# Patient Record
Sex: Male | Born: 1945 | Race: White | Hispanic: No | Marital: Married | Smoking: Never smoker
Health system: Southern US, Community
[De-identification: ages and names within clinical notes are randomized; demographics above are authoritative.]

## PROBLEM LIST (undated history)

## (undated) DIAGNOSIS — I2692 Saddle embolus of pulmonary artery without acute cor pulmonale: Secondary | ICD-10-CM

## (undated) DIAGNOSIS — I10 Essential (primary) hypertension: Secondary | ICD-10-CM

## (undated) DIAGNOSIS — Z77090 Contact with and (suspected) exposure to asbestos: Secondary | ICD-10-CM

## (undated) DIAGNOSIS — C229 Malignant neoplasm of liver, not specified as primary or secondary: Secondary | ICD-10-CM

## (undated) DIAGNOSIS — E785 Hyperlipidemia, unspecified: Secondary | ICD-10-CM

## (undated) DIAGNOSIS — H269 Unspecified cataract: Secondary | ICD-10-CM

## (undated) DIAGNOSIS — K219 Gastro-esophageal reflux disease without esophagitis: Secondary | ICD-10-CM

## (undated) DIAGNOSIS — D649 Anemia, unspecified: Secondary | ICD-10-CM

## (undated) DIAGNOSIS — M1990 Unspecified osteoarthritis, unspecified site: Secondary | ICD-10-CM

## (undated) DIAGNOSIS — I2699 Other pulmonary embolism without acute cor pulmonale: Secondary | ICD-10-CM

## (undated) HISTORY — DX: Essential (primary) hypertension: I10

## (undated) HISTORY — DX: Unspecified cataract: H26.9

## (undated) HISTORY — DX: Saddle embolus of pulmonary artery without acute cor pulmonale: I26.92

## (undated) HISTORY — PX: RADIOFREQUENCY ABLATION LIVER TUMOR: SHX2293

## (undated) HISTORY — DX: Contact with and (suspected) exposure to asbestos: Z77.090

## (undated) HISTORY — DX: Malignant neoplasm of liver, not specified as primary or secondary: C22.9

## (undated) HISTORY — PX: CHOLECYSTECTOMY: SHX55

## (undated) HISTORY — DX: Hyperlipidemia, unspecified: E78.5

## (undated) HISTORY — PX: APPENDECTOMY: SHX54

## (undated) HISTORY — PX: OTHER SURGICAL HISTORY: SHX169

## (undated) HISTORY — PX: CARPAL TUNNEL WITH CUBITAL TUNNEL: SHX5608

## (undated) HISTORY — PX: KNEE SURGERY: SHX244

## (undated) HISTORY — DX: Other pulmonary embolism without acute cor pulmonale: I26.99

---

## 1998-04-11 ENCOUNTER — Ambulatory Visit (HOSPITAL_COMMUNITY): Admission: RE | Admit: 1998-04-11 | Discharge: 1998-04-11 | Payer: Self-pay | Admitting: Internal Medicine

## 2010-03-02 ENCOUNTER — Encounter (HOSPITAL_COMMUNITY)
Admission: RE | Admit: 2010-03-02 | Discharge: 2010-03-13 | Payer: Self-pay | Source: Home / Self Care | Attending: Oncology | Admitting: Oncology

## 2010-03-02 ENCOUNTER — Ambulatory Visit (HOSPITAL_COMMUNITY)
Admission: RE | Admit: 2010-03-02 | Discharge: 2010-03-13 | Payer: Self-pay | Source: Home / Self Care | Attending: Oncology | Admitting: Oncology

## 2010-03-05 LAB — DIFFERENTIAL
Basophils Relative: 0 % (ref 0–1)
Eosinophils Absolute: 0 10*3/uL (ref 0.0–0.7)
Eosinophils Relative: 0 % (ref 0–5)
Lymphs Abs: 1.1 10*3/uL (ref 0.7–4.0)
Monocytes Relative: 12 % (ref 3–12)

## 2010-03-05 LAB — CBC
MCH: 34.2 pg — ABNORMAL HIGH (ref 26.0–34.0)
MCV: 94.9 fL (ref 78.0–100.0)
Platelets: 228 10*3/uL (ref 150–400)
RDW: 12.5 % (ref 11.5–15.5)
WBC: 8.2 10*3/uL (ref 4.0–10.5)

## 2010-03-05 LAB — COMPREHENSIVE METABOLIC PANEL
Albumin: 4.6 g/dL (ref 3.5–5.2)
BUN: 16 mg/dL (ref 6–23)
Creatinine, Ser: 0.94 mg/dL (ref 0.4–1.5)
GFR calc Af Amer: >60 mL/min (ref >60)
Total Protein: 7.4 g/dL (ref 6.0–8.3)

## 2010-03-05 LAB — AFP TUMOR MARKER: AFP-Tumor Marker: 2.5 ng/mL (ref 0.0–8.0)

## 2010-03-05 LAB — CEA: CEA: <0.5 ng/mL (ref 0.0–5.0)

## 2010-03-05 LAB — CANCER ANTIGEN 19-9: CA 19-9: 9 U/mL — ABNORMAL LOW (ref <35.0)

## 2010-03-08 ENCOUNTER — Ambulatory Visit (HOSPITAL_COMMUNITY)
Admission: RE | Admit: 2010-03-08 | Discharge: 2010-03-08 | Payer: Self-pay | Source: Home / Self Care | Attending: Oncology | Admitting: Oncology

## 2010-03-08 LAB — GLUCOSE, CAPILLARY: Glucose-Capillary: 112 mg/dL — ABNORMAL HIGH (ref 70–99)

## 2010-03-09 ENCOUNTER — Ambulatory Visit (HOSPITAL_COMMUNITY): Admission: RE | Admit: 2010-03-09 | Payer: Self-pay | Source: Home / Self Care | Admitting: Oncology

## 2010-03-14 ENCOUNTER — Ambulatory Visit (HOSPITAL_COMMUNITY)
Admission: RE | Admit: 2010-03-14 | Discharge: 2010-03-14 | Disposition: A | Discharge status: Home / Self Care | Payer: 59 | Attending: Internal Medicine | Admitting: Internal Medicine

## 2010-03-14 ENCOUNTER — Encounter (HOSPITAL_BASED_OUTPATIENT_CLINIC_OR_DEPARTMENT_OTHER): Payer: 59 | Admitting: Internal Medicine

## 2010-03-14 ENCOUNTER — Other Ambulatory Visit (INDEPENDENT_AMBULATORY_CARE_PROVIDER_SITE_OTHER): Payer: Self-pay | Admitting: Internal Medicine

## 2010-03-14 DIAGNOSIS — K2970 Gastritis, unspecified, without bleeding: Secondary | ICD-10-CM | POA: Insufficient documentation

## 2010-03-14 DIAGNOSIS — Z79899 Other long term (current) drug therapy: Secondary | ICD-10-CM | POA: Insufficient documentation

## 2010-03-14 DIAGNOSIS — D126 Benign neoplasm of colon, unspecified: Secondary | ICD-10-CM

## 2010-03-14 DIAGNOSIS — I10 Essential (primary) hypertension: Secondary | ICD-10-CM | POA: Insufficient documentation

## 2010-03-14 DIAGNOSIS — C228 Malignant neoplasm of liver, primary, unspecified as to type: Secondary | ICD-10-CM

## 2010-03-14 DIAGNOSIS — E785 Hyperlipidemia, unspecified: Secondary | ICD-10-CM | POA: Insufficient documentation

## 2010-03-14 DIAGNOSIS — K2960 Other gastritis without bleeding: Secondary | ICD-10-CM

## 2010-03-15 LAB — H. PYLORI ANTIBODY, IGG: H Pylori IgG: <0.4 {ISR}

## 2010-03-24 NOTE — Op Note (Signed)
NAME:  Grant Cummings, Grant Cummings                ACCOUNT NO.:  [id number]  MEDICAL RECORD NO.:  [id number]           PATIENT TYPE:  O  LOCATION:  DAY                           FACILITY:  [facility]  PHYSICIAN:  Lionel December, M.D.    DATE OF BIRTH:  [birth year]  DATE OF PROCEDURE: DATE OF DISCHARGE:                              OPERATIVE REPORT   PROCEDURE:  Esophagogastroduodenoscopy followed by colonoscopy with snare polypectomy.  INDICATIONS:  Grant Cummings is 65 year old Caucasian male who has history of asbestos exposure.  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  No primary has been located.  The serum markers have been negative.  He is undergoing diagnostic EGD and a colonoscopy looking for primary.  The patient reports his last colonoscopy was over 4 years ago and was normal.  Procedures were reviewed with the patient.  Informed consent was obtained.  MEDS FOR CONSCIOUS SEDATION:  Cetacaine spray for oropharyngeal topical anesthesia, Demerol 50 mg IV, Versed 5 mg IV.  FINDINGS:  Procedure performed in endoscopy suite.  The patient's vital signs and O2 sat were monitored during the procedure and remained stable.  PROCEDURES: 1. Esophagogastroduodenoscopy.  The patient was placed in left lateral     recumbent position and Pentax videoscope was passed through     oropharynx without any difficulty into esophagus. 2. Esophagus.  Mucosa of the esophagus normal.  GE junction located at     39 cm from the incisors and revealed some focal erythema. 3. Stomach.  It was empty and distended very well by insufflation.     Folds of proximal stomach are normal.  Examination of mucosa and     body was normal.  There were few antral erosions.  Pyloric channel     was patent.  Angularis, fundus, and cardia were examined by     retroflexion of scope and were normal. 4. Duodenum.  Bulbar mucosa was normal.  Scope was passed in second     part of  duodenum where mucosa and folds were normal.  Endoscope was     withdrawn.  The patient prepared for procedure #2. 5. Colonoscopy.  The patient was placed in left lateral recumbent     position and rectal examination performed.  No abnormality noted on     external or digital exam.  Pentax videoscope was placed through     rectum and advanced under vision into sigmoid colon and beyond.     Preparation was excellent.  Scope was passed into cecum, which was     identified by appendiceal orifice and ileocecal valve.  Short     segment of GI was also examined was normal.  There was about 8 x 10     mm sessile polyp at the right edge of ileocecal valve.  This was     snared in two pieces.  Polypectomy was complete.  There were two     other sessile polyps at ascending colon, one was about 7-8 mm wide     and other one was about 12  mm.  Both of these were elevated with     saline injection and stent.  Polypectomy was complete.  Both of     these polyps were removed, the larger one with [employer].  There     were two other small polyps, one at the ascending colon, the other     one at rectum and these were coagulated using snare tip.  Rest of     the mucosa was normal.  Rectal mucosa similarly was normal.  Scope     was retroflexed to examine anorectal junction which was     unremarkable.  Endoscope was withdrawn.  Withdrawal time was 27     minutes.  The patient tolerated the procedures well.  FINAL DIAGNOSES: 1. Mild changes of reflux esophagitis, limited to GE junction. 2. Erosive antral gastritis. 3. Normal terminal ileum. 4. Three sessile polyps snared, one from the cecum, and two from     ascending colon, the largest one was about 12 mm.  Endoscopically,     did not look suspicious for carcinoma. 5. Two small polyps coagulated using snare one at the ascending colon,     second one at the rectum.  RECOMMENDATIONS: 1. Antireflux measures. 2. Pantoprazole 40 mg p.o. q.a.m. prescription  given for 35 refills. 3. No aspirin for 2 weeks. 4. We will check his H. pylori serology and I will be contacting the     patient with results of biopsy and blood test.     Lionel December, M.D.     NR/MEDQ  D:  03/14/2010  T:  03/14/2010  Job:  [id number]  cc:   Ladona Horns. Mariel Sleet, MD Fax: [phone]  Ernestina Penna, M.D. Fax: [phone]  Electronically Signed by Lionel December M.D. on 03/18/2010 01:56:05 PM

## 2012-07-09 ENCOUNTER — Other Ambulatory Visit: Payer: Self-pay

## 2012-07-09 MED ORDER — BENAZEPRIL HCL 20 MG PO TABS: 20.0000 mg | ORAL_TABLET | Freq: Every day | ORAL | Status: DC

## 2012-07-09 MED ORDER — HYDROCHLOROTHIAZIDE 25 MG PO TABS: 25.0000 mg | ORAL_TABLET | Freq: Every day | ORAL | Status: DC

## 2012-07-09 MED ORDER — PANTOPRAZOLE SODIUM 40 MG PO TBEC: 40.0000 mg | DELAYED_RELEASE_TABLET | Freq: Every day | ORAL | Status: DC

## 2012-07-09 NOTE — Telephone Encounter (Signed)
Patient last seen 11/13

## 2012-07-27 ENCOUNTER — Other Ambulatory Visit: Payer: Self-pay | Admitting: *Deleted

## 2012-07-27 MED ORDER — AMLODIPINE BESYLATE 10 MG PO TABS: 10.0000 mg | ORAL_TABLET | Freq: Every day | ORAL | Status: DC

## 2012-08-10 ENCOUNTER — Other Ambulatory Visit: Payer: Self-pay

## 2012-08-10 MED ORDER — AMLODIPINE BESYLATE 10 MG PO TABS: 10.0000 mg | ORAL_TABLET | Freq: Every day | ORAL | Status: DC

## 2012-08-10 MED ORDER — HYDROCHLOROTHIAZIDE 25 MG PO TABS: 25.0000 mg | ORAL_TABLET | Freq: Every day | ORAL | Status: DC

## 2012-08-10 MED ORDER — PANTOPRAZOLE SODIUM 40 MG PO TBEC: 40.0000 mg | DELAYED_RELEASE_TABLET | Freq: Every day | ORAL | Status: DC

## 2012-08-10 MED ORDER — BENAZEPRIL HCL 20 MG PO TABS: 20.0000 mg | ORAL_TABLET | Freq: Every day | ORAL | Status: DC

## 2012-08-10 NOTE — Telephone Encounter (Signed)
Last seen 01/02/12   DWM

## 2012-09-28 ENCOUNTER — Other Ambulatory Visit: Payer: Self-pay | Admitting: *Deleted

## 2012-09-28 MED ORDER — AMLODIPINE BESYLATE 10 MG PO TABS: 10.0000 mg | ORAL_TABLET | Freq: Every day | ORAL | Status: DC

## 2012-09-28 NOTE — Telephone Encounter (Signed)
LAST RF 11/13

## 2012-10-13 ENCOUNTER — Other Ambulatory Visit: Payer: Self-pay

## 2012-10-13 NOTE — Telephone Encounter (Signed)
Last seen 01/02/12   DWM  

## 2012-10-13 NOTE — Telephone Encounter (Signed)
Pt notified and appt given for sept 4 2014

## 2012-10-13 NOTE — Telephone Encounter (Signed)
Need to be seen. Bring all medications with appointment.

## 2012-10-15 ENCOUNTER — Ambulatory Visit (INDEPENDENT_AMBULATORY_CARE_PROVIDER_SITE_OTHER): Payer: Medicare Other | Admitting: Family Medicine

## 2012-10-15 ENCOUNTER — Encounter: Payer: Self-pay | Admitting: Family Medicine

## 2012-10-15 VITALS — BP 133/73 | HR 70 | Temp 98.4°F | Wt 214.0 lb

## 2012-10-15 DIAGNOSIS — K219 Gastro-esophageal reflux disease without esophagitis: Secondary | ICD-10-CM

## 2012-10-15 DIAGNOSIS — C229 Malignant neoplasm of liver, not specified as primary or secondary: Secondary | ICD-10-CM | POA: Insufficient documentation

## 2012-10-15 DIAGNOSIS — E785 Hyperlipidemia, unspecified: Secondary | ICD-10-CM | POA: Insufficient documentation

## 2012-10-15 DIAGNOSIS — I10 Essential (primary) hypertension: Secondary | ICD-10-CM | POA: Insufficient documentation

## 2012-10-15 MED ORDER — PANTOPRAZOLE SODIUM 40 MG PO TBEC: 40.0000 mg | DELAYED_RELEASE_TABLET | Freq: Every day | ORAL | Status: DC

## 2012-10-15 MED ORDER — AMLODIPINE BESYLATE 10 MG PO TABS: 10.0000 mg | ORAL_TABLET | Freq: Every day | ORAL | Status: DC

## 2012-10-15 MED ORDER — ATORVASTATIN CALCIUM 40 MG PO TABS: 40.0000 mg | ORAL_TABLET | Freq: Every day | ORAL | Status: DC

## 2012-10-15 MED ORDER — BENAZEPRIL HCL 20 MG PO TABS: 20.0000 mg | ORAL_TABLET | Freq: Every day | ORAL | Status: DC

## 2012-10-15 MED ORDER — HYDROCHLOROTHIAZIDE 25 MG PO TABS: 25.0000 mg | ORAL_TABLET | Freq: Every day | ORAL | Status: DC

## 2012-10-15 NOTE — Progress Notes (Signed)
Patient ID: Grant Cummings, male   DOB: 01/20/1946, 67 y.o.   MRN: [id number] SUBJECTIVE: CC: Chief Complaint  Patient presents with  . Medication Refill    refill all meds  no problems     HPI: Patient is here for follow up of hyperlipidemia: denies Headache;denies Chest Pain;denies weakness;denies Shortness of Breath and orthopnea;denies Visual changes;denies palpitations;denies cough;denies pedal edema;denies symptoms of TIA or stroke;deniesClaudication symptoms. admits to Compliance with medications; denies Problems with medications.  Past Medical History  Diagnosis Date  . Liver cancer   . Hyperlipidemia   . Hypertension    No past surgical history on file. History   Social History  . Marital Status: Married    Spouse Name: N/A    Number of Children: N/A  . Years of Education: N/A   Occupational History  . Not on file.   Social History Main Topics  . Smoking status: Never Smoker   . Smokeless tobacco: Not on file  . Alcohol Use: Not on file  . Drug Use: Not on file  . Sexual Activity: Not on file   Other Topics Concern  . Not on file   Social History Narrative  . No narrative on file   No family history on file. No current outpatient prescriptions on file prior to visit.   No current facility-administered medications on file prior to visit.   No Known Allergies  There is no immunization history on file for this patient. Prior to Admission medications   Medication Sig Start Date End Date Taking? Authorizing Provider  amLODipine (NORVASC) 10 MG tablet Take 1 tablet (10 mg total) by mouth daily. 10/15/12  Yes Ileana Ladd, MD  atorvastatin (LIPITOR) 40 MG tablet Take 1 tablet (40 mg total) by mouth daily. 10/15/12  Yes Ileana Ladd, MD  benazepril (LOTENSIN) 20 MG tablet Take 1 tablet (20 mg total) by mouth daily. 10/15/12  Yes Ileana Ladd, MD  hydrochlorothiazide (HYDRODIURIL) 25 MG tablet Take 1 tablet (25 mg total) by mouth daily. 10/15/12  Yes Ileana Ladd, MD  pantoprazole (PROTONIX) 40 MG tablet Take 1 tablet (40 mg total) by mouth daily. 10/15/12  Yes Ileana Ladd, MD     ROS: As above in the HPI. All other systems are stable or negative.  OBJECTIVE: APPEARANCE:  Patient in no acute distress.The patient appeared well nourished and normally developed. Acyanotic. Waist: VITAL SIGNS:BP 133/73  Pulse 70  Temp(Src) 98.4 F (36.9 C) (Oral)  Wt 214 lb (97.07 kg) WM  SKIN: warm and  Dry without overt rashes, tattoos and scars  HEAD and Neck: without JVD, Head and scalp: normal Eyes:No scleral icterus. Fundi normal, eye movements normal. Ears: Auricle normal, canal normal, Tympanic membranes normal, insufflation normal. Nose: normal Throat: normal Neck & thyroid: normal  CHEST & LUNGS: Chest wall: normal Lungs: Clear  CVS: Reveals the PMI to be normally located. Regular rhythm, First and Second Heart sounds are normal,  absence of murmurs, rubs or gallops. Peripheral vasculature: Radial pulses: normal Dorsal pedis pulses: normal Posterior pulses: normal  ABDOMEN:  Appearance: normal Benign, no organomegaly, no masses, no Abdominal Aortic enlargement. No Guarding , no rebound. No Bruits. Bowel sounds: normal  RECTAL: N/A GU: N/A  EXTREMETIES: nonedematous.  MUSCULOSKELETAL:  Spine: normal Joints: intact  NEUROLOGIC: oriented to time,place and person; nonfocal. Strength is normal Sensory is normal Reflexes are normal Cranial Nerves are normal.  ASSESSMENT: Hyperlipidemia - Plan: atorvastatin (LIPITOR) 40 MG tablet, CMP14+EGFR, NMR, lipoprofile  Hypertension - Plan: amLODipine (NORVASC) 10 MG tablet, hydrochlorothiazide (HYDRODIURIL) 25 MG tablet, benazepril (LOTENSIN) 20 MG tablet  Liver cancer - Plan: pantoprazole (PROTONIX) 40 MG tablet  GERD (gastroesophageal reflux disease)   PLAN: Orders Placed This Encounter  Procedures  . CMP14+EGFR  . NMR, lipoprofile   Meds ordered this encounter   Medications  . DISCONTD: amLODipine (NORVASC) 10 MG tablet    Sig: Take 10 mg by mouth daily.  Marland Kitchen DISCONTD: lisinopril (PRINIVIL,ZESTRIL) 20 MG tablet    Sig: Take 20 mg by mouth daily.  Marland Kitchen DISCONTD: atorvastatin (LIPITOR) 40 MG tablet    Sig: Take 40 mg by mouth daily.  Marland Kitchen atorvastatin (LIPITOR) 40 MG tablet    Sig: Take 1 tablet (40 mg total) by mouth daily.    Dispense:  30 tablet    Refill:  11  . amLODipine (NORVASC) 10 MG tablet    Sig: Take 1 tablet (10 mg total) by mouth daily.    Dispense:  30 tablet    Refill:  11  . pantoprazole (PROTONIX) 40 MG tablet    Sig: Take 1 tablet (40 mg total) by mouth daily.    Dispense:  30 tablet    Refill:  11  . hydrochlorothiazide (HYDRODIURIL) 25 MG tablet    Sig: Take 1 tablet (25 mg total) by mouth daily.    Dispense:  30 tablet    Refill:  11  . benazepril (LOTENSIN) 20 MG tablet    Sig: Take 1 tablet (20 mg total) by mouth daily.    Dispense:  30 tablet    Refill:  11   Discussed a healthy, balanced , lifestyle.  Return in about 4 months (around 02/14/2013) for Recheck medical problems with Dr Christell Constant.  Lexandra Rettke P. [place] Charon, M.D.

## 2012-10-17 LAB — CMP14+EGFR
ALT: 32 IU/L (ref 0–44)
AST: 30 IU/L (ref 0–40)
Albumin/Globulin Ratio: 2.4 (ref 1.1–2.5)
Albumin: 4.7 g/dL (ref 3.6–4.8)
Alkaline Phosphatase: 69 IU/L (ref 39–117)
BUN/Creatinine Ratio: 19 (ref 10–22)
BUN: 22 mg/dL (ref 8–27)
CO2: 24 mmol/L (ref 18–29)
Calcium: 9.8 mg/dL (ref 8.6–10.2)
Chloride: 98 mmol/L (ref 97–108)
Creatinine, Ser: 1.14 mg/dL (ref 0.76–1.27)
GFR calc Af Amer: 77 mL/min/{1.73_m2} (ref >59)
GFR calc non Af Amer: 67 mL/min/{1.73_m2} (ref >59)
Globulin, Total: 2 g/dL (ref 1.5–4.5)
Glucose: 102 mg/dL — ABNORMAL HIGH (ref 65–99)
Potassium: 4.2 mmol/L (ref 3.5–5.2)
Sodium: 140 mmol/L (ref 134–144)
Total Bilirubin: 0.6 mg/dL (ref 0.0–1.2)
Total Protein: 6.7 g/dL (ref 6.0–8.5)

## 2012-10-17 LAB — NMR, LIPOPROFILE
Cholesterol: 136 mg/dL (ref <200)
HDL Cholesterol by NMR: 42 mg/dL (ref >=40)
HDL Particle Number: 34.5 umol/L (ref >=30.5)
LDL Particle Number: 1214 nmol/L — ABNORMAL HIGH (ref <1000)
LDL Size: 20.1 nm — ABNORMAL LOW (ref >20.5)
LDLC SERPL CALC-MCNC: 47 mg/dL (ref <100)
LP-IR Score: 73 — ABNORMAL HIGH (ref <=45)
Small LDL Particle Number: 831 nmol/L — ABNORMAL HIGH (ref <=527)
Triglycerides by NMR: 237 mg/dL — ABNORMAL HIGH (ref <150)

## 2013-03-22 ENCOUNTER — Ambulatory Visit (INDEPENDENT_AMBULATORY_CARE_PROVIDER_SITE_OTHER): Payer: Medicare Other | Admitting: Family Medicine

## 2013-03-22 ENCOUNTER — Encounter: Payer: Self-pay | Admitting: Family Medicine

## 2013-03-22 VITALS — BP 146/89 | HR 72 | Temp 97.3°F | Ht 73.0 in | Wt 211.0 lb

## 2013-03-22 DIAGNOSIS — E785 Hyperlipidemia, unspecified: Secondary | ICD-10-CM

## 2013-03-22 DIAGNOSIS — E559 Vitamin D deficiency, unspecified: Secondary | ICD-10-CM | POA: Insufficient documentation

## 2013-03-22 DIAGNOSIS — C229 Malignant neoplasm of liver, not specified as primary or secondary: Secondary | ICD-10-CM

## 2013-03-22 DIAGNOSIS — N400 Enlarged prostate without lower urinary tract symptoms: Secondary | ICD-10-CM | POA: Insufficient documentation

## 2013-03-22 DIAGNOSIS — I10 Essential (primary) hypertension: Secondary | ICD-10-CM

## 2013-03-22 DIAGNOSIS — Z0000 Encounter for general adult medical examination without abnormal findings: Secondary | ICD-10-CM

## 2013-03-22 DIAGNOSIS — H9190 Unspecified hearing loss, unspecified ear: Secondary | ICD-10-CM | POA: Insufficient documentation

## 2013-03-22 LAB — POCT URINALYSIS DIPSTICK
Bilirubin, UA: NEGATIVE
GLUCOSE UA: NEGATIVE
Ketones, UA: NEGATIVE
Leukocytes, UA: NEGATIVE
Nitrite, UA: NEGATIVE
Protein, UA: NEGATIVE
RBC UA: NEGATIVE
Spec Grav, UA: 1.01
UROBILINOGEN UA: NEGATIVE
pH, UA: 6.5

## 2013-03-22 LAB — POCT CBC
Granulocyte percent: 76.6 %G (ref 37–80)
HEMATOCRIT: 46.6 % (ref 43.5–53.7)
HEMOGLOBIN: 14.9 g/dL (ref 14.1–18.1)
Lymph, poc: 1.2 (ref 0.6–3.4)
MCH, POC: 30.5 pg (ref 27–31.2)
MCHC: 31.9 g/dL (ref 31.8–35.4)
MCV: 95.8 fL (ref 80–97)
MPV: 7.6 fL (ref 0–99.8)
POC GRANULOCYTE: 4.9 (ref 2–6.9)
POC LYMPH %: 18.9 % (ref 10–50)
Platelet Count, POC: 226 10*3/uL (ref 142–424)
RBC: 4.9 M/uL (ref 4.69–6.13)
RDW, POC: 13.4 %
WBC: 6.4 10*3/uL (ref 4.6–10.2)

## 2013-03-22 LAB — POCT UA - MICROSCOPIC ONLY
Bacteria, U Microscopic: NEGATIVE
CASTS, UR, LPF, POC: NEGATIVE
Crystals, Ur, HPF, POC: NEGATIVE
Mucus, UA: NEGATIVE
RBC, urine, microscopic: NEGATIVE
WBC, Ur, HPF, POC: NEGATIVE
Yeast, UA: NEGATIVE

## 2013-03-22 NOTE — Patient Instructions (Addendum)
Continue current medications. Continue good therapeutic lifestyle changes which include good diet and exercise. Fall precautions discussed with patient. Schedule your flu vaccine if you haven't had it yet If you are over 66 years old - you may need Prevnar 63 or the adult Pneumonia vaccine. You will need to get a Prevnar in the fall of 2015 Continue followup with physician in [place] We will call you with the results of her lab work once those results are available Stay active, tea helps the

## 2013-03-22 NOTE — Addendum Note (Signed)
Addended by: Earlene Plater on: 03/22/2013 09:18 AM   Modules accepted: Orders

## 2013-03-22 NOTE — Progress Notes (Signed)
Subjective:    Patient ID: Grant Cummings, male    DOB: Nov 27, 1945, 68 y.o.   MRN: [id number]  HPI Pt here for follow up and management of chronic medical problems. Is being followed at [facility] in [place] for liver cancer. He has had more surgery with removal of more cancer from the liver in October 2014. He gets regular CT scans by his physician there. He looks good feels good today and comes in for his regular exam.        Patient Active Problem List   Diagnosis Date Noted  . Liver cancer   . Hyperlipidemia   . Hypertension    Outpatient Encounter Prescriptions as of 03/22/2013  Medication Sig  . amLODipine (NORVASC) 10 MG tablet Take 1 tablet (10 mg total) by mouth daily.  Marland Kitchen aspirin 81 MG tablet Take 81 mg by mouth daily.  Marland Kitchen atorvastatin (LIPITOR) 40 MG tablet Take 1 tablet (40 mg total) by mouth daily.  . benazepril (LOTENSIN) 20 MG tablet Take 1 tablet (20 mg total) by mouth daily.  Marland Kitchen co-enzyme Q-10 30 MG capsule Take 30 mg by mouth daily.  . hydrochlorothiazide (HYDRODIURIL) 25 MG tablet Take 1 tablet (25 mg total) by mouth daily.  . Omega 3 1000 MG CAPS Take 1 capsule by mouth daily.  . pantoprazole (PROTONIX) 40 MG tablet Take 1 tablet (40 mg total) by mouth daily.    Review of Systems  Constitutional: Negative.   HENT: Negative.   Eyes: Negative.   Respiratory: Negative.   Cardiovascular: Negative.   Gastrointestinal: Negative.   Endocrine: Negative.   Genitourinary: Negative.   Musculoskeletal: Negative.   Skin: Negative.   Allergic/Immunologic: Negative.   Neurological: Negative.   Hematological: Negative.   Psychiatric/Behavioral: Negative.        Objective:   Physical Exam  Nursing note and vitals reviewed. Constitutional: He is oriented to person, place, and time. He appears well-developed and well-nourished.  HENT:  Head: Normocephalic and atraumatic.  Right Ear: External ear normal.  Left Ear: External ear normal.  Nose: Nose  normal.  Mouth/Throat: Oropharynx is clear and moist. No oropharyngeal exudate.  He uses a hearing aid in the right ear.  Eyes: Conjunctivae and EOM are normal. Pupils are equal, round, and reactive to light. Right eye exhibits no discharge. Left eye exhibits no discharge. No scleral icterus.  Neck: Normal range of motion. Neck supple. No tracheal deviation present. No thyromegaly present.  No carotid bruits  Cardiovascular: Normal rate, regular rhythm, normal heart sounds and intact distal pulses.  Exam reveals no gallop and no friction rub.   No murmur heard. At 72 per minute  Pulmonary/Chest: Effort normal and breath sounds normal. No respiratory distress. He has no wheezes. He has no rales. He exhibits no tenderness.  No axillary nodes  Abdominal: Soft. Bowel sounds are normal. He exhibits no distension and no mass. There is no tenderness. There is no rebound and no guarding.  No inguinal nodes  Genitourinary: Rectum normal and penis normal.  The prostate gland is slightly enlarged without any lumps masses or nodules. The rectal exam was negative. There was no inguinal hernia. The external genitalia were normal.  Musculoskeletal: Normal range of motion. He exhibits no edema and no tenderness.  Lymphadenopathy:    He has no cervical adenopathy.  Neurological: He is alert and oriented to person, place, and time. He has normal reflexes. No cranial nerve deficit.  Skin: Skin is warm and dry. No rash  noted. No erythema. No pallor.  Psychiatric: He has a normal mood and affect. His behavior is normal. Judgment and thought content normal.   BP 146/89  Pulse 72  Temp(Src) 97.3 F (36.3 C) (Oral)  Ht $R'6\' 1"'rC$  (1.854 m)  Wt 211 lb (95.709 kg)  BMI 27.84 kg/m2        Assessment & Plan:  1. Hyperlipidemia - POCT CBC - NMR, lipoprofile  2. Hypertension - POCT CBC - BMP8+EGFR - Hepatic function panel  3. Liver cancer - POCT CBC - Hepatic function panel  4. Annual physical exam -  PSA, total and free  5. BPH (benign prostatic hyperplasia)  6. Vitamin D deficiency - Vit D  25 hydroxy (rtn osteoporosis monitoring)  7. Hearing deficit Meds ordered this encounter  Medications  . co-enzyme Q-10 30 MG capsule    Sig: Take 30 mg by mouth daily.  . Omega 3 1000 MG CAPS    Sig: Take 1 capsule by mouth daily.  Marland Kitchen aspirin 81 MG tablet    Sig: Take 81 mg by mouth daily.   Patient Instructions  Continue current medications. Continue good therapeutic lifestyle changes which include good diet and exercise. Fall precautions discussed with patient. Schedule your flu vaccine if you haven't had it yet If you are over 73 years old - you may need Prevnar 68 or the adult Pneumonia vaccine. You will need to get a Prevnar in the fall of 2015 Continue followup with physician in [place] We will call you with the results of her lab work once those results are available Stay active, tea helps the   Arrie Senate MD

## 2013-03-24 LAB — BMP8+EGFR
BUN/Creatinine Ratio: 20 (ref 10–22)
BUN: 18 mg/dL (ref 8–27)
CALCIUM: 9.5 mg/dL (ref 8.6–10.2)
CO2: 25 mmol/L (ref 18–29)
CREATININE: 0.92 mg/dL (ref 0.76–1.27)
Chloride: 102 mmol/L (ref 97–108)
GFR, EST AFRICAN AMERICAN: 99 mL/min/{1.73_m2} (ref >59)
GFR, EST NON AFRICAN AMERICAN: 86 mL/min/{1.73_m2} (ref >59)
GLUCOSE: 105 mg/dL — AB (ref 65–99)
POTASSIUM: 4 mmol/L (ref 3.5–5.2)
Sodium: 142 mmol/L (ref 134–144)

## 2013-03-24 LAB — NMR, LIPOPROFILE
Cholesterol: 127 mg/dL (ref <200)
HDL CHOLESTEROL BY NMR: 47 mg/dL (ref >=40)
HDL Particle Number: 31.2 umol/L (ref >=30.5)
LDL PARTICLE NUMBER: 1015 nmol/L — AB (ref <1000)
LDL Size: 20.5 nm — ABNORMAL LOW (ref >20.5)
LDLC SERPL CALC-MCNC: 66 mg/dL (ref <100)
LP-IR SCORE: 40 (ref <=45)
Small LDL Particle Number: 419 nmol/L (ref <=527)
Triglycerides by NMR: 69 mg/dL (ref <150)

## 2013-03-24 LAB — HEPATIC FUNCTION PANEL
ALK PHOS: 68 IU/L (ref 39–117)
ALT: 31 IU/L (ref 0–44)
AST: 27 IU/L (ref 0–40)
Albumin: 4.5 g/dL (ref 3.6–4.8)
Bilirubin, Direct: 0.17 mg/dL (ref 0.00–0.40)
Total Bilirubin: 0.6 mg/dL (ref 0.0–1.2)
Total Protein: 6.5 g/dL (ref 6.0–8.5)

## 2013-03-24 LAB — PSA, TOTAL AND FREE
PSA FREE PCT: 30 %
PSA, Free: 0.06 ng/mL
PSA: 0.2 ng/mL (ref 0.0–4.0)

## 2013-03-24 LAB — VITAMIN D 25 HYDROXY (VIT D DEFICIENCY, FRACTURES): VIT D 25 HYDROXY: 24.4 ng/mL — AB (ref 30.0–100.0)

## 2013-03-30 ENCOUNTER — Telehealth: Payer: Self-pay | Admitting: *Deleted

## 2013-03-30 NOTE — Telephone Encounter (Signed)
Message copied by Marin Olp on Tue Mar 30, 2013  1:23 PM ------      Message from: Chipper Herb      Created: Wed Mar 24, 2013  6:55 AM       The blood sugar slightly elevated at 105. The creatinine, electrolytes including potassium were within normal limits      The liver function tests are all within normal limits      On advanced lipid testing, the LDL particle number is slightly elevated at 1015, this is improved from 5 months ago. The LDL C. is good, triglycerides are good and the good cholesterol is within the normal range. Continue current treatment and as aggressive therapeutic lifestyle changes as possible      The PSA is low and within normal limits      The vitamin D level is very low at 24.4---------------------- please call in vitamin D 50,000 units #12 one weekly one refill. Have him repeat the vitamin D level in 3 months and this can be done nonfasting ------

## 2013-03-31 NOTE — Telephone Encounter (Signed)
Patient aware.

## 2013-04-16 ENCOUNTER — Ambulatory Visit: Payer: Medicare Other | Admitting: Family Medicine

## 2013-09-20 ENCOUNTER — Encounter: Payer: Self-pay | Admitting: Family Medicine

## 2013-09-20 ENCOUNTER — Ambulatory Visit (INDEPENDENT_AMBULATORY_CARE_PROVIDER_SITE_OTHER): Payer: Medicare Other | Admitting: Family Medicine

## 2013-09-20 ENCOUNTER — Encounter (INDEPENDENT_AMBULATORY_CARE_PROVIDER_SITE_OTHER): Payer: Self-pay

## 2013-09-20 VITALS — BP 141/84 | HR 69 | Temp 96.3°F | Ht 73.0 in | Wt 216.0 lb

## 2013-09-20 DIAGNOSIS — E559 Vitamin D deficiency, unspecified: Secondary | ICD-10-CM

## 2013-09-20 DIAGNOSIS — I10 Essential (primary) hypertension: Secondary | ICD-10-CM

## 2013-09-20 DIAGNOSIS — L209 Atopic dermatitis, unspecified: Secondary | ICD-10-CM

## 2013-09-20 DIAGNOSIS — L2089 Other atopic dermatitis: Secondary | ICD-10-CM

## 2013-09-20 DIAGNOSIS — E785 Hyperlipidemia, unspecified: Secondary | ICD-10-CM

## 2013-09-20 DIAGNOSIS — N400 Enlarged prostate without lower urinary tract symptoms: Secondary | ICD-10-CM

## 2013-09-20 LAB — POCT CBC
Granulocyte percent: 74.2 %G (ref 37–80)
HCT, POC: 45.1 % (ref 43.5–53.7)
Hemoglobin: 15.2 g/dL (ref 14.1–18.1)
Lymph, poc: 1 (ref 0.6–3.4)
MCH: 32.9 pg — AB (ref 27–31.2)
MCHC: 33.7 g/dL (ref 31.8–35.4)
MCV: 97.7 fL — AB (ref 80–97)
MPV: 7.2 fL (ref 0–99.8)
PLATELET COUNT, POC: 179 10*3/uL (ref 142–424)
POC Granulocyte: 3.9 (ref 2–6.9)
POC LYMPH PERCENT: 18.4 %L (ref 10–50)
RBC: 4.6 M/uL — AB (ref 4.69–6.13)
RDW, POC: 12.6 %
WBC: 5.2 10*3/uL (ref 4.6–10.2)

## 2013-09-20 MED ORDER — PANTOPRAZOLE SODIUM 40 MG PO TBEC: 40.0000 mg | DELAYED_RELEASE_TABLET | Freq: Every day | ORAL | Status: DC

## 2013-09-20 MED ORDER — FLUOCINONIDE-E 0.05 % EX CREA: 1.0000 "application " | TOPICAL_CREAM | Freq: Two times a day (BID) | CUTANEOUS | Status: DC

## 2013-09-20 MED ORDER — BENAZEPRIL HCL 20 MG PO TABS: 20.0000 mg | ORAL_TABLET | Freq: Every day | ORAL | Status: DC

## 2013-09-20 MED ORDER — HYDROCHLOROTHIAZIDE 25 MG PO TABS: 25.0000 mg | ORAL_TABLET | Freq: Every day | ORAL | Status: DC

## 2013-09-20 MED ORDER — AMLODIPINE BESYLATE 10 MG PO TABS: 10.0000 mg | ORAL_TABLET | Freq: Every day | ORAL | Status: DC

## 2013-09-20 MED ORDER — ATORVASTATIN CALCIUM 40 MG PO TABS: 40.0000 mg | ORAL_TABLET | Freq: Every day | ORAL | Status: DC

## 2013-09-20 NOTE — Progress Notes (Signed)
Subjective:    Patient ID: Grant Cummings, male    DOB: 1945/03/02, 68 y.o.   MRN: [id number]  HPI Pt here for follow up and management of chronic medical problems. It is important to note this patient has a history of liver cancer and is being followed regularly by [facility]. He gets a CT scan every 6 months. The last was done in May of this year. Today he is due for FOBT and lab work. All his refills were taking care of also. He complains of a rash today          Patient Active Problem List   Diagnosis Date Noted  . BPH (benign prostatic hyperplasia) 03/22/2013  . Vitamin D deficiency 03/22/2013  . Hearing deficit 03/22/2013  . Liver cancer   . Hyperlipidemia   . Hypertension    Outpatient Encounter Prescriptions as of 09/20/2013  Medication Sig  . amLODipine (NORVASC) 10 MG tablet Take 1 tablet (10 mg total) by mouth daily.  Marland Kitchen aspirin 81 MG tablet Take 81 mg by mouth daily.  Marland Kitchen atorvastatin (LIPITOR) 40 MG tablet Take 1 tablet (40 mg total) by mouth daily.  . benazepril (LOTENSIN) 20 MG tablet Take 1 tablet (20 mg total) by mouth daily.  . cholecalciferol (VITAMIN D) 1000 UNITS tablet Take 1,000 Units by mouth daily.  Marland Kitchen co-enzyme Q-10 30 MG capsule Take 30 mg by mouth daily.  . hydrochlorothiazide (HYDRODIURIL) 25 MG tablet Take 1 tablet (25 mg total) by mouth daily.  . Multiple Vitamin (MULTIVITAMIN WITH MINERALS) TABS tablet Take 1 tablet by mouth daily.  . Omega 3 1000 MG CAPS Take 1 capsule by mouth daily.  . pantoprazole (PROTONIX) 40 MG tablet Take 1 tablet (40 mg total) by mouth daily.    Review of Systems  Constitutional: Negative.   HENT: Negative.   Eyes: Negative.   Respiratory: Negative.   Cardiovascular: Negative.   Gastrointestinal: Negative.   Endocrine: Negative.   Musculoskeletal: Negative.   Skin: Positive for rash (on chest - first noticed 2-3 weeks ago).  Allergic/Immunologic: Negative.   Neurological: Negative.    Hematological: Negative.   Psychiatric/Behavioral: Negative.        Objective:   Physical Exam  Nursing note and vitals reviewed. Constitutional: He is oriented to person, place, and time. He appears well-developed and well-nourished. No distress.  HENT:  Head: Normocephalic and atraumatic.  Left Ear: External ear normal.  Eyes: Conjunctivae and EOM are normal. Pupils are equal, round, and reactive to light. Right eye exhibits no discharge. Left eye exhibits no discharge. No scleral icterus.  Neck: Normal range of motion. Neck supple. No thyromegaly present.  No carotid bruits auscultated  Cardiovascular: Normal rate, regular rhythm and normal heart sounds.  Exam reveals no gallop and no friction rub.   No murmur heard. At 60 per minute  Pulmonary/Chest: Effort normal and breath sounds normal. No respiratory distress. He has no wheezes. He has no rales. He exhibits no tenderness.  No axillary adenopathy  Abdominal: Soft. Bowel sounds are normal. He exhibits no mass. There is no tenderness. There is no rebound and no guarding.  No organomegaly   Musculoskeletal: Normal range of motion. He exhibits no edema.  Lymphadenopathy:    He has no cervical adenopathy.  Neurological: He is alert and oriented to person, place, and time. He has normal reflexes. No cranial nerve deficit.  Skin: Skin is warm and dry. Rash noted. No erythema. No pallor.  There  are patches of dry erythematous skin anterior to both axillary regions and also a patch on the left humerus skin.  Psychiatric: He has a normal mood and affect. His behavior is normal. Judgment and thought content normal.   BP 141/84  Pulse 69  Temp(Src) 96.3 F (35.7 C) (Oral)  Ht _0  (1.854 m)  Wt 216 lb (97.977 kg)  BMI 28.50 kg/m2        Assessment & Plan:  1. Hyperlipidemia - POCT CBC - NMR, lipoprofile - atorvastatin (LIPITOR) 40 MG tablet; Take 1 tablet (40 mg total) by mouth daily.  Dispense: 30 tablet; Refill: 11  2.  Essential hypertension - POCT CBC - BMP8+EGFR - Hepatic function panel - hydrochlorothiazide (HYDRODIURIL) 25 MG tablet; Take 1 tablet (25 mg total) by mouth daily.  Dispense: 30 tablet; Refill: 11 - benazepril (LOTENSIN) 20 MG tablet; Take 1 tablet (20 mg total) by mouth daily.  Dispense: 30 tablet; Refill: 11 - amLODipine (NORVASC) 10 MG tablet; Take 1 tablet (10 mg total) by mouth daily.  Dispense: 30 tablet; Refill: 11  3. Vitamin D deficiency - POCT CBC - Vit D  25 hydroxy (rtn osteoporosis monitoring)  4. BPH (benign prostatic hyperplasia) - POCT CBC  5. Atopic dermatitis . Meds ordered this encounter  Medications  . Multiple Vitamin (MULTIVITAMIN WITH MINERALS) TABS tablet    Sig: Take 1 tablet by mouth daily.  . cholecalciferol (VITAMIN D) 1000 UNITS tablet    Sig: Take 1,000 Units by mouth daily.  Marland Kitchen atorvastatin (LIPITOR) 40 MG tablet    Sig: Take 1 tablet (40 mg total) by mouth daily.    Dispense:  30 tablet    Refill:  11  . hydrochlorothiazide (HYDRODIURIL) 25 MG tablet    Sig: Take 1 tablet (25 mg total) by mouth daily.    Dispense:  30 tablet    Refill:  11  . benazepril (LOTENSIN) 20 MG tablet    Sig: Take 1 tablet (20 mg total) by mouth daily.    Dispense:  30 tablet    Refill:  11  . amLODipine (NORVASC) 10 MG tablet    Sig: Take 1 tablet (10 mg total) by mouth daily.    Dispense:  30 tablet    Refill:  11  . pantoprazole (PROTONIX) 40 MG tablet    Sig: Take 1 tablet (40 mg total) by mouth daily.    Dispense:  30 tablet    Refill:  11  . fluocinonide-emollient (LIDEX-E) 0.05 % cream    Sig: Apply 1 application topically 2 (two) times daily. Use sparingly    Dispense:  30 g    Refill:  1   Patient Instructions                       Medicare Annual Wellness Visit  [facility] and the medical providers at [facility] strive to bring you the best medical care.  In doing so we not only want to address your current medical  conditions and concerns but also to detect new conditions early and prevent illness, disease and health-related problems.    Medicare offers a yearly Wellness Visit which allows our clinical staff to assess your need for preventative services including immunizations, lifestyle education, counseling to decrease risk of preventable diseases and screening for fall risk and other medical concerns.    This visit is provided free of charge (no copay) for all Medicare recipients. The clinical pharmacists at  [facility] have begun to conduct these Wellness Visits which will also include a thorough review of all your medications.    As you primary medical provider recommend that you make an appointment for your Annual Wellness Visit if you have not done so already this year.  You may set up this appointment before you leave today or you may call back ([phone]) and schedule an appointment.  Please make sure when you call that you mention that you are scheduling your Annual Wellness Visit with the clinical pharmacist so that the appointment may be made for the proper length of time.      Continue current medications. Continue good therapeutic lifestyle changes which include good diet and exercise. Fall precautions discussed with patient. If an FOBT was given today- please return it to our front desk. If you are over 64 years old - you may need Prevnar 56 or the adult Pneumonia vaccine.  Make sure that you use a deodorant that is scent-free The same applies for soap, fabric softener, and detergent Use cream as directed   Arrie Senate MD

## 2013-09-20 NOTE — Patient Instructions (Addendum)
Medicare Annual Wellness Visit  [facility] and the medical providers at [facility] strive to bring you the best medical care.  In doing so we not only want to address your current medical conditions and concerns but also to detect new conditions early and prevent illness, disease and health-related problems.    Medicare offers a yearly Wellness Visit which allows our clinical staff to assess your need for preventative services including immunizations, lifestyle education, counseling to decrease risk of preventable diseases and screening for fall risk and other medical concerns.    This visit is provided free of charge (no copay) for all Medicare recipients. The clinical pharmacists at [facility] have begun to conduct these Wellness Visits which will also include a thorough review of all your medications.    As you primary medical provider recommend that you make an appointment for your Annual Wellness Visit if you have not done so already this year.  You may set up this appointment before you leave today or you may call back ([phone]) and schedule an appointment.  Please make sure when you call that you mention that you are scheduling your Annual Wellness Visit with the clinical pharmacist so that the appointment may be made for the proper length of time.      Continue current medications. Continue good therapeutic lifestyle changes which include good diet and exercise. Fall precautions discussed with patient. If an FOBT was given today- please return it to our front desk. If you are over 63 years old - you may need Prevnar 75 or the adult Pneumonia vaccine.  Make sure that you use a deodorant that is scent-free The same applies for soap, fabric softener, and detergent Use cream as directed

## 2013-09-21 LAB — NMR, LIPOPROFILE
CHOLESTEROL: 129 mg/dL (ref 100–199)
HDL CHOLESTEROL BY NMR: 43 mg/dL (ref >39)
HDL PARTICLE NUMBER: 34 umol/L (ref >=30.5)
LDL Particle Number: 666 nmol/L (ref <1000)
LDL Size: 20.2 nm (ref >20.5)
LDLC SERPL CALC-MCNC: 63 mg/dL (ref 0–99)
LP-IR Score: 57 — ABNORMAL HIGH (ref <=45)
Small LDL Particle Number: 368 nmol/L (ref <=527)
Triglycerides by NMR: 114 mg/dL (ref 0–149)

## 2013-09-21 LAB — HEPATIC FUNCTION PANEL
ALT: 40 IU/L (ref 0–44)
AST: 31 IU/L (ref 0–40)
Albumin: 4.6 g/dL (ref 3.6–4.8)
Alkaline Phosphatase: 55 IU/L (ref 39–117)
Bilirubin, Direct: 0.19 mg/dL (ref 0.00–0.40)
Total Bilirubin: 0.9 mg/dL (ref 0.0–1.2)
Total Protein: 6.8 g/dL (ref 6.0–8.5)

## 2013-09-21 LAB — VITAMIN D 25 HYDROXY (VIT D DEFICIENCY, FRACTURES): Vit D, 25-Hydroxy: 29.2 ng/mL — ABNORMAL LOW (ref 30.0–100.0)

## 2013-09-21 LAB — BMP8+EGFR
BUN / CREAT RATIO: 23 — AB (ref 10–22)
BUN: 21 mg/dL (ref 8–27)
CO2: 24 mmol/L (ref 18–29)
CREATININE: 0.92 mg/dL (ref 0.76–1.27)
Calcium: 9.6 mg/dL (ref 8.6–10.2)
Chloride: 99 mmol/L (ref 97–108)
GFR calc Af Amer: 99 mL/min/{1.73_m2} (ref >59)
GFR, EST NON AFRICAN AMERICAN: 86 mL/min/{1.73_m2} (ref >59)
Glucose: 98 mg/dL (ref 65–99)
POTASSIUM: 4.2 mmol/L (ref 3.5–5.2)
Sodium: 140 mmol/L (ref 134–144)

## 2013-09-23 ENCOUNTER — Encounter: Payer: Self-pay | Admitting: Family Medicine

## 2013-12-21 ENCOUNTER — Encounter (INDEPENDENT_AMBULATORY_CARE_PROVIDER_SITE_OTHER): Payer: Self-pay

## 2013-12-21 ENCOUNTER — Ambulatory Visit (INDEPENDENT_AMBULATORY_CARE_PROVIDER_SITE_OTHER): Payer: Medicare Other

## 2013-12-21 DIAGNOSIS — Z23 Encounter for immunization: Secondary | ICD-10-CM

## 2014-03-28 ENCOUNTER — Ambulatory Visit: Payer: Medicare Other | Admitting: Family Medicine

## 2014-04-04 ENCOUNTER — Ambulatory Visit: Payer: Self-pay | Admitting: Family Medicine

## 2014-04-18 ENCOUNTER — Ambulatory Visit: Payer: Self-pay | Admitting: Family Medicine

## 2014-05-18 ENCOUNTER — Encounter: Payer: Self-pay | Admitting: Family Medicine

## 2014-05-18 ENCOUNTER — Ambulatory Visit (INDEPENDENT_AMBULATORY_CARE_PROVIDER_SITE_OTHER): Payer: Medicare Other | Admitting: Family Medicine

## 2014-05-18 VITALS — BP 133/83 | HR 71 | Temp 97.4°F | Ht 73.0 in | Wt 208.0 lb

## 2014-05-18 DIAGNOSIS — N400 Enlarged prostate without lower urinary tract symptoms: Secondary | ICD-10-CM

## 2014-05-18 DIAGNOSIS — E559 Vitamin D deficiency, unspecified: Secondary | ICD-10-CM

## 2014-05-18 DIAGNOSIS — C229 Malignant neoplasm of liver, not specified as primary or secondary: Secondary | ICD-10-CM | POA: Diagnosis not present

## 2014-05-18 DIAGNOSIS — E785 Hyperlipidemia, unspecified: Secondary | ICD-10-CM | POA: Diagnosis not present

## 2014-05-18 DIAGNOSIS — I10 Essential (primary) hypertension: Secondary | ICD-10-CM

## 2014-05-18 LAB — POCT URINALYSIS DIPSTICK
Bilirubin, UA: NEGATIVE
Glucose, UA: NEGATIVE
Ketones, UA: NEGATIVE
LEUKOCYTES UA: NEGATIVE
NITRITE UA: NEGATIVE
PH UA: 7
Protein, UA: NEGATIVE
RBC UA: NEGATIVE
Spec Grav, UA: 1.015
UROBILINOGEN UA: NEGATIVE

## 2014-05-18 LAB — POCT CBC
GRANULOCYTE PERCENT: 84.2 % — AB (ref 37–80)
HCT, POC: 45.2 % (ref 43.5–53.7)
Hemoglobin: 13.6 g/dL — AB (ref 14.1–18.1)
LYMPH, POC: 0.9 (ref 0.6–3.4)
MCH, POC: 29.4 pg (ref 27–31.2)
MCHC: 30.2 g/dL — AB (ref 31.8–35.4)
MCV: 97.4 fL — AB (ref 80–97)
MPV: 6.6 fL (ref 0–99.8)
PLATELET COUNT, POC: 176 10*3/uL (ref 142–424)
POC Granulocyte: 5.6 (ref 2–6.9)
POC LYMPH %: 13.1 % (ref 10–50)
RBC: 4.64 M/uL — AB (ref 4.69–6.13)
RDW, POC: 23.3 %
WBC: 6.6 10*3/uL (ref 4.6–10.2)

## 2014-05-18 LAB — POCT UA - MICROSCOPIC ONLY
BACTERIA, U MICROSCOPIC: NEGATIVE
Casts, Ur, LPF, POC: NEGATIVE
Crystals, Ur, HPF, POC: NEGATIVE
MUCUS UA: NEGATIVE
RBC, URINE, MICROSCOPIC: NEGATIVE
WBC, Ur, HPF, POC: NEGATIVE
Yeast, UA: NEGATIVE

## 2014-05-18 MED ORDER — ATORVASTATIN CALCIUM 40 MG PO TABS: 40.0000 mg | ORAL_TABLET | Freq: Every day | ORAL | Status: DC

## 2014-05-18 MED ORDER — BENAZEPRIL HCL 20 MG PO TABS: 20.0000 mg | ORAL_TABLET | Freq: Every day | ORAL | Status: DC

## 2014-05-18 MED ORDER — HYDROCHLOROTHIAZIDE 25 MG PO TABS: 25.0000 mg | ORAL_TABLET | Freq: Every day | ORAL | Status: DC

## 2014-05-18 MED ORDER — PANTOPRAZOLE SODIUM 40 MG PO TBEC: 40.0000 mg | DELAYED_RELEASE_TABLET | Freq: Every day | ORAL | Status: DC

## 2014-05-18 MED ORDER — AMLODIPINE BESYLATE 10 MG PO TABS: 10.0000 mg | ORAL_TABLET | Freq: Every day | ORAL | Status: DC

## 2014-05-18 NOTE — Progress Notes (Signed)
Subjective:    Patient ID: Grant Cummings, male    DOB: 1945/10/29, 69 y.o.   MRN: [id number]  HPI Pt here for follow up and management of chronic medical problems which includes hypertension, hyperlipidemia, and liver cancer. The patient's history regarding his liver cancer is that he has had 3 surgeries for removal of tumor which has been low grade. The last surgery was in January of this year. This time however he has had chemotherapy treatments and 28 radiation treatments. He has a follow-up visit with his oncologist in the coming week and future CT scans are pending. He is doing very well and feeling very well considering the recent chemotherapy and radiation treatment. He denies chest pain shortness of breath GI symptoms other than constipation and denies any blood in the stool. He is also having no problems with passing his water. He denies headache and dizziness.         Patient Active Problem List   Diagnosis Date Noted  . BPH (benign prostatic hyperplasia) 03/22/2013  . Vitamin D deficiency 03/22/2013  . Hearing deficit 03/22/2013  . Liver cancer   . Hyperlipidemia   . Hypertension    Outpatient Encounter Prescriptions as of 05/18/2014  Medication Sig  . amLODipine (NORVASC) 10 MG tablet Take 1 tablet (10 mg total) by mouth daily.  Marland Kitchen aspirin 81 MG tablet Take 81 mg by mouth daily.  Marland Kitchen atorvastatin (LIPITOR) 40 MG tablet Take 1 tablet (40 mg total) by mouth daily.  . benazepril (LOTENSIN) 20 MG tablet Take 1 tablet (20 mg total) by mouth daily.  . cholecalciferol (VITAMIN D) 1000 UNITS tablet Take 1,000 Units by mouth daily.  Marland Kitchen co-enzyme Q-10 30 MG capsule Take 30 mg by mouth daily.  . hydrochlorothiazide (HYDRODIURIL) 25 MG tablet Take 1 tablet (25 mg total) by mouth daily.  . Omega 3 1000 MG CAPS Take 1 capsule by mouth daily.  . pantoprazole (PROTONIX) 40 MG tablet Take 1 tablet (40 mg total) by mouth daily.  . [DISCONTINUED] Multiple Vitamin (MULTIVITAMIN WITH MINERALS)  TABS tablet Take 1 tablet by mouth daily.  . [DISCONTINUED] fluocinonide-emollient (LIDEX-E) 0.05 % cream Apply 1 application topically 2 (two) times daily. Use sparingly    Review of Systems  Constitutional: Negative.   HENT: Negative.   Eyes: Negative.   Respiratory: Negative.   Cardiovascular: Negative.   Gastrointestinal: Negative.   Endocrine: Negative.   Genitourinary: Negative.   Musculoskeletal: Negative.   Skin: Negative.   Allergic/Immunologic: Negative.   Neurological: Negative.   Hematological: Negative.   Psychiatric/Behavioral: Negative.        Objective:   Physical Exam  Constitutional: He is oriented to person, place, and time. He appears well-developed and well-nourished. No distress.  HENT:  Head: Normocephalic and atraumatic.  Right Ear: External ear normal.  Left Ear: External ear normal.  Nose: Nose normal.  Mouth/Throat: Oropharynx is clear and moist. No oropharyngeal exudate.  Eyes: Conjunctivae and EOM are normal. Pupils are equal, round, and reactive to light. Right eye exhibits no discharge. Left eye exhibits no discharge. No scleral icterus.  Neck: Normal range of motion. Neck supple. No thyromegaly present.  Cardiovascular: Normal rate, regular rhythm, normal heart sounds and intact distal pulses.   No murmur heard. At 72/m with a regular rate and rhythm  Pulmonary/Chest: Effort normal and breath sounds normal. No respiratory distress. He has no wheezes. He has no rales. He exhibits no tenderness.  No axillary adenopathy and lungs are clear anteriorly and  posteriorly  Abdominal: Soft. Bowel sounds are normal. He exhibits no mass. There is no tenderness. There is no rebound and no guarding.  There is a right upper quadrant scar secondary to the liver cancer surgeries. There is no significant tenderness and there is no fullness in this area.  Genitourinary: Rectum normal and penis normal.  The prostate was minimally enlarged without lumps or masses.  There are no rectal masses. There were no inguinal hernias palpated. The external genitalia were within normal limits.  Musculoskeletal: Normal range of motion. He exhibits no edema or tenderness.  Lymphadenopathy:    He has no cervical adenopathy.  Neurological: He is alert and oriented to person, place, and time. He has normal reflexes. No cranial nerve deficit.  Skin: Skin is warm and dry. Rash noted. No erythema. No pallor.  There is a skin rash of splotchy redness on both forearms secondary to the chemotherapy. This is not giving him any problems.  Psychiatric: He has a normal mood and affect. His behavior is normal. Judgment and thought content normal.  Nursing note and vitals reviewed.  BP 133/83 mmHg  Pulse 71  Temp(Src) 97.4 F (36.3 C) (Oral)  Ht $R'6\' 1"'JS$  (1.854 m)  Wt 208 lb (94.348 kg)  BMI 27.45 kg/m2  EKG: Within normal limits       Assessment & Plan:  1. Hyperlipidemia -The patient should continue with his current lipid treatment and as aggressive therapeutic lifestyle changes as possible and any changes in treatment will be dependent upon lab work which is being done today. - POCT CBC - NMR, lipoprofile - atorvastatin (LIPITOR) 40 MG tablet; Take 1 tablet (40 mg total) by mouth daily.  Dispense: 30 tablet; Refill: 11  2. Essential hypertension -The blood pressure is good today and he should continue with his current treatment - POCT CBC - BMP8+EGFR - Hepatic function panel - benazepril (LOTENSIN) 20 MG tablet; Take 1 tablet (20 mg total) by mouth daily.  Dispense: 30 tablet; Refill: 11 - amLODipine (NORVASC) 10 MG tablet; Take 1 tablet (10 mg total) by mouth daily.  Dispense: 30 tablet; Refill: 11 - hydrochlorothiazide (HYDRODIURIL) 25 MG tablet; Take 1 tablet (25 mg total) by mouth daily.  Dispense: 30 tablet; Refill: 11  3. Vitamin D deficiency -The patient should continue with his vitamin D 3, 1000 units daily and any change in his treatment will be secondary  to the results of lab work. - POCT CBC - Vit D  25 hydroxy (rtn osteoporosis monitoring)  4. BPH (benign prostatic hyperplasia) -This is causing no problems for the patient and we will be checking a PSA today. - POCT CBC - PSA, total and free - POCT UA - Microscopic Only - POCT urinalysis dipstick  5. Malignant neoplasm of liver, unspecified liver malignancy -Continue follow-up with oncologist  6. Constipation -Continue with drinking plenty of fluids and taking stool softeners  Meds ordered this encounter  Medications  . atorvastatin (LIPITOR) 40 MG tablet    Sig: Take 1 tablet (40 mg total) by mouth daily.    Dispense:  30 tablet    Refill:  11  . benazepril (LOTENSIN) 20 MG tablet    Sig: Take 1 tablet (20 mg total) by mouth daily.    Dispense:  30 tablet    Refill:  11  . amLODipine (NORVASC) 10 MG tablet    Sig: Take 1 tablet (10 mg total) by mouth daily.    Dispense:  30 tablet    Refill:  11  . hydrochlorothiazide (HYDRODIURIL) 25 MG tablet    Sig: Take 1 tablet (25 mg total) by mouth daily.    Dispense:  30 tablet    Refill:  11  . pantoprazole (PROTONIX) 40 MG tablet    Sig: Take 1 tablet (40 mg total) by mouth daily.    Dispense:  30 tablet    Refill:  11   Patient Instructions                       Medicare Annual Wellness Visit   and the medical providers at [facility] strive to bring you the best medical care.  In doing so we not only want to address your current medical conditions and concerns but also to detect new conditions early and prevent illness, disease and health-related problems.    Medicare offers a yearly Wellness Visit which allows our clinical staff to assess your need for preventative services including immunizations, lifestyle education, counseling to decrease risk of preventable diseases and screening for fall risk and other medical concerns.    This visit is provided free of charge (no copay) for all  Medicare recipients. The clinical pharmacists at [facility] have begun to conduct these Wellness Visits which will also include a thorough review of all your medications.    As you primary medical provider recommend that you make an appointment for your Annual Wellness Visit if you have not done so already this year.  You may set up this appointment before you leave today or you may call back ([phone]) and schedule an appointment.  Please make sure when you call that you mention that you are scheduling your Annual Wellness Visit with the clinical pharmacist so that the appointment may be made for the proper length of time.     Continue current medications. Continue good therapeutic lifestyle changes which include good diet and exercise. Fall precautions discussed with patient. If an FOBT was given today- please return it to our front desk. If you are over 37 years old - you may need Prevnar 14 or the adult Pneumonia vaccine.  Flu Shots are still available at our office. If you still haven't had one please call to set up a nurse visit to get one.   After your visit with [place] today you will receive a survey in the mail or online from [employer] regarding your care with [place]. Please take a moment to fill this out. Your feedback is very important to [place] as you can help [place] better understand your patient needs as well as improve your experience and satisfaction. WE CARE ABOUT YOU!!!   The patient should continue his regular follow-ups with his oncologist For his constipation, he should continue to drink plenty of fluids and take stool softeners as needed. He should stay active physically as much as he feels able to do   Arrie Senate MD

## 2014-05-18 NOTE — Patient Instructions (Addendum)
Medicare Annual Wellness Visit  [facility] and the medical providers at [facility] strive to bring you the best medical care.  In doing so we not only want to address your current medical conditions and concerns but also to detect new conditions early and prevent illness, disease and health-related problems.    Medicare offers a yearly Wellness Visit which allows our clinical staff to assess your need for preventative services including immunizations, lifestyle education, counseling to decrease risk of preventable diseases and screening for fall risk and other medical concerns.    This visit is provided free of charge (no copay) for all Medicare recipients. The clinical pharmacists at [facility] have begun to conduct these Wellness Visits which will also include a thorough review of all your medications.    As you primary medical provider recommend that you make an appointment for your Annual Wellness Visit if you have not done so already this year.  You may set up this appointment before you leave today or you may call back ([phone]) and schedule an appointment.  Please make sure when you call that you mention that you are scheduling your Annual Wellness Visit with the clinical pharmacist so that the appointment may be made for the proper length of time.     Continue current medications. Continue good therapeutic lifestyle changes which include good diet and exercise. Fall precautions discussed with patient. If an FOBT was given today- please return it to our front desk. If you are over 83 years old - you may need Prevnar 54 or the adult Pneumonia vaccine.  Flu Shots are still available at our office. If you still haven't had one please call to set up a nurse visit to get one.   After your visit with [place] today you will receive a survey in the mail or online from [employer] regarding your care with [place]. Please take a moment to  fill this out. Your feedback is very important to [place] as you can help [place] better understand your patient needs as well as improve your experience and satisfaction. WE CARE ABOUT YOU!!!   The patient should continue his regular follow-ups with his oncologist For his constipation, he should continue to drink plenty of fluids and take stool softeners as needed. He should stay active physically as much as he feels able to do

## 2014-05-19 LAB — VITAMIN D 25 HYDROXY (VIT D DEFICIENCY, FRACTURES): Vit D, 25-Hydroxy: 22.9 ng/mL — ABNORMAL LOW (ref 30.0–100.0)

## 2014-05-19 LAB — HEPATIC FUNCTION PANEL
ALBUMIN: 4.3 g/dL (ref 3.6–4.8)
ALK PHOS: 66 IU/L (ref 39–117)
ALT: 25 IU/L (ref 0–44)
AST: 29 IU/L (ref 0–40)
Bilirubin Total: 1 mg/dL (ref 0.0–1.2)
Bilirubin, Direct: 0.25 mg/dL (ref 0.00–0.40)
TOTAL PROTEIN: 7.1 g/dL (ref 6.0–8.5)

## 2014-05-19 LAB — NMR, LIPOPROFILE
Cholesterol: 143 mg/dL (ref 100–199)
HDL Cholesterol by NMR: 57 mg/dL (ref >39)
HDL Particle Number: 30.2 umol/L — ABNORMAL LOW (ref >=30.5)
LDL PARTICLE NUMBER: 796 nmol/L (ref <1000)
LDL Size: 21.1 nm (ref >20.5)
LDL-C: 64 mg/dL (ref 0–99)
SMALL LDL PARTICLE NUMBER: 285 nmol/L (ref <=527)
TRIGLYCERIDES BY NMR: 110 mg/dL (ref 0–149)

## 2014-05-19 LAB — BMP8+EGFR
BUN/Creatinine Ratio: 15 (ref 10–22)
BUN: 14 mg/dL (ref 8–27)
CO2: 26 mmol/L (ref 18–29)
Calcium: 9.4 mg/dL (ref 8.6–10.2)
Chloride: 98 mmol/L (ref 97–108)
Creatinine, Ser: 0.96 mg/dL (ref 0.76–1.27)
GFR calc non Af Amer: 81 mL/min/{1.73_m2} (ref >59)
GFR, EST AFRICAN AMERICAN: 94 mL/min/{1.73_m2} (ref >59)
Glucose: 94 mg/dL (ref 65–99)
POTASSIUM: 3.8 mmol/L (ref 3.5–5.2)
Sodium: 140 mmol/L (ref 134–144)

## 2014-05-19 LAB — PSA, TOTAL AND FREE
PSA, Free Pct: 8.7 %
PSA, Free: 0.13 ng/mL
PSA: 1.5 ng/mL (ref 0.0–4.0)

## 2014-05-19 NOTE — Progress Notes (Signed)
lmtcb

## 2014-05-20 ENCOUNTER — Ambulatory Visit: Payer: Self-pay | Admitting: Family Medicine

## 2014-06-21 ENCOUNTER — Telehealth: Payer: Self-pay | Admitting: Family Medicine

## 2014-06-21 ENCOUNTER — Other Ambulatory Visit (INDEPENDENT_AMBULATORY_CARE_PROVIDER_SITE_OTHER): Payer: Medicare Other

## 2014-06-21 DIAGNOSIS — R799 Abnormal finding of blood chemistry, unspecified: Secondary | ICD-10-CM | POA: Diagnosis not present

## 2014-06-21 NOTE — Telephone Encounter (Signed)
Pt already arrived

## 2014-06-22 LAB — PSA, TOTAL AND FREE
PSA FREE PCT: 12.5 %
PSA FREE: 0.05 ng/mL
Prostate Specific Ag, Serum: 0.4 ng/mL (ref 0.0–4.0)

## 2014-06-22 NOTE — Progress Notes (Signed)
lmtcb

## 2014-06-30 ENCOUNTER — Telehealth: Payer: Self-pay | Admitting: *Deleted

## 2014-06-30 NOTE — Telephone Encounter (Signed)
-----   Message from Chipper Herb, MD sent at 06/22/2014  7:29 AM EDT ----- The previous PSA was 1.5 and this was about 1 month ago. The one before that in the previous year was 0.2. The reading today is 0.4 and this is more consistent with what it should be. No further testing is necessary. April please discuss this result note with me as [facility] is not reporting previous PSAs and were having to look them up----this is been happening more and more.

## 2014-06-30 NOTE — Telephone Encounter (Signed)
Pt notified of results Verbalizes understanding 

## 2014-08-01 ENCOUNTER — Ambulatory Visit: Payer: Medicare Other | Admitting: Family Medicine

## 2014-10-12 ENCOUNTER — Telehealth: Payer: Self-pay | Admitting: Family Medicine

## 2014-10-12 NOTE — Telephone Encounter (Signed)
appt made

## 2014-10-14 ENCOUNTER — Encounter: Payer: Self-pay | Admitting: Family Medicine

## 2014-10-14 ENCOUNTER — Ambulatory Visit (INDEPENDENT_AMBULATORY_CARE_PROVIDER_SITE_OTHER): Payer: Medicare Other | Admitting: Family Medicine

## 2014-10-14 VITALS — BP 128/82 | HR 88 | Temp 97.6°F | Ht 73.0 in | Wt 216.0 lb

## 2014-10-14 DIAGNOSIS — C220 Liver cell carcinoma: Secondary | ICD-10-CM

## 2014-10-14 DIAGNOSIS — I809 Phlebitis and thrombophlebitis of unspecified site: Secondary | ICD-10-CM

## 2014-10-14 DIAGNOSIS — R609 Edema, unspecified: Secondary | ICD-10-CM | POA: Diagnosis not present

## 2014-10-14 DIAGNOSIS — K2961 Other gastritis with bleeding: Secondary | ICD-10-CM

## 2014-10-14 NOTE — Patient Instructions (Addendum)
Use warm wet compresses 20 minutes 4 times daily with leg elevated higher than the level of the heart Use the Ace bandage as directed and wrap the leg between use of warm wet compresses. Elevate the leg as directed when you using warm wet compresses and after 20 minutes reapply the Ace bandage as directed For now, we will refrain from any kind of aspirin treatment due to the recent major GI bleed that you have had.

## 2014-10-14 NOTE — Progress Notes (Signed)
Subjective:    Patient ID: Grant Cummings, male    DOB: 10-14-1945, 69 y.o.   MRN: [id number]  HPI Patient here today for right lower leg swelling. He is accompanied today by his wife.  This patient has a diagnosis of liver cancer that has come and gone over the past several years. He is followed regularly in [place] at [facility]. He recently had a GI bleed and had to receive several units of blood and platelets. They felt like the GI bleed was from both the chemotherapy and the aspirin that the patient had been taking. He was evaluated for superficial thrombophlebitis in the right leg and ultrasound was done to do this. This is about 1 month ago. He comes in today because of continued swelling and a knot in his right leg.       Patient Active Problem List   Diagnosis Date Noted  . BPH (benign prostatic hyperplasia) 03/22/2013  . Vitamin D deficiency 03/22/2013  . Hearing deficit 03/22/2013  . Liver cancer   . Hyperlipidemia   . Hypertension    Outpatient Encounter Prescriptions as of 10/14/2014  Medication Sig  . amLODipine (NORVASC) 10 MG tablet Take 1 tablet (10 mg total) by mouth daily.  Marland Kitchen atorvastatin (LIPITOR) 40 MG tablet Take 1 tablet (40 mg total) by mouth daily.  . cholecalciferol (VITAMIN D) 1000 UNITS tablet Take 1,000 Units by mouth daily.  . hydrochlorothiazide (HYDRODIURIL) 25 MG tablet Take 1 tablet (25 mg total) by mouth daily.  . pantoprazole (PROTONIX) 40 MG tablet Take 1 tablet (40 mg total) by mouth daily.  . [DISCONTINUED] aspirin 81 MG tablet Take 81 mg by mouth daily.  . [DISCONTINUED] benazepril (LOTENSIN) 20 MG tablet Take 1 tablet (20 mg total) by mouth daily.  . [DISCONTINUED] co-enzyme Q-10 30 MG capsule Take 30 mg by mouth daily.  . [DISCONTINUED] Omega 3 1000 MG CAPS Take 1 capsule by mouth daily.   No facility-administered encounter medications on file as of 10/14/2014.     Review of Systems  Constitutional: Negative.     HENT: Negative.   Eyes: Negative.   Respiratory: Negative.   Cardiovascular: Positive for leg swelling.  Gastrointestinal: Negative.   Endocrine: Negative.   Genitourinary: Negative.   Musculoskeletal: Negative.   Skin: Negative.        Right lower leg swelling and a knot  Allergic/Immunologic: Negative.   Neurological: Negative.   Hematological: Negative.   Psychiatric/Behavioral: Negative.        Objective:   Physical Exam  Constitutional: He is oriented to person, place, and time. He appears well-developed and well-nourished. No distress.  HENT:  Head: Normocephalic.  Eyes: Conjunctivae are normal. Pupils are equal, round, and reactive to light. Right eye exhibits no discharge. Left eye exhibits no discharge. No scleral icterus.  Musculoskeletal: Normal range of motion. He exhibits edema. He exhibits no tenderness.  Neurological: He is alert and oriented to person, place, and time.  Skin: Skin is warm and dry. No rash noted. No erythema. No pallor.  Psychiatric: He has a normal mood and affect. His behavior is normal. Judgment and thought content normal.  Nursing note and vitals reviewed.   BP 128/82 mmHg  Pulse 88  Temp(Src) 97.6 F (36.4 C) (Oral)  Ht 6\' 1"  (1.854 m)  Wt 216 lb (97.977 kg)  BMI 28.50 kg/m2       Assessment & Plan:  1. Hepatocellular carcinoma -Continue to follow-up with oncology  2. Superficial  phlebitis -Use warm wet compresses 20 minutes 4 times daily with leg elevated as directed -Apply Ace bandage upon first arising after doing the first treatment with warm wet compresses. -Continue to reapply the Ace bandage after warm wet compresses  3. Edema -Continue above treatments as planned  4. Gastrointestinal hemorrhage associated with other gastritis -Continue to avoid aspirin for the time being. Patient Instructions  Use warm wet compresses 20 minutes 4 times daily with leg elevated higher than the level of the heart Use the Ace bandage  as directed and wrap the leg between use of warm wet compresses. Elevate the leg as directed when you using warm wet compresses and after 20 minutes reapply the Ace bandage as directed For now, we will refrain from any kind of aspirin treatment due to the recent major GI bleed that you have had.   Arrie Senate MD

## 2014-11-22 ENCOUNTER — Ambulatory Visit (INDEPENDENT_AMBULATORY_CARE_PROVIDER_SITE_OTHER): Payer: Medicare Other | Admitting: Family Medicine

## 2014-11-22 ENCOUNTER — Ambulatory Visit (INDEPENDENT_AMBULATORY_CARE_PROVIDER_SITE_OTHER): Payer: Medicare Other

## 2014-11-22 ENCOUNTER — Encounter: Payer: Self-pay | Admitting: Family Medicine

## 2014-11-22 VITALS — BP 128/85 | HR 79 | Temp 97.4°F | Ht 73.0 in | Wt 218.0 lb

## 2014-11-22 DIAGNOSIS — I10 Essential (primary) hypertension: Secondary | ICD-10-CM | POA: Diagnosis not present

## 2014-11-22 DIAGNOSIS — E559 Vitamin D deficiency, unspecified: Secondary | ICD-10-CM

## 2014-11-22 DIAGNOSIS — R609 Edema, unspecified: Secondary | ICD-10-CM | POA: Diagnosis not present

## 2014-11-22 DIAGNOSIS — C220 Liver cell carcinoma: Secondary | ICD-10-CM

## 2014-11-22 DIAGNOSIS — N400 Enlarged prostate without lower urinary tract symptoms: Secondary | ICD-10-CM | POA: Diagnosis not present

## 2014-11-22 DIAGNOSIS — M25562 Pain in left knee: Secondary | ICD-10-CM

## 2014-11-22 DIAGNOSIS — E785 Hyperlipidemia, unspecified: Secondary | ICD-10-CM | POA: Diagnosis not present

## 2014-11-22 DIAGNOSIS — Z23 Encounter for immunization: Secondary | ICD-10-CM

## 2014-11-22 NOTE — Addendum Note (Signed)
Addended by: Zannie Cove on: 11/22/2014 10:47 AM   Modules accepted: Orders

## 2014-11-22 NOTE — Progress Notes (Signed)
Subjective:    Patient ID: Grant Cummings, male    DOB: 1945/10/13, 69 y.o.   MRN: [id number]  HPI Pt here for follow up and management of chronic medical problems which includes hypertension and hyperlipidemia. He is taking medications regularly. He is accompanied today by his wife. The patient mentions that a couple weeks ago he was chasing callus in the road and hurt his left knee. He does not recall falling or twisting it. Since that time especially at nighttime he has pain in the left medial knee. He is had minimal swelling. Tylenol does help. The patient continues to be followed by his specialist for his hepatocellular carcinoma. He is doing well with this at the present time. He is active and feeling well. He is in good spirits today. He denies chest pain shortness of breath trouble swallowing and heartburn indigestion nausea vomiting diarrhea or blood in the stool. He does take Protonix regularly and this controls his heartburn. He has to get up at nighttime at least once to pass his water.       Patient Active Problem List   Diagnosis Date Noted  . BPH (benign prostatic hyperplasia) 03/22/2013  . Vitamin D deficiency 03/22/2013  . Hearing deficit 03/22/2013  . Liver cancer (HCC)   . Hyperlipidemia   . Hypertension    Outpatient Encounter Prescriptions as of 11/22/2014  Medication Sig  . amLODipine (NORVASC) 10 MG tablet Take 1 tablet (10 mg total) by mouth daily.  Marland Kitchen atorvastatin (LIPITOR) 40 MG tablet Take 1 tablet (40 mg total) by mouth daily.  . cholecalciferol (VITAMIN D) 1000 UNITS tablet Take 1,000 Units by mouth daily.  . hydrochlorothiazide (HYDRODIURIL) 25 MG tablet Take 1 tablet (25 mg total) by mouth daily.  . pantoprazole (PROTONIX) 40 MG tablet Take 1 tablet (40 mg total) by mouth daily.   No facility-administered encounter medications on file as of 11/22/2014.       Review of Systems  Constitutional: Negative.   HENT: Negative.   Eyes: Negative.     Respiratory: Negative.   Cardiovascular: Negative.        Edema - bilateral ankles  Gastrointestinal: Negative.   Endocrine: Negative.   Genitourinary: Positive for discharge.  Musculoskeletal: Positive for arthralgias (left knee pain).  Skin: Negative.   Allergic/Immunologic: Negative.   Neurological: Negative.   Hematological: Negative.   Psychiatric/Behavioral: Negative.        Objective:   Physical Exam  Constitutional: He is oriented to person, place, and time. He appears well-developed and well-nourished.  HENT:  Head: Normocephalic and atraumatic.  Right Ear: External ear normal.  Left Ear: External ear normal.  Nose: Nose normal.  Mouth/Throat: Oropharynx is clear and moist. No oropharyngeal exudate.  The patient has had multiple precancerous cryotherapy lesions recently on his face frozen by the dermatologist.  Eyes: Conjunctivae and EOM are normal. Pupils are equal, round, and reactive to light. Right eye exhibits no discharge. Left eye exhibits no discharge. No scleral icterus.  Neck: Normal range of motion. Neck supple. No thyromegaly present.  Cardiovascular: Normal rate, regular rhythm, normal heart sounds and intact distal pulses.   No murmur heard. Heart is regular at 60/m  Pulmonary/Chest: Effort normal and breath sounds normal. No respiratory distress. He has no wheezes. He has no rales. He exhibits no tenderness.  Clear anteriorly and posteriorly  Abdominal: Soft. Bowel sounds are normal. He exhibits no mass. There is no tenderness. There is no rebound and no guarding.  There is  slight epigastric abdominal tenderness near this incisions from his previous surgery otherwise no masses or organ enlargement palpable or inguinal adenopathy. No bruits.  Musculoskeletal: Normal range of motion. He exhibits no edema or tenderness.  There is slight tenderness at the medial joint line of the left knee especially with flexion extension and internal and external rotation of  the knee  Lymphadenopathy:    He has no cervical adenopathy.  Neurological: He is alert and oriented to person, place, and time. He has normal reflexes. No cranial nerve deficit.  Skin: Skin is warm and dry. No rash noted.  Psychiatric: He has a normal mood and affect. His behavior is normal. Judgment and thought content normal.  Nursing note and vitals reviewed.  BP 128/85 mmHg  Pulse 79  Temp(Src) 97.4 F (36.3 C) (Oral)  Ht $R'6\' 1"'Ka$  (1.854 m)  Wt 218 lb (98.884 kg)  BMI 28.77 kg/m2  [facility] reading (PRIMARY) by  Dr. Louretta Parma knee--no acute findings                                   Assessment & Plan:  1. Hyperlipidemia -Continue aggressive therapeutic lifestyle changes and with current treatment pending results of lab work - CBC with Differential/Platelet - NMR, lipoprofile  2. Essential hypertension -The blood pressure is good today and the patient should continue with current treatment. He should check his weights daily and keep a record of this and as long as the weights are stable every morning when he arises no further treatment for edema is necessary. - BMP8+EGFR - CBC with Differential/Platelet - Hepatic function panel  3. Vitamin D deficiency -Continue current treatment pending results of lab work - CBC with Differential/Platelet - Vit D  25 hydroxy (rtn osteoporosis monitoring)  4. BPH (benign prostatic hyperplasia) -The patient does have nocturia 1 - CBC with Differential/Platelet  5. Hepatocellular carcinoma [facility]) -He will continue with follow-up by his specialist at [facility]  6. Edema, unspecified type -He will watch his sodium intake try to lose some weight and fluid support hose on every morning  7. Left knee pain -X-ray today and appointment with orthopedic surgeon for follow-up   Patient Instructions                       Medicare Annual Wellness Visit  [facility] and the medical providers at [facility] strive to bring you the best medical care.  In doing so we not only want to address your current medical conditions and concerns but also to detect new conditions early and prevent illness, disease and health-related problems.    Medicare offers a yearly Wellness Visit which allows our clinical staff to assess your need for preventative services including immunizations, lifestyle education, counseling to decrease risk of preventable diseases and screening for fall risk and other medical concerns.    This visit is provided free of charge (no copay) for all Medicare recipients. The clinical pharmacists at [facility] have begun to conduct these Wellness Visits which will also include a thorough review of all your medications.    As you primary medical provider recommend that you make an appointment for your Annual Wellness Visit if you have not done so already this year.  You may set up this appointment before you leave today or you may call back ([phone]) and schedule an appointment.  Please  make sure when you call that you mention that you are scheduling your Annual Wellness Visit with the clinical pharmacist so that the appointment may be made for the proper length of time.     Continue current medications. Continue good therapeutic lifestyle changes which include good diet and exercise. Fall precautions discussed with patient. If an FOBT was given today- please return it to our front desk. If you are over 51 years old - you may need Prevnar 34 or the adult Pneumonia vaccine.  **Flu shots will be available soon--- please call and schedule a FLU-CLINIC appointment**  After your visit with [place] today you will receive a survey in the mail or online from [employer] regarding your care with [place]. Please take a moment to fill this out. Your feedback is very important to [place] as you can help [place] better understand your patient needs as well as improve your experience and  satisfaction. WE CARE ABOUT YOU!!!    Continue to follow-up with oncology at [facility] Try to do better with watching her carbohydrate intake and sugar intake and continue to be aware of the sodium intake and reduce this as much as possible The Prevnar vaccine you received today may make your arm sore Take Tylenol as needed for pain and use warm wet compresses to the left knee with range of motion exercises We will call you with the results of the x-rays as soon as that becomes available We will arrange for you to visit with the orthopedic surgeon that comes to this office in a couple days for further evaluation of the left knee pain This winter drink plenty of fluids and stay well hydrated Please  record the weights daily the first thing with getting up in the morning Please put support hose on the first thing with arising in the morning   Arrie Senate MD

## 2014-11-22 NOTE — Patient Instructions (Addendum)
Medicare Annual Wellness Visit  [facility] and the medical providers at [facility] strive to bring you the best medical care.  In doing so we not only want to address your current medical conditions and concerns but also to detect new conditions early and prevent illness, disease and health-related problems.    Medicare offers a yearly Wellness Visit which allows our clinical staff to assess your need for preventative services including immunizations, lifestyle education, counseling to decrease risk of preventable diseases and screening for fall risk and other medical concerns.    This visit is provided free of charge (no copay) for all Medicare recipients. The clinical pharmacists at [facility] have begun to conduct these Wellness Visits which will also include a thorough review of all your medications.    As you primary medical provider recommend that you make an appointment for your Annual Wellness Visit if you have not done so already this year.  You may set up this appointment before you leave today or you may call back ([phone]) and schedule an appointment.  Please make sure when you call that you mention that you are scheduling your Annual Wellness Visit with the clinical pharmacist so that the appointment may be made for the proper length of time.     Continue current medications. Continue good therapeutic lifestyle changes which include good diet and exercise. Fall precautions discussed with patient. If an FOBT was given today- please return it to our front desk. If you are over 53 years old - you may need Prevnar 76 or the adult Pneumonia vaccine.  **Flu shots will be available soon--- please call and schedule a FLU-CLINIC appointment**  After your visit with [place] today you will receive a survey in the mail or online from [employer] regarding your care with [place]. Please take a moment to fill this out. Your feedback is  very important to [place] as you can help [place] better understand your patient needs as well as improve your experience and satisfaction. WE CARE ABOUT YOU!!!    Continue to follow-up with oncology at [facility] Try to do better with watching her carbohydrate intake and sugar intake and continue to be aware of the sodium intake and reduce this as much as possible The Prevnar vaccine you received today may make your arm sore Take Tylenol as needed for pain and use warm wet compresses to the left knee with range of motion exercises We will call you with the results of the x-rays as soon as that becomes available We will arrange for you to visit with the orthopedic surgeon that comes to this office in a couple days for further evaluation of the left knee pain This winter drink plenty of fluids and stay well hydrated Please  record the weights daily the first thing with getting up in the morning Please put support hose on the first thing with arising in the morning

## 2014-11-23 LAB — HEPATIC FUNCTION PANEL
ALBUMIN: 4.5 g/dL (ref 3.6–4.8)
ALK PHOS: 99 IU/L (ref 39–117)
ALT: 47 IU/L — ABNORMAL HIGH (ref 0–44)
AST: 42 IU/L — ABNORMAL HIGH (ref 0–40)
BILIRUBIN TOTAL: 0.6 mg/dL (ref 0.0–1.2)
Bilirubin, Direct: 0.21 mg/dL (ref 0.00–0.40)
Total Protein: 6.8 g/dL (ref 6.0–8.5)

## 2014-11-23 LAB — NMR, LIPOPROFILE
Cholesterol: 143 mg/dL (ref 100–199)
HDL CHOLESTEROL BY NMR: 55 mg/dL (ref >39)
HDL PARTICLE NUMBER: 32 umol/L (ref >=30.5)
LDL Particle Number: 722 nmol/L (ref <1000)
LDL Size: 20.9 nm (ref >20.5)
LDL-C: 67 mg/dL (ref 0–99)
LP-IR Score: 31 (ref <=45)
SMALL LDL PARTICLE NUMBER: 195 nmol/L (ref <=527)
Triglycerides by NMR: 103 mg/dL (ref 0–149)

## 2014-11-23 LAB — CBC WITH DIFFERENTIAL/PLATELET
BASOS ABS: 0 10*3/uL (ref 0.0–0.2)
BASOS: 0 %
EOS (ABSOLUTE): 0.1 10*3/uL (ref 0.0–0.4)
Eos: 1 %
Hematocrit: 41.6 % (ref 37.5–51.0)
Hemoglobin: 13.9 g/dL (ref 12.6–17.7)
IMMATURE GRANS (ABS): 0 10*3/uL (ref 0.0–0.1)
IMMATURE GRANULOCYTES: 0 %
LYMPHS: 11 %
Lymphocytes Absolute: 0.5 10*3/uL — ABNORMAL LOW (ref 0.7–3.1)
MCH: 32 pg (ref 26.6–33.0)
MCHC: 33.4 g/dL (ref 31.5–35.7)
MCV: 96 fL (ref 79–97)
Monocytes Absolute: 0.3 10*3/uL (ref 0.1–0.9)
Monocytes: 7 %
NEUTROS PCT: 81 %
Neutrophils Absolute: 3.7 10*3/uL (ref 1.4–7.0)
Platelets: 130 10*3/uL — ABNORMAL LOW (ref 150–379)
RBC: 4.35 x10E6/uL (ref 4.14–5.80)
RDW: 15.3 % (ref 12.3–15.4)
WBC: 4.6 10*3/uL (ref 3.4–10.8)

## 2014-11-23 LAB — BMP8+EGFR
BUN/Creatinine Ratio: 18 (ref 10–22)
BUN: 18 mg/dL (ref 8–27)
CALCIUM: 9.6 mg/dL (ref 8.6–10.2)
CO2: 26 mmol/L (ref 18–29)
Chloride: 100 mmol/L (ref 97–108)
Creatinine, Ser: 1 mg/dL (ref 0.76–1.27)
GFR calc Af Amer: 89 mL/min/{1.73_m2} (ref >59)
GFR, EST NON AFRICAN AMERICAN: 77 mL/min/{1.73_m2} (ref >59)
GLUCOSE: 109 mg/dL — AB (ref 65–99)
POTASSIUM: 3.7 mmol/L (ref 3.5–5.2)
Sodium: 141 mmol/L (ref 134–144)

## 2014-11-23 LAB — VITAMIN D 25 HYDROXY (VIT D DEFICIENCY, FRACTURES): Vit D, 25-Hydroxy: 32.4 ng/mL (ref 30.0–100.0)

## 2014-12-08 ENCOUNTER — Other Ambulatory Visit: Payer: Self-pay | Admitting: Orthopedic Surgery

## 2014-12-08 ENCOUNTER — Ambulatory Visit: Payer: Medicare Other

## 2014-12-08 ENCOUNTER — Other Ambulatory Visit: Payer: Medicare Other

## 2014-12-08 DIAGNOSIS — R52 Pain, unspecified: Secondary | ICD-10-CM

## 2015-02-12 HISTORY — PX: EYE SURGERY: SHX253

## 2015-04-17 ENCOUNTER — Ambulatory Visit (INDEPENDENT_AMBULATORY_CARE_PROVIDER_SITE_OTHER): Payer: Medicare Other | Admitting: Family

## 2015-04-17 ENCOUNTER — Encounter: Payer: Self-pay | Admitting: Family

## 2015-04-17 VITALS — BP 136/92 | HR 82 | Temp 97.7°F | Ht 73.0 in | Wt 215.0 lb

## 2015-04-17 DIAGNOSIS — J101 Influenza due to other identified influenza virus with other respiratory manifestations: Secondary | ICD-10-CM | POA: Diagnosis not present

## 2015-04-17 DIAGNOSIS — R6889 Other general symptoms and signs: Secondary | ICD-10-CM | POA: Diagnosis not present

## 2015-04-17 LAB — VERITOR FLU A/B WAIVED
INFLUENZA B: NEGATIVE
Influenza A: POSITIVE — AB

## 2015-04-17 MED ORDER — BENZONATATE 200 MG PO CAPS: 200.0000 mg | ORAL_CAPSULE | Freq: Three times a day (TID) | ORAL | Status: DC | PRN

## 2015-04-17 MED ORDER — HYDROCODONE-HOMATROPINE 5-1.5 MG/5ML PO SYRP: 5.0000 mL | ORAL_SOLUTION | Freq: Three times a day (TID) | ORAL | Status: DC | PRN

## 2015-04-17 MED ORDER — OSELTAMIVIR PHOSPHATE 75 MG PO CAPS: 75.0000 mg | ORAL_CAPSULE | Freq: Two times a day (BID) | ORAL | Status: DC

## 2015-04-17 NOTE — Patient Instructions (Signed)

## 2015-04-17 NOTE — Progress Notes (Signed)
   Subjective:    Patient ID: Grant Cummings, male    DOB: [birth year], 70 y.o.   MRN: [id number]  Cough This is a new problem. The current episode started in the past 7 days. The problem has been waxing and waning. The problem occurs every few minutes. The cough is productive of sputum. Associated symptoms include chills, a fever, headaches and myalgias. Pertinent negatives include no ear congestion, ear pain, rhinorrhea or sore throat. The symptoms are aggravated by lying down. He has tried OTC cough suppressant for the symptoms. The treatment provided mild relief. There is no history of asthma or COPD.      Review of Systems  Constitutional: Positive for fever and chills.  HENT: Negative for ear pain, rhinorrhea and sore throat.   Respiratory: Positive for cough.   Cardiovascular: Negative.   Gastrointestinal: Negative.   Endocrine: Negative.   Genitourinary: Negative.   Musculoskeletal: Positive for myalgias.  Neurological: Positive for headaches.  Hematological: Negative.   Psychiatric/Behavioral: Negative.   All other systems reviewed and are negative.      Objective:   Physical Exam  Constitutional: He is oriented to person, place, and time. He appears well-developed and well-nourished. No distress.  HENT:  Head: Normocephalic.  Right Ear: External ear normal.  Left Ear: External ear normal.  Mouth/Throat: Oropharynx is clear and moist.  Nasal passage erythemas with mild swelling    Eyes: Pupils are equal, round, and reactive to light. Right eye exhibits no discharge. Left eye exhibits no discharge.  Neck: Normal range of motion. Neck supple. No thyromegaly present.  Cardiovascular: Normal rate, regular rhythm, normal heart sounds and intact distal pulses.   No murmur heard. Pulmonary/Chest: Effort normal and breath sounds normal. No respiratory distress. He has no wheezes.  Abdominal: Soft. Bowel sounds are normal. He exhibits no distension. There is no tenderness.    Musculoskeletal: Normal range of motion. He exhibits no edema or tenderness.  Neurological: He is alert and oriented to person, place, and time. He has normal reflexes. No cranial nerve deficit.  Skin: Skin is warm and dry. No rash noted. No erythema.  Psychiatric: He has a normal mood and affect. His behavior is normal. Judgment and thought content normal.  Vitals reviewed.     BP 136/92 mmHg  Pulse 82  Temp(Src) 97.7 F (36.5 C) (Oral)  Ht 6\' 1"  (1.854 m)  Wt 215 lb (97.523 kg)  BMI 28.37 kg/m2     Assessment & Plan:  1. Flu-like symptoms - Veritor Flu A/B Waived - oseltamivir (TAMIFLU) 75 MG capsule; Take 1 capsule (75 mg total) by mouth 2 (two) times daily.  Dispense: 10 capsule; Refill: 0 - benzonatate (TESSALON) 200 MG capsule; Take 1 capsule (200 mg total) by mouth 3 (three) times daily as needed.  Dispense: 30 capsule; Refill: 1  2. Influenza A -Rest -Force fluids -Good hand hygiene discussed -Tylenol prn for pain or fever -RTO prn - oseltamivir (TAMIFLU) 75 MG capsule; Take 1 capsule (75 mg total) by mouth 2 (two) times daily.  Dispense: 10 capsule; Refill: 0 - benzonatate (TESSALON) 200 MG capsule; Take 1 capsule (200 mg total) by mouth 3 (three) times daily as needed.  Dispense: 30 capsule; Refill: 1 - HYDROcodone-homatropine (HYCODAN) 5-1.5 MG/5ML syrup; Take 5 mLs by mouth every 8 (eight) hours as needed for cough.  Dispense: 120 mL; Refill: 0  Evelina Dun, FNP

## 2015-05-08 ENCOUNTER — Ambulatory Visit (INDEPENDENT_AMBULATORY_CARE_PROVIDER_SITE_OTHER): Payer: Medicare Other | Admitting: Pharmacist

## 2015-05-08 ENCOUNTER — Encounter: Payer: Self-pay | Admitting: Pharmacist

## 2015-05-08 VITALS — BP 159/90 | HR 80 | Ht 72.25 in | Wt 222.0 lb

## 2015-05-08 DIAGNOSIS — Z0000 Encounter for general adult medical examination without abnormal findings: Secondary | ICD-10-CM

## 2015-05-08 DIAGNOSIS — K439 Ventral hernia without obstruction or gangrene: Secondary | ICD-10-CM

## 2015-05-08 DIAGNOSIS — Z6711 Type A blood, Rh negative: Secondary | ICD-10-CM

## 2015-05-08 NOTE — Patient Instructions (Signed)
Grant Cummings , Thank you for taking time to come for your Medicare Wellness Visit. I appreciate your ongoing commitment to your health goals. Please review the following plan we discussed and let me know if I can assist you in the future.   These are the goals we discussed:  Continue to stay active - goal is to exercise 150 minutes per week.  Increase non-starchy vegetables - carrots, green bean, squash, zucchini, tomatoes, onions, peppers, spinach and other green leafy vegetables, cabbage, lettuce, cucumbers, asparagus, okra (not fried), eggplant limit sugar and processed foods (cakes, cookies, ice cream, crackers and chips) Increase fresh fruit but limit serving sizes 1/2 cup or about the size of tennis or baseball limit red meat to no more than 1-2 times per week (serving size about the size of your palm) Choose whole grains / lean proteins - whole wheat bread, quinoa, whole grain rice (1/2 cup), fish, chicken, [place]  This is a list of the screening recommended for you and due dates:  Health Maintenance  Topic Date Due  . Shingles Vaccine  02/23/2006  . Stool Blood Test  03/10/2011  .  Hepatitis C: One time screening is recommended by Center for Disease Control  (CDC) for  adults born from [birth year] through 1964.   07/23/2015*  . Flu Shot  09/12/2015  . Tetanus Vaccine  03/22/2017  . Colon Cancer Screening  02/12/2019  . Pneumonia vaccines  Completed  *Topic was postponed. The date shown is not the original due date.   Health Maintenance, Male A healthy lifestyle and preventative care can promote health and wellness.  Maintain regular health, dental, and eye exams.  Eat a healthy diet. Foods like vegetables, fruits, whole grains, low-fat dairy products, and lean protein foods contain the nutrients you need and are low in calories. Decrease your intake of foods high in solid fats, added sugars, and salt. Get information about a proper diet from your health care provider, if  necessary.  Regular physical exercise is one of the most important things you can do for your health. Most adults should get at least 150 minutes of moderate-intensity exercise (any activity that increases your heart rate and causes you to sweat) each week. In addition, most adults need muscle-strengthening exercises on 2 or more days a week.   Maintain a healthy weight. The body mass index (BMI) is a screening tool to identify possible weight problems. It provides an estimate of body fat based on height and weight. Your health care provider can find your BMI and can help you achieve or maintain a healthy weight. For males 20 years and older:  A BMI below 18.5 is considered underweight.  A BMI of 18.5 to 24.9 is normal.  A BMI of 25 to 29.9 is considered overweight.  A BMI of 30 and above is considered obese.  Maintain normal blood lipids and cholesterol by exercising and minimizing your intake of saturated fat. Eat a balanced diet with plenty of fruits and vegetables. Blood tests for lipids and cholesterol should begin at age 36 and be repeated every 5 years. If your lipid or cholesterol levels are high, you are over age 21, or you are at high risk for heart disease, you may need your cholesterol levels checked more frequently.Ongoing high lipid and cholesterol levels should be treated with medicines if diet and exercise are not working.  If you smoke, find out from your health care provider how to quit. If you do not use tobacco, do not  start.  Lung cancer screening is recommended for adults aged 78-80 years who are at high risk for developing lung cancer because of a history of smoking. A yearly low-dose CT scan of the lungs is recommended for people who have at least a 30-pack-year history of smoking and are current smokers or have quit within the past 15 years. A pack year of smoking is smoking an average of 1 pack of cigarettes a day for 1 year (for example, a 30-pack-year history of  smoking could mean smoking 1 pack a day for 30 years or 2 packs a day for 15 years). Yearly screening should continue until the smoker has stopped smoking for at least 15 years. Yearly screening should be stopped for people who develop a health problem that would prevent them from having lung cancer treatment.  If you choose to drink alcohol, do not have more than 2 drinks per day. One drink is considered to be 12 oz (360 mL) of beer, 5 oz (150 mL) of wine, or 1.5 oz (45 mL) of liquor.  Avoid the use of street drugs. Do not share needles with anyone. Ask for help if you need support or instructions about stopping the use of drugs.  High blood pressure causes heart disease and increases the risk of stroke. High blood pressure is more likely to develop in:  People who have blood pressure in the end of the normal range (100-139/85-89 mm Hg).  People who are overweight or obese.  People who are African American.  If you are 34-90 years of age, have your blood pressure checked every 3-5 years. If you are 93 years of age or older, have your blood pressure checked every year. You should have your blood pressure measured twice--once when you are at a hospital or clinic, and once when you are not at a hospital or clinic. Record the average of the two measurements. To check your blood pressure when you are not at a hospital or clinic, you can use:  An automated blood pressure machine at a pharmacy.  A home blood pressure monitor.  If you are 39-32 years old, ask your health care provider if you should take aspirin to prevent heart disease.  Diabetes screening involves taking a blood sample to check your fasting blood sugar level. This should be done once every 3 years after age 48 if you are at a normal weight and without risk factors for diabetes. Testing should be considered at a younger age or be carried out more frequently if you are overweight and have at least 1 risk factor for  diabetes.  Colorectal cancer can be detected and often prevented. Most routine colorectal cancer screening begins at the age of 36 and continues through age 80. However, your health care provider may recommend screening at an earlier age if you have risk factors for colon cancer. On a yearly basis, your health care provider may provide home test kits to check for hidden blood in the stool. A small camera at the end of a tube may be used to directly examine the colon (sigmoidoscopy or colonoscopy) to detect the earliest forms of colorectal cancer. Talk to your health care provider about this at age 68 when routine screening begins. A direct exam of the colon should be repeated every 5-10 years through age 64, unless early forms of precancerous polyps or small growths are found.  People who are at an increased risk for hepatitis B should be screened for this virus. You  are considered at high risk for hepatitis B if:  You were born in a country where hepatitis B occurs often. Talk with your health care provider about which countries are considered high risk.  Your parents were born in a high-risk country and you have not received a shot to protect against hepatitis B (hepatitis B vaccine).  You have HIV or AIDS.  You use needles to inject street drugs.  You live with, or have sex with, someone who has hepatitis B.  You are a man who has sex with other men (MSM).  You get hemodialysis treatment.  You take certain medicines for conditions like cancer, organ transplantation, and autoimmune conditions.  Hepatitis C blood testing is recommended for all people born from [birth year] through 1965 and any individual with known risk factors for hepatitis C.  Healthy men should no longer receive prostate-specific antigen (PSA) blood tests as part of routine cancer screening. Talk to your health care provider about prostate cancer screening.  Testicular cancer screening is not recommended for adolescents or  adult males who have no symptoms. Screening includes self-exam, a health care provider exam, and other screening tests. Consult with your health care provider about any symptoms you have or any concerns you have about testicular cancer.  Practice safe sex. Use condoms and avoid high-risk sexual practices to reduce the spread of sexually transmitted infections (STIs).  You should be screened for STIs, including gonorrhea and chlamydia if:  You are sexually active and are younger than 24 years.  You are older than 24 years, and your health care provider tells you that you are at risk for this type of infection.  Your sexual activity has changed since you were last screened, and you are at an increased risk for chlamydia or gonorrhea. Ask your health care provider if you are at risk.  If you are at risk of being infected with HIV, it is recommended that you take a prescription medicine daily to prevent HIV infection. This is called pre-exposure prophylaxis (PrEP). You are considered at risk if:  You are a man who has sex with other men (MSM).  You are a heterosexual man who is sexually active with multiple partners.  You take drugs by injection.  You are sexually active with a partner who has HIV.  Talk with your health care provider about whether you are at high risk of being infected with HIV. If you choose to begin PrEP, you should first be tested for HIV. You should then be tested every 3 months for as long as you are taking PrEP.  Use sunscreen. Apply sunscreen liberally and repeatedly throughout the day. You should seek shade when your shadow is shorter than you. Protect yourself by wearing long sleeves, pants, a wide-brimmed hat, and sunglasses year round whenever you are outdoors.  Tell your health care provider of new moles or changes in moles, especially if there is a change in shape or color. Also, tell your health care provider if a mole is larger than the size of a pencil  eraser.  A one-time screening for abdominal aortic aneurysm (AAA) and surgical repair of large AAAs by ultrasound is recommended for men aged 40-75 years who are current or former smokers.  Stay current with your vaccines (immunizations).   This information is not intended to replace advice given to you by your health care provider. Make sure you discuss any questions you have with your health care provider.   Document Released: 07/27/2007 Document Revised:  02/18/2014 Document Reviewed: 06/25/2010 Elsevier Interactive Patient Education [employer].

## 2015-05-08 NOTE — Progress Notes (Signed)
Patient ID: [name], male   DOB: [birth year], 70 y.o.   MRN: [id number]    Subjective:   Grant Cummings is a 70 y.o. white, male who presents for an Initial Medicare Annual Wellness Visit. He is present with his wife.  He is retired from [employer] and lives in [place], [place].    Over the last year Grant Cummings has had a recurrence of liver cancer with surgery, radiation and chemotherapy.  He had GI damage and bleeding related to radiation.  He current reports he is doing well and has not health related complaints.  Review of Systems  Review of Systems  Constitutional: Negative.   HENT: Negative.   Eyes: Negative.   Respiratory: Negative.   Cardiovascular: Negative.   Gastrointestinal: Positive for heartburn (controlled with protonix).  Genitourinary: Negative.   Musculoskeletal: Negative.   Skin: Negative.   Neurological: Negative.   Endo/Heme/Allergies: Negative.   Psychiatric/Behavioral: Negative.        Current Medications (verified) Outpatient Encounter Prescriptions as of 05/08/2015  Medication Sig  . amLODipine (NORVASC) 10 MG tablet Take 1 tablet (10 mg total) by mouth daily.  Marland Kitchen atorvastatin (LIPITOR) 40 MG tablet Take 1 tablet (40 mg total) by mouth daily.  . cholecalciferol (VITAMIN D) 1000 UNITS tablet Take 1,000 Units by mouth daily.  . hydrochlorothiazide (HYDRODIURIL) 25 MG tablet Take 1 tablet (25 mg total) by mouth daily.  . Multiple Vitamin (MULTI-VITAMINS) TABS Take by mouth.  . pantoprazole (PROTONIX) 40 MG tablet Take 1 tablet (40 mg total) by mouth daily.  . Potassium 99 MG TABS Take 1 tablet by mouth daily.  . [DISCONTINUED] benzonatate (TESSALON) 200 MG capsule Take 1 capsule (200 mg total) by mouth 3 (three) times daily as needed. (Patient not taking: Reported on 05/08/2015)  . [DISCONTINUED] HYDROcodone-homatropine (HYCODAN) 5-1.5 MG/5ML syrup Take 5 mLs by mouth every 8 (eight) hours as needed for cough. (Patient not taking: Reported on 05/08/2015)  .  [DISCONTINUED] oseltamivir (TAMIFLU) 75 MG capsule Take 1 capsule (75 mg total) by mouth 2 (two) times daily. (Patient not taking: Reported on 05/08/2015)   No facility-administered encounter medications on file as of 05/08/2015.    Allergies (verified) Review of patient's allergies indicates no known allergies.   History: Past Medical History  Diagnosis Date  . Liver cancer ([place])   . Hyperlipidemia   . Hypertension   . Cataract    Past Surgical History  Procedure Laterality Date  . Partial liver removal      x 2   . Cholecystectomy    . Knee surgery      left  . Eye surgery Left 2017    lens implant to correct stigmatism / cataract  . Carpal tunnel with cubital tunnel Right    Family History  Problem Relation Age of Onset  . Alzheimer's disease Mother   . Cancer Mother 51    breast  . Alzheimer's disease Father   . Cancer Father     lymph nodes- jaw  . Hypertension Father   . Cancer Sister     ovarian   Social History   Occupational History  . Not on file.   Social History Main Topics  . Smoking status: Never Smoker   . Smokeless tobacco: Never Used  . Alcohol Use: No  . Drug Use: No  . Sexual Activity: Yes    Do you feel safe at home?  Yes  Dietary issues and exercise activities discussed: Current Exercise Habits: Home exercise routine,  Type of exercise: walking, Intensity: Mild  Current Dietary habits:  Eats a variety of fruits and vegetables but does eat out a lot (serveral times per week) Cardiac Risk Factors include: advanced age (>71men, >79 women);dyslipidemia;family history of premature cardiovascular disease;hypertension;sedentary lifestyle  Objective:    Today's Vitals   05/08/15 1009  BP: 159/90  Pulse: 80  Height: 6' 0.25" (1.835 m)  Weight: 222 lb (100.699 kg)  PainSc: 0-No pain   Body mass index is 29.91 kg/(m^2).   Activities of Daily Living In your present state of health, do you have any difficulty performing the following  activities: 05/08/2015  Hearing? N  Vision? N  Difficulty concentrating or making decisions? N  Walking or climbing stairs? N  Dressing or bathing? N  Doing errands, shopping? N  Preparing Food and eating ? N  Using the Toilet? N  In the past six months, have you accidently leaked urine? N  Do you have problems with loss of bowel control? N  Managing your Medications? N  Managing your Finances? N  Housekeeping or managing your Housekeeping? N    Are there smokers in your home (other than you)? No    Depression Screen PHQ 2/9 Scores 05/08/2015 04/17/2015 11/22/2014 05/18/2014  PHQ - 2 Score 0 0 0 0    Fall Risk Fall Risk  05/08/2015 04/17/2015 11/22/2014 05/18/2014 09/20/2013  Falls in the past year? No No No No No    Cognitive Function: MMSE - Mini Mental State Exam 05/08/2015  Orientation to time 5  Orientation to Place 5  Registration 3  Attention/ Calculation 4  Recall 3  Language- name 2 objects 2  Language- repeat 1  Language- follow 3 step command 3  Language- read & follow direction 1  Write a sentence 1  Copy design 0  Total score 28    Immunizations and Health Maintenance Immunization History  Administered Date(s) Administered  . Influenza,inj,Quad PF,36+ Mos 12/21/2013, 11/22/2014  . Pneumococcal Conjugate-13 11/22/2014  . Pneumococcal-Unspecified 12/06/2012   Health Maintenance Due  Topic Date Due  . COLON CANCER SCREENING ANNUAL FOBT  03/10/2011    Patient Care Team: Chipper Herb, MD as PCP - General (Family Medicine) Rodwige Windle Guard, MD as Referring Physician (Hematology and Oncology) Derrill Kay, MD as Consulting Physician (Specialist)  Indicate any recent Medical Services you may have received from other than [facility] providers in the past year (date may be approximate).    Assessment:    Annual Wellness Visit    Screening Tests Health Maintenance  Topic Date Due  . COLON CANCER SCREENING ANNUAL FOBT  03/10/2011  . ZOSTAVAX   06/15/2015 (Originally 02/23/2006)  . Hepatitis C Screening  07/23/2015 (Originally 1945-08-08)  . INFLUENZA VACCINE  09/12/2015  . TETANUS/TDAP  03/22/2017  . COLONOSCOPY  02/12/2019  . PNA vac Low Risk Adult  Completed        Plan:   During the course of the visit Grant Cummings was educated and counseled about the following appropriate screening and preventive services:   Vaccines to include Pneumoccal, Influenza, Hepatitis B, Td, Zostavax - vaccines are UTD except Zostavax and patient declines due to h/o shingles  Colorectal cancer screening - last colonoscopy 2012 - last endoscopy 2016.  Thinks he has FOBT at [facility] but I was unable to locate results.  Cardiovascular disease screening - EKG is UTD  BP was elevated today - patient states BP at home is 140 / 70's.  Recommend decrease salt intake / less  eating out.  Continue to check BP at home and bring to visit with Dr Laurance Flatten in 2 weeks.  Diabetes screening - recheck FBG in 2 weeks with other labs  Glaucoma screening / Eye Exam - UTd  Nutrition counseling - see above about limiting salt intake  Prostate cancer screening - UTD  Physical activity - continue to try to get 150 minutes per week.    Patient Instructions (the written plan) were given to the patient.   Cherre Robins, [facility]   05/08/2015

## 2015-05-31 ENCOUNTER — Encounter: Payer: Self-pay | Admitting: Family Medicine

## 2015-05-31 ENCOUNTER — Ambulatory Visit: Payer: Medicare Other | Admitting: Family Medicine

## 2015-05-31 ENCOUNTER — Ambulatory Visit (INDEPENDENT_AMBULATORY_CARE_PROVIDER_SITE_OTHER): Payer: Medicare Other | Admitting: Family Medicine

## 2015-05-31 ENCOUNTER — Encounter (INDEPENDENT_AMBULATORY_CARE_PROVIDER_SITE_OTHER): Payer: Self-pay

## 2015-05-31 VITALS — BP 137/90 | HR 70 | Temp 97.1°F | Ht 72.25 in | Wt 219.0 lb

## 2015-05-31 DIAGNOSIS — N400 Enlarged prostate without lower urinary tract symptoms: Secondary | ICD-10-CM | POA: Diagnosis not present

## 2015-05-31 DIAGNOSIS — Z1211 Encounter for screening for malignant neoplasm of colon: Secondary | ICD-10-CM | POA: Diagnosis not present

## 2015-05-31 DIAGNOSIS — E785 Hyperlipidemia, unspecified: Secondary | ICD-10-CM

## 2015-05-31 DIAGNOSIS — C229 Malignant neoplasm of liver, not specified as primary or secondary: Secondary | ICD-10-CM

## 2015-05-31 DIAGNOSIS — I10 Essential (primary) hypertension: Secondary | ICD-10-CM | POA: Diagnosis not present

## 2015-05-31 DIAGNOSIS — E559 Vitamin D deficiency, unspecified: Secondary | ICD-10-CM

## 2015-05-31 MED ORDER — PANTOPRAZOLE SODIUM 40 MG PO TBEC: 40.0000 mg | DELAYED_RELEASE_TABLET | Freq: Every day | ORAL | Status: DC

## 2015-05-31 MED ORDER — AMLODIPINE BESYLATE 10 MG PO TABS: 10.0000 mg | ORAL_TABLET | Freq: Every day | ORAL | Status: DC

## 2015-05-31 MED ORDER — HYDROCHLOROTHIAZIDE 25 MG PO TABS: 25.0000 mg | ORAL_TABLET | Freq: Every day | ORAL | Status: DC

## 2015-05-31 MED ORDER — POTASSIUM 99 MG PO TABS
1.0000 | ORAL_TABLET | Freq: Every day | ORAL | Status: DC
Start: 2015-05-31 — End: 2016-12-09

## 2015-05-31 MED ORDER — ATORVASTATIN CALCIUM 40 MG PO TABS: 40.0000 mg | ORAL_TABLET | Freq: Every day | ORAL | Status: DC

## 2015-05-31 NOTE — Progress Notes (Signed)
Subjective:    Patient ID: Grant Cummings, male    DOB: [birth year], 70 y.o.   MRN: [id number]  HPI Pt here for follow up and management of chronic medical problems which includes hypertension and hyperlipidemia. He is taking medications regularly.[facility] is doing well today. He continues to be followed at [facility] for his liver cancer. He has had recent cataract removal. He has a cyst that is being removed from the left side next month. He also has a hernia and the surgeons at [facility] are aware of this. He is requesting refills on all his medicines. He will be given an FOBT to return and will get his routine lab work today. The patient comes to the visit today with his wife. He is doing well. He is thankful. He continues to see the oncologist about every 3 months and has a CT scan planned with contrast sometime in June because of his liver cancer. He is pleasant and alert. He denies any chest pain shortness of breath trouble swallowing heartburn indigestion nausea vomiting diarrhea or blood in the stool. He is passing his water well and frequently due to the medication he is taking.      Patient Active Problem List   Diagnosis Date Noted  . Blood type A- 05/08/2015  . Hernia of abdominal wall 05/08/2015  . Edema 11/22/2014  . BPH (benign prostatic hyperplasia) 03/22/2013  . Vitamin D deficiency 03/22/2013  . Hearing deficit 03/22/2013  . Liver cancer ([place])   . Hyperlipidemia   . Hypertension    Outpatient Encounter Prescriptions as of 05/31/2015  Medication Sig  . amLODipine (NORVASC) 10 MG tablet Take 1 tablet (10 mg total) by mouth daily.  Marland Kitchen atorvastatin (LIPITOR) 40 MG tablet Take 1 tablet (40 mg total) by mouth daily.  . cholecalciferol (VITAMIN D) 1000 UNITS tablet Take 1,000 Units by mouth daily.  . hydrochlorothiazide (HYDRODIURIL) 25 MG tablet Take 1 tablet (25 mg total) by mouth daily.  . Multiple Vitamin (MULTI-VITAMINS) TABS Take by mouth.  .  pantoprazole (PROTONIX) 40 MG tablet Take 1 tablet (40 mg total) by mouth daily.  . Potassium 99 MG TABS Take 1 tablet by mouth daily.   No facility-administered encounter medications on file as of 05/31/2015.     Review of Systems  Constitutional: Negative.   HENT: Negative.   Eyes: Negative.   Respiratory: Negative.   Cardiovascular: Negative.   Gastrointestinal: Negative.   Endocrine: Negative.   Genitourinary: Negative.   Musculoskeletal: Negative.   Skin: Negative.   Allergic/Immunologic: Negative.   Neurological: Negative.   Hematological: Negative.   Psychiatric/Behavioral: Negative.        Objective:   Physical Exam  Constitutional: He is oriented to person, place, and time. He appears well-developed and well-nourished. No distress.  HENT:  Head: Normocephalic and atraumatic.  Right Ear: External ear normal.  Left Ear: External ear normal.  Nose: Nose normal.  Mouth/Throat: Oropharynx is clear and moist. No oropharyngeal exudate.  Eyes: Conjunctivae and EOM are normal. Pupils are equal, round, and reactive to light. Right eye exhibits no discharge. Left eye exhibits no discharge. No scleral icterus.  Neck: Normal range of motion. Neck supple. No thyromegaly present.  No bruits or thyromegaly or adenopathy  Cardiovascular: Normal rate, regular rhythm and normal heart sounds.   No murmur heard. The heart is regular at 84/m  Pulmonary/Chest: Effort normal and breath sounds normal. No respiratory distress. He has no wheezes. He has no rales.  He exhibits no tenderness.  Good breath sounds anteriorly to posteriorly and no axillary adenopathy  Abdominal: Soft. Bowel sounds are normal. He exhibits distension. He exhibits no mass. There is no tenderness. There is no rebound and no guarding.  The patient has an incisional hernia and we'll continue to wear support especially when lifting pushing and pulling. This was apparently recommended to him by his surgeon who is aware of  this incisional hernia. This is a midline abdominal incisional hernia  Musculoskeletal: Normal range of motion. He exhibits no edema.  Lymphadenopathy:    He has no cervical adenopathy.  Neurological: He is alert and oriented to person, place, and time. He has normal reflexes. No cranial nerve deficit.  Skin: Skin is warm and dry. No rash noted.  Psychiatric: He has a normal mood and affect. His behavior is normal. Judgment and thought content normal.  Nursing note and vitals reviewed.  BP 137/90 mmHg  Pulse 70  Temp(Src) 97.1 F (36.2 C) (Oral)  Ht 6' 0.25" (1.835 m)  Wt 219 lb (99.338 kg)  BMI 29.50 kg/m2        Assessment & Plan:  1. Essential hypertension -Continue current treatment and continue to watch sodium intake - BMP8+EGFR - CBC with Differential/Platelet - Hepatic function panel - hydrochlorothiazide (HYDRODIURIL) 25 MG tablet; Take 1 tablet (25 mg total) by mouth daily.  Dispense: 30 tablet; Refill: 11 - amLODipine (NORVASC) 10 MG tablet; Take 1 tablet (10 mg total) by mouth daily.  Dispense: 30 tablet; Refill: 11  2. BPH (benign prostatic hyperplasia) -No symptoms other than nocturia and patient to get some of this to his fluid pill. - CBC with Differential/Platelet  3. Vitamin D deficiency -Continue current treatment pending results of lab work - CBC with Differential/Platelet - VITAMIN D 25 Hydroxy (Vit-D Deficiency, Fractures)  4. Hyperlipidemia -Continue current treatment pending results of lab work - CBC with Differential/Platelet - NMR, lipoprofile - atorvastatin (LIPITOR) 40 MG tablet; Take 1 tablet (40 mg total) by mouth daily.  Dispense: 30 tablet; Refill: 11  5. Special screening for malignant neoplasms, colon -Continue follow-up with oncology and gastroenterology - Fecal occult blood, imunochemical; Future  6. Malignant neoplasm of liver, unspecified liver malignancy type ([place]) -Continue follow-up with oncology and gastroenterology  Meds  ordered this encounter  Medications  . atorvastatin (LIPITOR) 40 MG tablet    Sig: Take 1 tablet (40 mg total) by mouth daily.    Dispense:  30 tablet    Refill:  11  . hydrochlorothiazide (HYDRODIURIL) 25 MG tablet    Sig: Take 1 tablet (25 mg total) by mouth daily.    Dispense:  30 tablet    Refill:  11  . amLODipine (NORVASC) 10 MG tablet    Sig: Take 1 tablet (10 mg total) by mouth daily.    Dispense:  30 tablet    Refill:  11  . pantoprazole (PROTONIX) 40 MG tablet    Sig: Take 1 tablet (40 mg total) by mouth daily.    Dispense:  30 tablet    Refill:  11  . Potassium 99 MG TABS    Sig: Take 1 tablet (99 mg total) by mouth daily.    Dispense:  30 each    Refill:  11   Patient Instructions                       Medicare Annual Wellness Visit  [facility] and the medical providers at [facility] strive to bring you the best medical care.  In doing so we not only want to address your current medical conditions and concerns but also to detect new conditions early and prevent illness, disease and health-related problems.    Medicare offers a yearly Wellness Visit which allows our clinical staff to assess your need for preventative services including immunizations, lifestyle education, counseling to decrease risk of preventable diseases and screening for fall risk and other medical concerns.    This visit is provided free of charge (no copay) for all Medicare recipients. The clinical pharmacists at [facility] have begun to conduct these Wellness Visits which will also include a thorough review of all your medications.    As you primary medical provider recommend that you make an appointment for your Annual Wellness Visit if you have not done so already this year.  You may set up this appointment before you leave today or you may call back [phone]) and schedule an appointment.  Please make sure when you call that you mention that you are  scheduling your Annual Wellness Visit with the clinical pharmacist so that the appointment may be made for the proper length of time.    Continue current medications. Continue good therapeutic lifestyle changes which include good diet and exercise. Fall precautions discussed with patient. If an FOBT was given today- please return it to our front desk. If you are over 37 years old - you may need Prevnar 25 or the adult Pneumonia vaccine.  **Flu shots are available--- please call and schedule a FLU-CLINIC appointment**  After your visit with [place] today you will receive a survey in the mail or online from [employer] regarding your care with [place]. Please take a moment to fill this out. Your feedback is very important to [place] as you can help [place] better understand your patient needs as well as improve your experience and satisfaction. WE CARE ABOUT YOU!!!   The patient should continue to follow-up with his oncologist and his ophthalmologist He should stay active If he develops any abdominal pain he should call his surgeon immediately because of his incisional hernia All medications will be refilled Please take a copy of lab work being done today with you to your next visit to see the oncologist   Arrie Senate MD

## 2015-05-31 NOTE — Patient Instructions (Addendum)
Medicare Annual Wellness Visit  [facility] and the medical providers at [facility] strive to bring you the best medical care.  In doing so we not only want to address your current medical conditions and concerns but also to detect new conditions early and prevent illness, disease and health-related problems.    Medicare offers a yearly Wellness Visit which allows our clinical staff to assess your need for preventative services including immunizations, lifestyle education, counseling to decrease risk of preventable diseases and screening for fall risk and other medical concerns.    This visit is provided free of charge (no copay) for all Medicare recipients. The clinical pharmacists at [facility] have begun to conduct these Wellness Visits which will also include a thorough review of all your medications.    As you primary medical provider recommend that you make an appointment for your Annual Wellness Visit if you have not done so already this year.  You may set up this appointment before you leave today or you may call back [phone] and schedule an appointment.  Please make sure when you call that you mention that you are scheduling your Annual Wellness Visit with the clinical pharmacist so that the appointment may be made for the proper length of time.    Continue current medications. Continue good therapeutic lifestyle changes which include good diet and exercise. Fall precautions discussed with patient. If an FOBT was given today- please return it to our front desk. If you are over 46 years old - you may need Prevnar 24 or the adult Pneumonia vaccine.  **Flu shots are available--- please call and schedule a FLU-CLINIC appointment**  After your visit with [place] today you will receive a survey in the mail or online from [employer] regarding your care with [place]. Please take a moment to fill this out. Your feedback is very  important to [place] as you can help [place] better understand your patient needs as well as improve your experience and satisfaction. WE CARE ABOUT YOU!!!   The patient should continue to follow-up with his oncologist and his ophthalmologist He should stay active If he develops any abdominal pain he should call his surgeon immediately because of his incisional hernia All medications will be refilled Please take a copy of lab work being done today with you to your next visit to see the oncologist

## 2015-06-02 LAB — CBC WITH DIFFERENTIAL/PLATELET
BASOS ABS: 0 10*3/uL (ref 0.0–0.2)
Basos: 0 %
EOS (ABSOLUTE): 0.1 10*3/uL (ref 0.0–0.4)
Eos: 1 %
HEMATOCRIT: 42.4 % (ref 37.5–51.0)
Hemoglobin: 14.2 g/dL (ref 12.6–17.7)
Immature Grans (Abs): 0 10*3/uL (ref 0.0–0.1)
Immature Granulocytes: 0 %
LYMPHS ABS: 0.4 10*3/uL — AB (ref 0.7–3.1)
Lymphs: 8 %
MCH: 32.4 pg (ref 26.6–33.0)
MCHC: 33.5 g/dL (ref 31.5–35.7)
MCV: 97 fL (ref 79–97)
MONOS ABS: 0.6 10*3/uL (ref 0.1–0.9)
Monocytes: 12 %
Neutrophils Absolute: 4.2 10*3/uL (ref 1.4–7.0)
Neutrophils: 79 %
Platelets: 151 10*3/uL (ref 150–379)
RBC: 4.38 x10E6/uL (ref 4.14–5.80)
RDW: 14.5 % (ref 12.3–15.4)
WBC: 5.3 10*3/uL (ref 3.4–10.8)

## 2015-06-02 LAB — HEPATIC FUNCTION PANEL
ALBUMIN: 4.2 g/dL (ref 3.6–4.8)
ALT: 37 IU/L (ref 0–44)
AST: 38 IU/L (ref 0–40)
Alkaline Phosphatase: 75 IU/L (ref 39–117)
Bilirubin Total: 0.6 mg/dL (ref 0.0–1.2)
Bilirubin, Direct: 0.21 mg/dL (ref 0.00–0.40)
TOTAL PROTEIN: 6.3 g/dL (ref 6.0–8.5)

## 2015-06-02 LAB — NMR, LIPOPROFILE
Cholesterol: 132 mg/dL (ref 100–199)
HDL CHOLESTEROL BY NMR: 50 mg/dL (ref >39)
HDL PARTICLE NUMBER: 33.9 umol/L (ref >=30.5)
LDL PARTICLE NUMBER: 788 nmol/L (ref <1000)
LDL Size: 20.7 nm (ref >20.5)
LDL-C: 64 mg/dL (ref 0–99)
LP-IR Score: 31 (ref <=45)
Small LDL Particle Number: 248 nmol/L (ref <=527)
Triglycerides by NMR: 89 mg/dL (ref 0–149)

## 2015-06-02 LAB — BMP8+EGFR
BUN / CREAT RATIO: 17 (ref 10–24)
BUN: 20 mg/dL (ref 8–27)
CHLORIDE: 99 mmol/L (ref 96–106)
CO2: 24 mmol/L (ref 18–29)
Calcium: 9.3 mg/dL (ref 8.6–10.2)
Creatinine, Ser: 1.15 mg/dL (ref 0.76–1.27)
GFR calc Af Amer: 75 mL/min/{1.73_m2} (ref >59)
GFR calc non Af Amer: 65 mL/min/{1.73_m2} (ref >59)
GLUCOSE: 107 mg/dL — AB (ref 65–99)
Potassium: 3.9 mmol/L (ref 3.5–5.2)
SODIUM: 140 mmol/L (ref 134–144)

## 2015-06-02 LAB — VITAMIN D 25 HYDROXY (VIT D DEFICIENCY, FRACTURES): VIT D 25 HYDROXY: 37.1 ng/mL (ref 30.0–100.0)

## 2015-06-28 ENCOUNTER — Ambulatory Visit: Payer: Medicare Other | Admitting: Family Medicine

## 2015-12-01 ENCOUNTER — Ambulatory Visit (INDEPENDENT_AMBULATORY_CARE_PROVIDER_SITE_OTHER): Payer: Medicare Other | Admitting: Family Medicine

## 2015-12-01 ENCOUNTER — Encounter: Payer: Self-pay | Admitting: Family Medicine

## 2015-12-01 VITALS — BP 124/83 | HR 73 | Temp 98.0°F | Ht 72.25 in | Wt 221.0 lb

## 2015-12-01 DIAGNOSIS — Z23 Encounter for immunization: Secondary | ICD-10-CM

## 2015-12-01 DIAGNOSIS — N400 Enlarged prostate without lower urinary tract symptoms: Secondary | ICD-10-CM | POA: Diagnosis not present

## 2015-12-01 DIAGNOSIS — E559 Vitamin D deficiency, unspecified: Secondary | ICD-10-CM | POA: Diagnosis not present

## 2015-12-01 DIAGNOSIS — I10 Essential (primary) hypertension: Secondary | ICD-10-CM | POA: Diagnosis not present

## 2015-12-01 DIAGNOSIS — C220 Liver cell carcinoma: Secondary | ICD-10-CM

## 2015-12-01 DIAGNOSIS — E7800 Pure hypercholesterolemia, unspecified: Secondary | ICD-10-CM | POA: Diagnosis not present

## 2015-12-01 DIAGNOSIS — C229 Malignant neoplasm of liver, not specified as primary or secondary: Secondary | ICD-10-CM | POA: Diagnosis not present

## 2015-12-01 LAB — URINALYSIS, COMPLETE
Bilirubin, UA: NEGATIVE
Glucose, UA: NEGATIVE
Ketones, UA: NEGATIVE
Leukocytes, UA: NEGATIVE
NITRITE UA: NEGATIVE
RBC, UA: NEGATIVE
Specific Gravity, UA: 1.02 (ref 1.005–1.030)
Urobilinogen, Ur: 1 mg/dL (ref 0.2–1.0)
pH, UA: 7 (ref 5.0–7.5)

## 2015-12-01 LAB — MICROSCOPIC EXAMINATION
BACTERIA UA: NONE SEEN
EPITHELIAL CELLS (NON RENAL): NONE SEEN /HPF (ref 0–10)
RBC MICROSCOPIC, UA: NONE SEEN /HPF (ref 0–?)
WBC, UA: NONE SEEN /hpf (ref 0–?)

## 2015-12-01 NOTE — Patient Instructions (Addendum)
Medicare Annual Wellness Visit  [facility] and the medical providers at [facility] strive to bring you the best medical care.  In doing so we not only want to address your current medical conditions and concerns but also to detect new conditions early and prevent illness, disease and health-related problems.    Medicare offers a yearly Wellness Visit which allows our clinical staff to assess your need for preventative services including immunizations, lifestyle education, counseling to decrease risk of preventable diseases and screening for fall risk and other medical concerns.    This visit is provided free of charge (no copay) for all Medicare recipients. The clinical pharmacists at [facility] have begun to conduct these Wellness Visits which will also include a thorough review of all your medications.    As you primary medical provider recommend that you make an appointment for your Annual Wellness Visit if you have not done so already this year.  You may set up this appointment before you leave today or you may call back [phone] and schedule an appointment.  Please make sure when you call that you mention that you are scheduling your Annual Wellness Visit with the clinical pharmacist so that the appointment may be made for the proper length of time.     Continue current medications. Continue good therapeutic lifestyle changes which include good diet and exercise. Fall precautions discussed with patient. If an FOBT was given today- please return it to our front desk. If you are over 75 years old - you may need Prevnar 77 or the adult Pneumonia vaccine.  **Flu shots are available--- please call and schedule a FLU-CLINIC appointment**  After your visit with [place] today you will receive a survey in the mail or online from [employer] regarding your care with [place]. Please take a moment to fill this out. Your feedback is very  important to [place] as you can help [place] better understand your patient needs as well as improve your experience and satisfaction. WE CARE ABOUT YOU!!!   The patient should continue to follow-up with his oncologist and specialist at [facility]. He had a CT scan of the chest in February of this year.

## 2015-12-01 NOTE — Progress Notes (Signed)
Subjective:    Patient ID: Grant Cummings, male    DOB: 17-Sep-1945, 70 y.o.   MRN: [id number]  HPI Pt here for follow up and management of chronic medical problems which includes hyperlipidemia. He is taking medications regularly.The patient is doing well. He does complain of some swelling in his leg. He needs refills on all his medications. He has had several bouts with liver cancer and is followed by his oncologist and specialist at [facility]. He is due a PSA and prostate today. He will also get his flu shot today. The patient comes to the visit today with his wife. He is recently had an ablation of a nodule in his liver and the biopsy of the nodule was not cancerous but they ablated it anyway to be on the safe side. Other than some soreness following the ablation the patient is doing much better. He is in good spirits. He denies any chest pain or shortness of breath. He denies any trouble with heartburn indigestion nausea vomiting or blood in the stool. He does have occasional bouts of loose bowel movements and this is not unusual for him. He also describes some nocturia 1 but no other symptoms than that with passing his water. He is pleasant and alert.    Patient Active Problem List   Diagnosis Date Noted  . Blood type A- 05/08/2015  . Hernia of abdominal wall 05/08/2015  . Edema 11/22/2014  . BPH (benign prostatic hyperplasia) 03/22/2013  . Vitamin D deficiency 03/22/2013  . Hearing deficit 03/22/2013  . Liver cancer ([place])   . Hyperlipidemia   . Hypertension    Outpatient Encounter Prescriptions as of 12/01/2015  Medication Sig  . amLODipine (NORVASC) 10 MG tablet Take 1 tablet (10 mg total) by mouth daily.  Marland Kitchen atorvastatin (LIPITOR) 40 MG tablet Take 1 tablet (40 mg total) by mouth daily.  . cholecalciferol (VITAMIN D) 1000 UNITS tablet Take 1,000 Units by mouth daily.  . hydrochlorothiazide (HYDRODIURIL) 25 MG tablet Take 1 tablet (25 mg total) by  mouth daily.  . Multiple Vitamin (MULTI-VITAMINS) TABS Take by mouth.  . pantoprazole (PROTONIX) 40 MG tablet Take 1 tablet (40 mg total) by mouth daily.  . Potassium 99 MG TABS Take 1 tablet (99 mg total) by mouth daily.   No facility-administered encounter medications on file as of 12/01/2015.       Review of Systems  Constitutional: Negative.   HENT: Negative.   Eyes: Negative.   Respiratory: Negative.   Cardiovascular: Positive for leg swelling (lower).  Gastrointestinal: Negative.   Endocrine: Negative.   Genitourinary: Negative.   Musculoskeletal: Negative.   Skin: Negative.   Allergic/Immunologic: Negative.   Neurological: Negative.   Hematological: Negative.   Psychiatric/Behavioral: Negative.        Objective:   Physical Exam  Constitutional: He is oriented to person, place, and time. He appears well-developed and well-nourished. No distress.  Patient is pleasant and relaxed and in good spirits  HENT:  Head: Normocephalic and atraumatic.  Right Ear: External ear normal.  Left Ear: External ear normal.  Nose: Nose normal.  Mouth/Throat: Oropharynx is clear and moist. No oropharyngeal exudate.  Eyes: Conjunctivae and EOM are normal. Pupils are equal, round, and reactive to light. Right eye exhibits no discharge. Left eye exhibits no discharge. No scleral icterus.  Neck: Normal range of motion. Neck supple. No thyromegaly present.  No thyromegaly bruits or anterior cervical adenopathy  Cardiovascular: Normal rate, regular rhythm, normal  heart sounds and intact distal pulses.   No murmur heard. The heart has a regular rate and rhythm at 72/m  Pulmonary/Chest: Effort normal and breath sounds normal. No respiratory distress. He has no wheezes. He has no rales. He exhibits no tenderness.  No axillary adenopathy and lungs are clear anteriorly and posteriorly  Abdominal: Soft. Bowel sounds are normal. He exhibits no mass. There is no tenderness. There is no rebound and no  guarding.  Patient has an incisional hernia. There is no liver or spleen enlargement there are no masses palpable and there is no inguinal adenopathy. There is no suprapubic tenderness.  Genitourinary: Rectum normal and penis normal.  Genitourinary Comments: The prostate is slightly enlarged but smooth is no rectal masses. There are no inguinal hernias present. The external genitalia were within normal limits.  Musculoskeletal: Normal range of motion. He exhibits edema.  There is slight edema on the right upper leg and this is the side that he had superficial thrombophlebitis in. When he wakes up in the morning the edema is not present and only accumulates during the day.  Lymphadenopathy:    He has no cervical adenopathy.  Neurological: He is alert and oriented to person, place, and time. He has normal reflexes. No cranial nerve deficit.  Skin: Skin is warm and dry. No rash noted.  Psychiatric: He has a normal mood and affect. His behavior is normal. Judgment and thought content normal.  Nursing note and vitals reviewed.   BP 124/83 (BP Location: Right Arm)   Pulse 73   Temp 98 F (36.7 C) (Oral)   Ht 6' 0.25" (1.835 m)   Wt 221 lb (100.2 kg)   BMI 29.77 kg/m        Assessment & Plan:  1. Essential hypertension -The blood pressure is good and the vital signs are stable. - BMP8+EGFR - CBC with Differential/Platelet - Hepatic function panel  2. Vitamin D deficiency -Continue with vitamin D replacement pending results of lab work - CBC with Differential/Platelet - VITAMIN D 25 Hydroxy (Vit-D Deficiency, Fractures)  3. Benign prostatic hyperplasia, unspecified whether lower urinary tract symptoms present -The patient is having no symptoms with his enlarged prostate other than nocturia 1. We will continue to monitor this we will also be checking a urinalysis today. - CBC with Differential/Platelet - Urinalysis, Complete - PSA, total and free  4. Pure  hypercholesterolemia -Continue with atorvastatin and aggressive therapeutic lifestyle changes - CBC with Differential/Platelet - NMR, lipoprofile  5. Malignant neoplasm of liver, unspecified liver malignancy type ([place]) -Continue follow-up with oncology and follow through with scans that are planned by them  6. 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 Encounter for immunization - Flu vaccine HIGH DOSE PF  All medications will be refilled today.  Patient Instructions                       Medicare Annual Wellness Visit  [facility] and the medical providers at [facility] strive to bring you the best medical care.  In doing so we not only want to address your current medical conditions and concerns but also to detect new conditions early and prevent illness, disease and health-related problems.    Medicare offers a yearly Wellness Visit which allows our clinical staff to assess your need for preventative services including immunizations, lifestyle education, counseling to decrease risk of preventable diseases and screening for fall risk and other medical concerns.  This visit is provided free of charge (no copay) for all Medicare recipients. The clinical pharmacists at [facility] have begun to conduct these Wellness Visits which will also include a thorough review of all your medications.    As you primary medical provider recommend that you make an appointment for your Annual Wellness Visit if you have not done so already this year.  You may set up this appointment before you leave today or you may call back [phone]) and schedule an appointment.  Please make sure when you call that you mention that you are scheduling your Annual Wellness Visit with the clinical pharmacist so that the appointment may be made for the proper length of time.     Continue current medications. Continue good therapeutic lifestyle  changes which include good diet and exercise. Fall precautions discussed with patient. If an FOBT was given today- please return it to our front desk. If you are over 10 years old - you may need Prevnar 25 or the adult Pneumonia vaccine.  **Flu shots are available--- please call and schedule a FLU-CLINIC appointment**  After your visit with [place] today you will receive a survey in the mail or online from [employer] regarding your care with [place]. Please take a moment to fill this out. Your feedback is very important to [place] as you can help [place] better understand your patient needs as well as improve your experience and satisfaction. WE CARE ABOUT YOU!!!   The patient should continue to follow-up with his oncologist and specialist at [facility]. He had a CT scan of the chest in February of this year.   Arrie Senate MD

## 2015-12-07 ENCOUNTER — Other Ambulatory Visit (INDEPENDENT_AMBULATORY_CARE_PROVIDER_SITE_OTHER): Payer: Medicare Other

## 2015-12-08 LAB — CBC WITH DIFFERENTIAL/PLATELET
BASOS ABS: 0 10*3/uL (ref 0.0–0.2)
Basos: 0 %
EOS (ABSOLUTE): 0.1 10*3/uL (ref 0.0–0.4)
Eos: 2 %
Hematocrit: 40.4 % (ref 37.5–51.0)
Hemoglobin: 13 g/dL (ref 12.6–17.7)
IMMATURE GRANS (ABS): 0 10*3/uL (ref 0.0–0.1)
IMMATURE GRANULOCYTES: 0 %
LYMPHS: 12 %
Lymphocytes Absolute: 0.6 10*3/uL — ABNORMAL LOW (ref 0.7–3.1)
MCH: 29.7 pg (ref 26.6–33.0)
MCHC: 32.2 g/dL (ref 31.5–35.7)
MCV: 92 fL (ref 79–97)
MONOCYTES: 12 %
Monocytes Absolute: 0.6 10*3/uL (ref 0.1–0.9)
NEUTROS PCT: 74 %
Neutrophils Absolute: 3.7 10*3/uL (ref 1.4–7.0)
PLATELETS: 165 10*3/uL (ref 150–379)
RBC: 4.38 x10E6/uL (ref 4.14–5.80)
RDW: 14.7 % (ref 12.3–15.4)
WBC: 5 10*3/uL (ref 3.4–10.8)

## 2015-12-08 LAB — NMR, LIPOPROFILE
CHOLESTEROL: 125 mg/dL (ref 100–199)
HDL CHOLESTEROL BY NMR: 43 mg/dL (ref >39)
HDL PARTICLE NUMBER: 28.9 umol/L — AB (ref >=30.5)
LDL Particle Number: 868 nmol/L (ref <1000)
LDL Size: 20.8 nm (ref >20.5)
LDL-C: 68 mg/dL (ref 0–99)
LP-IR Score: 40 (ref <=45)
SMALL LDL PARTICLE NUMBER: 234 nmol/L (ref <=527)
TRIGLYCERIDES BY NMR: 69 mg/dL (ref 0–149)

## 2015-12-08 LAB — BMP8+EGFR
BUN / CREAT RATIO: 19 (ref 10–24)
BUN: 20 mg/dL (ref 8–27)
CO2: 27 mmol/L (ref 18–29)
CREATININE: 1.03 mg/dL (ref 0.76–1.27)
Calcium: 9.1 mg/dL (ref 8.6–10.2)
Chloride: 102 mmol/L (ref 96–106)
GFR calc non Af Amer: 74 mL/min/{1.73_m2} (ref >59)
GFR, EST AFRICAN AMERICAN: 85 mL/min/{1.73_m2} (ref >59)
GLUCOSE: 108 mg/dL — AB (ref 65–99)
Potassium: 3.8 mmol/L (ref 3.5–5.2)
SODIUM: 144 mmol/L (ref 134–144)

## 2015-12-08 LAB — HEPATIC FUNCTION PANEL
ALBUMIN: 3.9 g/dL (ref 3.6–4.8)
ALT: 31 IU/L (ref 0–44)
AST: 34 IU/L (ref 0–40)
Alkaline Phosphatase: 76 IU/L (ref 39–117)
BILIRUBIN TOTAL: 0.5 mg/dL (ref 0.0–1.2)
BILIRUBIN, DIRECT: 0.17 mg/dL (ref 0.00–0.40)
TOTAL PROTEIN: 6.2 g/dL (ref 6.0–8.5)

## 2015-12-08 LAB — VITAMIN D 25 HYDROXY (VIT D DEFICIENCY, FRACTURES): VIT D 25 HYDROXY: 35.3 ng/mL (ref 30.0–100.0)

## 2015-12-08 LAB — PSA, TOTAL AND FREE
PSA FREE: 0.05 ng/mL
PSA, Free Pct: 25 %
Prostate Specific Ag, Serum: 0.2 ng/mL (ref 0.0–4.0)

## 2016-05-30 ENCOUNTER — Ambulatory Visit (INDEPENDENT_AMBULATORY_CARE_PROVIDER_SITE_OTHER): Payer: Medicare Other

## 2016-05-30 ENCOUNTER — Ambulatory Visit (INDEPENDENT_AMBULATORY_CARE_PROVIDER_SITE_OTHER): Payer: Medicare Other | Admitting: Family Medicine

## 2016-05-30 ENCOUNTER — Encounter: Payer: Self-pay | Admitting: Family Medicine

## 2016-05-30 VITALS — BP 138/87 | HR 73 | Temp 97.2°F | Ht 72.25 in | Wt 224.0 lb

## 2016-05-30 DIAGNOSIS — I10 Essential (primary) hypertension: Secondary | ICD-10-CM

## 2016-05-30 DIAGNOSIS — C220 Liver cell carcinoma: Secondary | ICD-10-CM

## 2016-05-30 DIAGNOSIS — N400 Enlarged prostate without lower urinary tract symptoms: Secondary | ICD-10-CM | POA: Diagnosis not present

## 2016-05-30 DIAGNOSIS — E7800 Pure hypercholesterolemia, unspecified: Secondary | ICD-10-CM

## 2016-05-30 DIAGNOSIS — M25511 Pain in right shoulder: Secondary | ICD-10-CM

## 2016-05-30 DIAGNOSIS — E559 Vitamin D deficiency, unspecified: Secondary | ICD-10-CM | POA: Diagnosis not present

## 2016-05-30 NOTE — Progress Notes (Signed)
Subjective:    Patient ID: Grant Cummings, male    DOB: Sep 21, 1945, 71 y.o.   MRN: [id number]  HPI Pt here for follow up and management of chronic medical problems which includes hyperlipidemia and hypertension. He is taking medication regularly.The patient is requesting refills on all his medicines. He will get lab work today and we will make sure that we've forte a copy of that report to his specialists in [place]. He is complaining today of some right shoulder pain. He will be given an FOBT to return. Recent lab work from [facility] was reviewed prior to seeing the patient. The patient denies any chest pain or shortness of breath. The patient denies any trouble with his intestinal tract including nausea vomiting diarrhea blood in the stool or black tarry bowel movements. He does indicate that he will have a large loose stool if he does not get up and eat regularly in the morning. There is no blood in the stool. There are no black tarry bowel movements. The patient is passing his water without problems. He does indicate that he recently slipped on some leaves and landed on his right elbow and he is been having some shoulder pain which seems to be worse at nighttime. We will get x-rays of the shoulder today. The injury occurred 2-3 months ago. The patient does indicate that he is seen every 3 months by the oncologist at [facility] for his liver cancer. Every 6 months, he gets a CT scan of the abdomen in the next CT scan is due in June of this year.    Patient Active Problem List   Diagnosis Date Noted  . Blood type A- 05/08/2015  . Hernia of abdominal wall 05/08/2015  . Edema 11/22/2014  . BPH (benign prostatic hyperplasia) 03/22/2013  . Vitamin D deficiency 03/22/2013  . Hearing deficit 03/22/2013  . Liver cancer ([place])   . Hyperlipidemia   . Hypertension    Outpatient Encounter Prescriptions as of 05/30/2016  Medication Sig    . amLODipine (NORVASC) 10 MG tablet Take 1 tablet (10 mg total) by mouth daily.  Marland Kitchen atorvastatin (LIPITOR) 40 MG tablet Take 1 tablet (40 mg total) by mouth daily.  . cholecalciferol (VITAMIN D) 1000 UNITS tablet Take 1,000 Units by mouth daily.  . hydrochlorothiazide (HYDRODIURIL) 25 MG tablet Take 1 tablet (25 mg total) by mouth daily.  . Multiple Vitamin (MULTI-VITAMINS) TABS Take by mouth.  . pantoprazole (PROTONIX) 40 MG tablet Take 1 tablet (40 mg total) by mouth daily.  . Potassium 99 MG TABS Take 1 tablet (99 mg total) by mouth daily.   No facility-administered encounter medications on file as of 05/30/2016.       Review of Systems  Constitutional: Negative.   HENT: Negative.   Eyes: Negative.   Respiratory: Negative.   Cardiovascular: Negative.   Gastrointestinal: Negative.   Endocrine: Negative.   Genitourinary: Negative.   Musculoskeletal: Positive for arthralgias (right shoulder pain - from fall in FEB).  Skin: Negative.   Allergic/Immunologic: Negative.   Neurological: Negative.   Hematological: Negative.   Psychiatric/Behavioral: Negative.        Objective:   Physical Exam  Constitutional: He is oriented to person, place, and time. He appears well-developed and well-nourished. No distress.  HENT:  Head: Normocephalic and atraumatic.  Right Ear: External ear normal.  Left Ear: External ear normal.  Mouth/Throat: Oropharynx is clear and moist. No oropharyngeal exudate.  Slight nasal  congestion left greater than right  Eyes: Conjunctivae and EOM are normal. Pupils are equal, round, and reactive to light. Right eye exhibits no discharge. Left eye exhibits no discharge. No scleral icterus.  Neck: Normal range of motion. Neck supple. No thyromegaly present.  No thyromegaly or anterior cervical adenopathy  Cardiovascular: Normal rate, regular rhythm and intact distal pulses.   No murmur heard. The heart is regular at 72/m with only a rare PVC.  Pulmonary/Chest:  Effort normal and breath sounds normal. No respiratory distress. He has no wheezes. He has no rales. He exhibits no tenderness.  Clear anteriorly and posteriorly no axillary adenopathy  Abdominal: Soft. Bowel sounds are normal. He exhibits no mass. There is no tenderness. There is no rebound and no guarding.  Patient does have an incisional hernia and he is aware of this and knows that if he gets severe pain with this that he should go to the emergency room immediately. Otherwise no masses liver enlargement or inguinal adenopathy  Musculoskeletal: Normal range of motion. He exhibits no edema or tenderness.  There is some tenderness in the biceps tendon area of the right shoulder. There is slight tenderness in the insertion of the deltoid tendon. There is fairly good range of motion.  Lymphadenopathy:    He has no cervical adenopathy.  Neurological: He is alert and oriented to person, place, and time. He has normal reflexes. No cranial nerve deficit.  Skin: Skin is warm and dry. No rash noted.  Psychiatric: He has a normal mood and affect. His behavior is normal. Judgment and thought content normal.  Nursing note and vitals reviewed.   BP 138/87 (BP Location: Left Arm)   Pulse 73   Temp 97.2 F (36.2 C) (Oral)   Ht 6' 0.25" (1.835 m)   Wt 224 lb (101.6 kg)   BMI 30.17 kg/m   X-ray of right shoulder with results pending    Assessment & Plan:  1. Essential hypertension -The blood pressure is good today and the patient will continue with current treatment  2. Vitamin D deficiency -Continue with current treatment pending results of lab work  3. Pure hypercholesterolemia -Continue with aggressive therapeutic lifestyle changes and atorvastatin pending results of lab work  4. Benign prostatic hyperplasia, unspecified whether lower urinary tract symptoms present -The patient has no complaints with voiding today.  5. Right shoulder pain, unspecified chronicity -The right shoulder pain  has been going on for the past 3-4 months since he had a fall on some wet leaves and landed on his elbow on that side.  6. Hepatocellular carcinoma ([place]) -Tinny to follow-up with oncology at [facility]  No orders of the defined types were placed in this encounter.  Patient Instructions                       Medicare Annual Wellness Visit  [facility] and the medical providers at [facility] strive to bring you the best medical care.  In doing so we not only want to address your current medical conditions and concerns but also to detect new conditions early and prevent illness, disease and health-related problems.    Medicare offers a yearly Wellness Visit which allows our clinical staff to assess your need for preventative services including immunizations, lifestyle education, counseling to decrease risk of preventable diseases and screening for fall risk and other medical concerns.    This visit is provided free of charge (no copay)  for all Medicare recipients. The clinical pharmacists at [facility] have begun to conduct these Wellness Visits which will also include a thorough review of all your medications.    As you primary medical provider recommend that you make an appointment for your Annual Wellness Visit if you have not done so already this year.  You may set up this appointment before you leave today or you may call back [phone]) and schedule an appointment.  Please make sure when you call that you mention that you are scheduling your Annual Wellness Visit with the clinical pharmacist so that the appointment may be made for the proper length of time.     Continue current medications. Continue good therapeutic lifestyle changes which include good diet and exercise. Fall precautions discussed with patient. If an FOBT was given today- please return it to our front desk. If you are over 46 years old - you may need  Prevnar 78 or the adult Pneumonia vaccine.  **Flu shots are available--- please call and schedule a FLU-CLINIC appointment**  After your visit with [place] today you will receive a survey in the mail or online from [employer] regarding your care with [place]. Please take a moment to fill this out. Your feedback is very important to [place] as you can help [place] better understand your patient needs as well as improve your experience and satisfaction. WE CARE ABOUT YOU!!!   Continue to follow-up with oncology for liver cancer Use warm wet compresses to the right shoulder, use some Solon Poss, and take occasional Tylenol if needed for pain and do range of motion exercises    Arrie Senate MD

## 2016-05-30 NOTE — Patient Instructions (Addendum)
Medicare Annual Wellness Visit  [place] and the medical providers at [facility] strive to bring you the best medical care.  In doing so we not only want to address your current medical conditions and concerns but also to detect new conditions early and prevent illness, disease and health-related problems.    Medicare offers a yearly Wellness Visit which allows our clinical staff to assess your need for preventative services including immunizations, lifestyle education, counseling to decrease risk of preventable diseases and screening for fall risk and other medical concerns.    This visit is provided free of charge (no copay) for all Medicare recipients. The clinical pharmacists at [facility] have begun to conduct these Wellness Visits which will also include a thorough review of all your medications.    As you primary medical provider recommend that you make an appointment for your Annual Wellness Visit if you have not done so already this year.  You may set up this appointment before you leave today or you may call back [phone]) and schedule an appointment.  Please make sure when you call that you mention that you are scheduling your Annual Wellness Visit with the clinical pharmacist so that the appointment may be made for the proper length of time.     Continue current medications. Continue good therapeutic lifestyle changes which include good diet and exercise. Fall precautions discussed with patient. If an FOBT was given today- please return it to our front desk. If you are over 39 years old - you may need Prevnar 70 or the adult Pneumonia vaccine.  **Flu shots are available--- please call and schedule a FLU-CLINIC appointment**  After your visit with [place] today you will receive a survey in the mail or online from [employer] regarding your care with [place]. Please take a moment to fill this out. Your feedback is very  important to [place] as you can help [place] better understand your patient needs as well as improve your experience and satisfaction. WE CARE ABOUT YOU!!!   Continue to follow-up with oncology for liver cancer Use warm wet compresses to the right shoulder, use some Solon Poss, and take occasional Tylenol if needed for pain and do range of motion exercises

## 2016-05-30 NOTE — Addendum Note (Signed)
Addended by: Zannie Cove on: 05/30/2016 09:16 AM   Modules accepted: Orders

## 2016-05-31 LAB — CBC WITH DIFFERENTIAL/PLATELET
Basophils Absolute: 0 10*3/uL (ref 0.0–0.2)
Basos: 0 %
EOS (ABSOLUTE): 0.1 10*3/uL (ref 0.0–0.4)
EOS: 1 %
HEMATOCRIT: 41.3 % (ref 37.5–51.0)
HEMOGLOBIN: 13.3 g/dL (ref 13.0–17.7)
Immature Grans (Abs): 0 10*3/uL (ref 0.0–0.1)
Immature Granulocytes: 0 %
LYMPHS ABS: 0.7 10*3/uL (ref 0.7–3.1)
Lymphs: 13 %
MCH: 29 pg (ref 26.6–33.0)
MCHC: 32.2 g/dL (ref 31.5–35.7)
MCV: 90 fL (ref 79–97)
MONOS ABS: 0.5 10*3/uL (ref 0.1–0.9)
Monocytes: 10 %
Neutrophils Absolute: 3.9 10*3/uL (ref 1.4–7.0)
Neutrophils: 76 %
Platelets: 190 10*3/uL (ref 150–379)
RBC: 4.59 x10E6/uL (ref 4.14–5.80)
RDW: 15.5 % — ABNORMAL HIGH (ref 12.3–15.4)
WBC: 5.2 10*3/uL (ref 3.4–10.8)

## 2016-05-31 LAB — HEPATIC FUNCTION PANEL
ALK PHOS: 78 IU/L (ref 39–117)
ALT: 31 IU/L (ref 0–44)
AST: 31 IU/L (ref 0–40)
Albumin: 4.4 g/dL (ref 3.5–4.8)
BILIRUBIN TOTAL: 0.6 mg/dL (ref 0.0–1.2)
BILIRUBIN, DIRECT: 0.16 mg/dL (ref 0.00–0.40)
Total Protein: 6.8 g/dL (ref 6.0–8.5)

## 2016-05-31 LAB — BMP8+EGFR
BUN / CREAT RATIO: 20 (ref 10–24)
BUN: 20 mg/dL (ref 8–27)
CHLORIDE: 103 mmol/L (ref 96–106)
CO2: 22 mmol/L (ref 18–29)
CREATININE: 1.02 mg/dL (ref 0.76–1.27)
Calcium: 9.2 mg/dL (ref 8.6–10.2)
GFR calc Af Amer: 86 mL/min/{1.73_m2} (ref >59)
GFR calc non Af Amer: 74 mL/min/{1.73_m2} (ref >59)
GLUCOSE: 105 mg/dL — AB (ref 65–99)
Potassium: 3.7 mmol/L (ref 3.5–5.2)
SODIUM: 144 mmol/L (ref 134–144)

## 2016-05-31 LAB — NMR, LIPOPROFILE
Cholesterol: 130 mg/dL (ref 100–199)
HDL Cholesterol by NMR: 43 mg/dL (ref >39)
HDL Particle Number: 32 umol/L (ref >=30.5)
LDL Particle Number: 1000 nmol/L — ABNORMAL HIGH (ref <1000)
LDL Size: 20.4 nm (ref >20.5)
LDL-C: 69 mg/dL (ref 0–99)
LP-IR Score: 63 — ABNORMAL HIGH (ref <=45)
Small LDL Particle Number: 546 nmol/L — ABNORMAL HIGH (ref <=527)
TRIGLYCERIDES BY NMR: 88 mg/dL (ref 0–149)

## 2016-05-31 LAB — VITAMIN D 25 HYDROXY (VIT D DEFICIENCY, FRACTURES): Vit D, 25-Hydroxy: 33.2 ng/mL (ref 30.0–100.0)

## 2016-06-04 ENCOUNTER — Other Ambulatory Visit: Payer: Self-pay | Admitting: Family Medicine

## 2016-06-04 DIAGNOSIS — I10 Essential (primary) hypertension: Secondary | ICD-10-CM

## 2016-06-04 DIAGNOSIS — E785 Hyperlipidemia, unspecified: Secondary | ICD-10-CM

## 2016-08-27 ENCOUNTER — Other Ambulatory Visit: Payer: Self-pay | Admitting: Family Medicine

## 2016-08-27 DIAGNOSIS — E785 Hyperlipidemia, unspecified: Secondary | ICD-10-CM

## 2016-08-27 DIAGNOSIS — I10 Essential (primary) hypertension: Secondary | ICD-10-CM

## 2016-11-27 ENCOUNTER — Other Ambulatory Visit: Payer: Self-pay | Admitting: Family Medicine

## 2016-11-27 DIAGNOSIS — I10 Essential (primary) hypertension: Secondary | ICD-10-CM

## 2016-11-28 NOTE — Telephone Encounter (Signed)
OV 11/29/16

## 2016-11-29 ENCOUNTER — Encounter: Payer: Self-pay | Admitting: Family Medicine

## 2016-11-29 ENCOUNTER — Ambulatory Visit (INDEPENDENT_AMBULATORY_CARE_PROVIDER_SITE_OTHER): Payer: Medicare Other | Admitting: Family Medicine

## 2016-11-29 VITALS — BP 147/82 | HR 72 | Temp 97.2°F | Ht 72.25 in | Wt 226.0 lb

## 2016-11-29 DIAGNOSIS — C220 Liver cell carcinoma: Secondary | ICD-10-CM

## 2016-11-29 DIAGNOSIS — I10 Essential (primary) hypertension: Secondary | ICD-10-CM

## 2016-11-29 DIAGNOSIS — Z23 Encounter for immunization: Secondary | ICD-10-CM

## 2016-11-29 DIAGNOSIS — N401 Enlarged prostate with lower urinary tract symptoms: Secondary | ICD-10-CM

## 2016-11-29 DIAGNOSIS — C229 Malignant neoplasm of liver, not specified as primary or secondary: Secondary | ICD-10-CM

## 2016-11-29 DIAGNOSIS — N400 Enlarged prostate without lower urinary tract symptoms: Secondary | ICD-10-CM

## 2016-11-29 DIAGNOSIS — E559 Vitamin D deficiency, unspecified: Secondary | ICD-10-CM

## 2016-11-29 DIAGNOSIS — R195 Other fecal abnormalities: Secondary | ICD-10-CM | POA: Diagnosis not present

## 2016-11-29 DIAGNOSIS — E7800 Pure hypercholesterolemia, unspecified: Secondary | ICD-10-CM | POA: Diagnosis not present

## 2016-11-29 DIAGNOSIS — R351 Nocturia: Secondary | ICD-10-CM

## 2016-11-29 LAB — MICROSCOPIC EXAMINATION
BACTERIA UA: NONE SEEN
EPITHELIAL CELLS (NON RENAL): NONE SEEN /HPF (ref 0–10)
RBC MICROSCOPIC, UA: NONE SEEN /HPF (ref 0–?)
Renal Epithel, UA: NONE SEEN /hpf
WBC, UA: NONE SEEN /hpf (ref 0–?)

## 2016-11-29 LAB — URINALYSIS, COMPLETE
Bilirubin, UA: NEGATIVE
GLUCOSE, UA: NEGATIVE
Ketones, UA: NEGATIVE
Leukocytes, UA: NEGATIVE
Nitrite, UA: NEGATIVE
PROTEIN UA: NEGATIVE
RBC, UA: NEGATIVE
Specific Gravity, UA: 1.02 (ref 1.005–1.030)
Urobilinogen, Ur: 0.2 mg/dL (ref 0.2–1.0)
pH, UA: 7 (ref 5.0–7.5)

## 2016-11-29 NOTE — Addendum Note (Signed)
Addended by: Zannie Cove on: 11/29/2016 10:25 AM   Modules accepted: Orders

## 2016-11-29 NOTE — Patient Instructions (Addendum)
Medicare Annual Wellness Visit  [facility] and the medical providers at [facility] strive to bring you the best medical care.  In doing so we not only want to address your current medical conditions and concerns but also to detect new conditions early and prevent illness, disease and health-related problems.    Medicare offers a yearly Wellness Visit which allows our clinical staff to assess your need for preventative services including immunizations, lifestyle education, counseling to decrease risk of preventable diseases and screening for fall risk and other medical concerns.    This visit is provided free of charge (no copay) for all Medicare recipients. The clinical pharmacists at [facility] have begun to conduct these Wellness Visits which will also include a thorough review of all your medications.    As you primary medical provider recommend that you make an appointment for your Annual Wellness Visit if you have not done so already this year.  You may set up this appointment before you leave today or you may call back [phone]) and schedule an appointment.  Please make sure when you call that you mention that you are scheduling your Annual Wellness Visit with the clinical pharmacist so that the appointment may be made for the proper length of time.     Continue current medications. Continue good therapeutic lifestyle changes which include good diet and exercise. Fall precautions discussed with patient. If an FOBT was given today- please return it to our front desk. If you are over 89 years old - you may need Prevnar 22 or the adult Pneumonia vaccine.  **Flu shots are available--- please call and schedule a FLU-CLINIC appointment**  After your visit with [place] today you will receive a survey in the mail or online from [employer] regarding your care with [place]. Please take a moment to fill this out. Your feedback is very  important to [place] as you can help [place] better understand your patient needs as well as improve your experience and satisfaction. WE CARE ABOUT YOU!!!  Continue to follow-up with oncology for hepatocellular carcinoma Watch sodium intake and monitor blood pressures at home periodically

## 2016-11-29 NOTE — Progress Notes (Signed)
Subjective:    Patient ID: Grant Cummings, male    DOB: [birth year], 71 y.o.   MRN: [id number]  HPI Pt here for follow up and management of chronic medical problems which includes hypertension and hyperlipidemia. He is taking medication regularly.  The patient continues to do well.  He is followed regularly at [facility] for his liver cancer.  He will get lab work today and will be given an FOBT to return.  We will also get a urinalysis EKG and do a rectal exam.  He has had a CT scan in June of this year of his chest and he has another one planned for December of this year.  Patient comes to the visit today with his wife.  She has been very supportive of him and all his medical issues.  The last CT scan with contrast of the liver basically just showed scar tissue and was good and he has another one coming up in December as already mentioned.  The patient is up-to-date on his eye exams and gets these yearly.  He denies any chest pain.  He does have some shortness of breath at times but attributes this to his weight and he knows that he needs to do better with losing weight.  1 of the issues that he has is that he does have occasional loose bowel movements with a very active gastrocolic reflex.  This is especially noticeable in the morning if he does not eat when arising but waits till later to eat that when he does eat he has a loose bowel movement.  He denies any heartburn indigestion nausea vomiting diarrhea or blood in the stool.  He is passing his water well and has nocturia x1.     Patient Active Problem List   Diagnosis Date Noted  . Blood type A- 05/08/2015  . Hernia of abdominal wall 05/08/2015  . Edema 11/22/2014  . BPH (benign prostatic hyperplasia) 03/22/2013  . Vitamin D deficiency 03/22/2013  . Hearing deficit 03/22/2013  . Liver cancer ([place])   . Hyperlipidemia   . Hypertension    Outpatient Encounter Prescriptions as of 11/29/2016  Medication Sig    . amLODipine (NORVASC) 10 MG tablet TAKE ONE (1) TABLET EACH DAY  . atorvastatin (LIPITOR) 40 MG tablet TAKE ONE (1) TABLET EACH DAY  . cholecalciferol (VITAMIN D) 1000 UNITS tablet Take 1,000 Units by mouth daily.  . hydrochlorothiazide (HYDRODIURIL) 25 MG tablet TAKE ONE (1) TABLET EACH DAY  . Multiple Vitamin (MULTI-VITAMINS) TABS Take by mouth.  . pantoprazole (PROTONIX) 40 MG tablet TAKE ONE (1) TABLET EACH DAY  . Potassium 99 MG TABS Take 1 tablet (99 mg total) by mouth daily.   No facility-administered encounter medications on file as of 11/29/2016.      Review of Systems  Constitutional: Negative.   HENT: Negative.   Eyes: Negative.   Respiratory: Negative.   Cardiovascular: Negative.   Gastrointestinal: Negative.   Endocrine: Negative.   Genitourinary: Negative.   Musculoskeletal: Negative.   Skin: Negative.   Allergic/Immunologic: Negative.   Neurological: Negative.   Hematological: Negative.   Psychiatric/Behavioral: Negative.        Objective:   Physical Exam  Constitutional: He is oriented to person, place, and time. He appears well-developed and well-nourished. No distress.  Is pleasant and alert and positive  HENT:  Head: Normocephalic and atraumatic.  Right Ear: External ear normal.  Left Ear: External ear normal.  Nose: Nose normal.  Mouth/Throat: Oropharynx is clear and moist. No oropharyngeal exudate.  Eyes: Pupils are equal, round, and reactive to light. Conjunctivae and EOM are normal. Right eye exhibits no discharge. Left eye exhibits no discharge. No scleral icterus.  Neck: Normal range of motion. Neck supple. No thyromegaly present.  No bruits thyromegaly or anterior cervical adenopathy  Cardiovascular: Normal rate, regular rhythm, normal heart sounds and intact distal pulses.   No murmur heard. Heart is regular at 72/min  Pulmonary/Chest: Effort normal and breath sounds normal. No respiratory distress. He has no wheezes. He has no rales. He  exhibits no tenderness.  Clear anteriorly and posteriorly and no axillary adenopathy  Abdominal: Soft. Bowel sounds are normal. He exhibits no mass. There is no tenderness. There is no rebound and no guarding.  Large abdominal scar from liver cancer surgery.  No liver or spleen enlargement palpable no bruits no masses no inguinal adenopathy with good inguinal pulses  Genitourinary: Rectum normal and penis normal.  Genitourinary Comments: Prostate is slightly enlarged without any lumps or masses.  The rectal exam was negative for masses.  The external genitalia were normal with no inguinal hernias being palpated.  Musculoskeletal: Normal range of motion. He exhibits no edema or tenderness.  Lymphadenopathy:    He has no cervical adenopathy.  Neurological: He is alert and oriented to person, place, and time. He has normal reflexes. No cranial nerve deficit.  Skin: Skin is warm and dry. No rash noted.  Psychiatric: He has a normal mood and affect. His behavior is normal. Judgment and thought content normal.  Nursing note and vitals reviewed.  BP (!) 148/82   Pulse 72   Temp (!) 97.2 F (36.2 C) (Oral)   Ht 6' 0.25" (1.835 m)   Wt 226 lb (102.5 kg)   BMI 30.44 kg/m   EKG with results pending==     Assessment & Plan:  1. Vitamin D deficiency -Continue current treatment pending results of lab work - CBC with Differential/Platelet - VITAMIN D 25 Hydroxy (Vit-D Deficiency, Fractures)  2. Essential hypertension -The blood pressure was slightly elevated on the initial reading today but generally speaking home readings are good. - BMP8+EGFR - CBC with Differential/Platelet - Hepatic function panel - EKG 12-Lead  3. Benign prostatic hyperplasia, unspecified whether lower urinary tract symptoms present -The patient does have some nocturia x1.  The prostate exam was negative without lumps or masses. - CBC with Differential/Platelet - Urinalysis, Complete - PSA, total and free  4. Pure  hypercholesterolemia -Continue current treatment pending results of lab work - CBC with Differential/Platelet - Lipid panel - EKG 12-Lead  5. Malignant neoplasm of liver, unspecified liver malignancy type ([place]) -Continue follow-up at [facility] and with oncologist there.  6. Hepatocellular carcinoma ([place]) -Continue follow-up with oncology with planned CT scan of abdomen and chest in December  7. Loose bowel movements -Watch milk cheese ice cream dairy products and caffeine and add back probiotic taking 1 daily  8. Nocturia associated with benign prostatic hyperplasia -This is most likely secondary to his BPH and this is not a worrisome problem at this time.  No orders of the defined types were placed in this encounter.  Patient Instructions                       Medicare Annual Wellness Visit  [facility] and the medical providers at [facility] strive to bring you the best medical care.  In doing so we not only want to address your current medical conditions and concerns but also to detect new conditions early and prevent illness, disease and health-related problems.    Medicare offers a yearly Wellness Visit which allows our clinical staff to assess your need for preventative services including immunizations, lifestyle education, counseling to decrease risk of preventable diseases and screening for fall risk and other medical concerns.    This visit is provided free of charge (no copay) for all Medicare recipients. The clinical pharmacists at [facility] have begun to conduct these Wellness Visits which will also include a thorough review of all your medications.    As you primary medical provider recommend that you make an appointment for your Annual Wellness Visit if you have not done so already this year.  You may set up this appointment before you leave today or you may call back ([phone]) and  schedule an appointment.  Please make sure when you call that you mention that you are scheduling your Annual Wellness Visit with the clinical pharmacist so that the appointment may be made for the proper length of time.     Continue current medications. Continue good therapeutic lifestyle changes which include good diet and exercise. Fall precautions discussed with patient. If an FOBT was given today- please return it to our front desk. If you are over 53 years old - you may need Prevnar 91 or the adult Pneumonia vaccine.  **Flu shots are available--- please call and schedule a FLU-CLINIC appointment**  After your visit with [place] today you will receive a survey in the mail or online from [employer] regarding your care with [place]. Please take a moment to fill this out. Your feedback is very important to [place] as you can help [place] better understand your patient needs as well as improve your experience and satisfaction. WE CARE ABOUT YOU!!!  Continue to follow-up with oncology for hepatocellular carcinoma Watch sodium intake and monitor blood pressures at home periodically   Arrie Senate MD

## 2016-11-30 LAB — LIPID PANEL
CHOLESTEROL TOTAL: 133 mg/dL (ref 100–199)
Chol/HDL Ratio: 3.2 ratio (ref 0.0–5.0)
HDL: 42 mg/dL (ref >39)
LDL Calculated: 68 mg/dL (ref 0–99)
TRIGLYCERIDES: 115 mg/dL (ref 0–149)
VLDL Cholesterol Cal: 23 mg/dL (ref 5–40)

## 2016-11-30 LAB — CBC WITH DIFFERENTIAL/PLATELET
BASOS: 0 %
Basophils Absolute: 0 10*3/uL (ref 0.0–0.2)
EOS (ABSOLUTE): 0.1 10*3/uL (ref 0.0–0.4)
EOS: 1 %
HEMATOCRIT: 39 % (ref 37.5–51.0)
Hemoglobin: 12.9 g/dL — ABNORMAL LOW (ref 13.0–17.7)
IMMATURE GRANS (ABS): 0 10*3/uL (ref 0.0–0.1)
Immature Granulocytes: 0 %
LYMPHS ABS: 0.6 10*3/uL — AB (ref 0.7–3.1)
LYMPHS: 11 %
MCH: 29.5 pg (ref 26.6–33.0)
MCHC: 33.1 g/dL (ref 31.5–35.7)
MCV: 89 fL (ref 79–97)
MONOCYTES: 9 %
Monocytes Absolute: 0.5 10*3/uL (ref 0.1–0.9)
NEUTROS ABS: 4.2 10*3/uL (ref 1.4–7.0)
Neutrophils: 79 %
Platelets: 193 10*3/uL (ref 150–379)
RBC: 4.38 x10E6/uL (ref 4.14–5.80)
RDW: 15.5 % — ABNORMAL HIGH (ref 12.3–15.4)
WBC: 5.2 10*3/uL (ref 3.4–10.8)

## 2016-11-30 LAB — BMP8+EGFR
BUN / CREAT RATIO: 12 (ref 10–24)
BUN: 13 mg/dL (ref 8–27)
CALCIUM: 9.4 mg/dL (ref 8.6–10.2)
CO2: 26 mmol/L (ref 20–29)
CREATININE: 1.1 mg/dL (ref 0.76–1.27)
Chloride: 100 mmol/L (ref 96–106)
GFR, EST AFRICAN AMERICAN: 78 mL/min/{1.73_m2} (ref >59)
GFR, EST NON AFRICAN AMERICAN: 68 mL/min/{1.73_m2} (ref >59)
Glucose: 108 mg/dL — ABNORMAL HIGH (ref 65–99)
Potassium: 3.4 mmol/L — ABNORMAL LOW (ref 3.5–5.2)
Sodium: 141 mmol/L (ref 134–144)

## 2016-11-30 LAB — PSA, TOTAL AND FREE
PSA, Free Pct: 25 %
PSA, Free: 0.05 ng/mL
Prostate Specific Ag, Serum: 0.2 ng/mL (ref 0.0–4.0)

## 2016-11-30 LAB — HEPATIC FUNCTION PANEL
ALK PHOS: 73 IU/L (ref 39–117)
ALT: 28 IU/L (ref 0–44)
AST: 29 IU/L (ref 0–40)
Albumin: 4.5 g/dL (ref 3.5–4.8)
BILIRUBIN TOTAL: 0.5 mg/dL (ref 0.0–1.2)
BILIRUBIN, DIRECT: 0.17 mg/dL (ref 0.00–0.40)
Total Protein: 6.8 g/dL (ref 6.0–8.5)

## 2016-11-30 LAB — VITAMIN D 25 HYDROXY (VIT D DEFICIENCY, FRACTURES): Vit D, 25-Hydroxy: 42.2 ng/mL (ref 30.0–100.0)

## 2016-12-02 ENCOUNTER — Other Ambulatory Visit: Payer: Self-pay | Admitting: *Deleted

## 2016-12-02 DIAGNOSIS — E876 Hypokalemia: Secondary | ICD-10-CM

## 2016-12-02 MED ORDER — POTASSIUM CHLORIDE ER 10 MEQ PO TBCR: 10.0000 meq | EXTENDED_RELEASE_TABLET | Freq: Every day | ORAL | 3 refills | Status: DC

## 2016-12-09 ENCOUNTER — Ambulatory Visit (INDEPENDENT_AMBULATORY_CARE_PROVIDER_SITE_OTHER): Payer: Medicare Other | Admitting: *Deleted

## 2016-12-09 VITALS — BP 130/73 | HR 67 | Temp 96.9°F | Ht 72.25 in | Wt 224.0 lb

## 2016-12-09 DIAGNOSIS — Z0000 Encounter for general adult medical examination without abnormal findings: Secondary | ICD-10-CM

## 2016-12-09 DIAGNOSIS — Z23 Encounter for immunization: Secondary | ICD-10-CM

## 2016-12-09 NOTE — Progress Notes (Signed)
Subjective:   Grant Cummings is a 71 y.o. male who presents for Medicare Annual/Subsequent preventive examination. He is retired for [employer] and he enjoys hunting, fishing, grandchildren and spending time with his wife. 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 He states he chooses healthy food options most of the time, but does enjoy eating out occasionally. He is a active member of [employer] in [place], and he retired from the [employer]. He and his wife live together and they have 3 daughters who all live local. He does not have any pets, but does care for many cows. He is aware of fall hazards in the home and he states that his health is better than it was a year ago.        Objective:    Vitals: BP 130/73   Pulse 67   Temp (!) 96.9 F (36.1 C) (Oral)   Ht 6' 0.25" (1.835 m)   Wt 224 lb (101.6 kg)   BMI 30.17 kg/m   Body mass index is 30.17 kg/m.  Tobacco History  Smoking Status  . Passive Smoke Exposure - Never Smoker  Smokeless Tobacco  . Never Used     Counseling given: Not Answered pt does not smoke and has never smoked.  Past Medical History:  Diagnosis Date  . Asbestos exposure   . Cataract   . Hyperlipidemia   . Hypertension   . Liver cancer [facility])    Past Surgical History:  Procedure Laterality Date  . CARPAL TUNNEL WITH CUBITAL TUNNEL Right   . CHOLECYSTECTOMY    . EYE SURGERY Left 2017   lens implant to correct stigmatism / cataract  . KNEE SURGERY     left  . partial liver removal     x 2   . RADIOFREQUENCY ABLATION LIVER TUMOR     with biopsy  - NEG   Family History  Problem Relation Age of Onset  . Alzheimer's disease Mother   . Cancer Mother 59       breast  . Alzheimer's disease Father   . Cancer Father        lymph nodes- jaw  . Hypertension Father   . Heart disease Father        heart attack   . Cancer Sister        ovarian  . Hypertension Sister   . GI problems Sister   . Clotting disorder Paternal Grandfather     History  Sexual Activity  . Sexual activity: Yes    Outpatient Encounter Prescriptions as of 12/09/2016  Medication Sig  . amLODipine (NORVASC) 10 MG tablet TAKE ONE (1) TABLET EACH DAY  . atorvastatin (LIPITOR) 40 MG tablet TAKE ONE (1) TABLET EACH DAY  . cholecalciferol (VITAMIN D) 1000 UNITS tablet Take 1,000 Units by mouth daily.  . hydrochlorothiazide (HYDRODIURIL) 25 MG tablet TAKE ONE (1) TABLET EACH DAY  . Multiple Vitamin (MULTI-VITAMINS) TABS Take by mouth.  . pantoprazole (PROTONIX) 40 MG tablet TAKE ONE (1) TABLET EACH DAY  . potassium chloride (K-DUR) 10 MEQ tablet Take 1 tablet (10 mEq total) by mouth daily.  . [DISCONTINUED] Potassium 99 MG TABS Take 1 tablet (99 mg total) by mouth daily.   No facility-administered encounter medications on file as of 12/09/2016.     Activities of Daily Living In your present state of health, do you have any difficulty performing the following activities: 12/09/2016  Hearing? Y  Comment wears hearing aid / audiology visits /  leaf in left ear  Vision? Y  Comment wears glasses regularly  Difficulty concentrating or making decisions? N  Walking or climbing stairs? N  Dressing or bathing? N  Doing errands, shopping? 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  Patient Care Team: Chipper Herb, MD as PCP - General (Family Medicine) Pearlie Oyster Estill Bamberg, MD as Consulting Physician (Specialist) Felizardo Hoffmann, MD as Referring Physician (Hematology and Oncology) Particia Nearing, [place] (Optometry) Monna Fam, MD as Consulting Physician (Ophthalmology) Olegario Messier, MD as Referring Physician (Internal Medicine) Jyl Heinz, MD as Referring Physician (Surgery)   Assessment:    Exercise Activities and Dietary recommendations    Goals    . Reduce sodium intake    . Reduce sugar intake to X grams per day    . Weight (lb) < 215 lb (97.5 kg)      Fall  Risk Fall Risk  12/09/2016 12/09/2016 11/29/2016 05/30/2016 12/01/2015  Falls in the past year? No No No Yes No  Number falls in past yr: - - - 1 -  Injury with Fall? - - - Yes -   Depression Screen PHQ 2/9 Scores 12/09/2016 12/09/2016 11/29/2016 05/30/2016  PHQ - 2 Score 0 0 0 0    Cognitive Function MMSE - Mini Mental State Exam 12/09/2016 05/08/2015  Orientation to time 5 5  Orientation to Place 5 5  Registration 3 3  Attention/ Calculation 4 4  Recall 3 3  Language- name 2 objects 2 2  Language- repeat 1 1  Language- follow 3 step command 3 3  Language- read & follow direction 1 1  Write a sentence 1 1  Copy design 1 0  Total score 29 28        Immunization History  Administered Date(s) Administered  . Influenza, High Dose Seasonal PF 12/01/2015  . Influenza,inj,Quad PF,6+ Mos 12/21/2013, 11/22/2014  . Pneumococcal Conjugate-13 11/22/2014  . Pneumococcal-Unspecified 12/06/2012  . Tdap 11/29/2016   Screening Tests Health Maintenance  Topic Date Due  . INFLUENZA VACCINE  12/13/2016 (Originally 09/11/2016)  . COLON CANCER SCREENING ANNUAL FOBT  12/13/2016 (Originally 05/12/2016)  . Hepatitis C Screening  11/30/2021 (Originally 08/29/1945)  . TETANUS/TDAP  11/30/2026  . PNA vac Low Risk Adult  Completed      Plan:    He will Bring in a copy a your advanced directives He will Work on your goals over the next year. He will Keep follow up with Dr Laurance Flatten and other specialist     I have personally reviewed and noted the following in the patient's chart:   . Medical and social history . Use of alcohol, tobacco or illicit drugs  . Current medications and supplements . Functional ability and status . Nutritional status . Physical activity . Advanced directives . List of other physicians . Hospitalizations, surgeries, and ER visits in previous 12 months . Vitals . Screenings to include cognitive, depression, and falls . Referrals and appointments  In addition, I  have reviewed and discussed with patient certain preventive protocols, quality metrics, and best practice recommendations. A written personalized care plan for preventive services as well as general preventive health recommendations were provided to patient.     Bullins, Cameron Proud, LPN  10/62/6948   I have reviewed and agree with the above AWV documentation.   Mary-Margaret Hassell Done, FNP

## 2016-12-09 NOTE — Patient Instructions (Addendum)
  [name] , Thank you for taking time to come for your Medicare Wellness Visit. I appreciate your ongoing commitment to your health goals. Please review the following plan we discussed and let me know if I can assist you in the future.   These are the goals we discussed: Goals    . Reduce sodium intake    . Reduce sugar intake to X grams per day    . Weight (lb) < 215 lb (97.5 kg)       This is a list of the screening recommended for you and due dates:  Health Maintenance  Topic Date Due  . Flu Shot  12/13/2016*  . Stool Blood Test  12/13/2016*  .  Hepatitis C: One time screening is recommended by Center for Disease Control  (CDC) for  adults born from [birth year] through 1964.   11/30/2021*  . Tetanus Vaccine  11/30/2026  . Pneumonia vaccines  Completed  *Topic was postponed. The date shown is not the original due date.   Bring in a copy a your advanced directives Work on your goals over the next year. Keep follow up with Dr Laurance Flatten and other specialist

## 2017-01-01 ENCOUNTER — Other Ambulatory Visit: Payer: Medicare Other

## 2017-01-01 DIAGNOSIS — E876 Hypokalemia: Secondary | ICD-10-CM

## 2017-01-02 LAB — BMP8+EGFR
BUN/Creatinine Ratio: 19 (ref 10–24)
BUN: 20 mg/dL (ref 8–27)
CO2: 24 mmol/L (ref 20–29)
CREATININE: 1.08 mg/dL (ref 0.76–1.27)
Calcium: 9.3 mg/dL (ref 8.6–10.2)
Chloride: 99 mmol/L (ref 96–106)
GFR calc Af Amer: 80 mL/min/{1.73_m2} (ref >59)
GFR calc non Af Amer: 69 mL/min/{1.73_m2} (ref >59)
GLUCOSE: 158 mg/dL — AB (ref 65–99)
Potassium: 3.7 mmol/L (ref 3.5–5.2)
SODIUM: 140 mmol/L (ref 134–144)

## 2017-02-21 ENCOUNTER — Encounter: Payer: Self-pay | Admitting: Nurse Practitioner

## 2017-02-21 ENCOUNTER — Ambulatory Visit (INDEPENDENT_AMBULATORY_CARE_PROVIDER_SITE_OTHER): Payer: PPO | Admitting: Nurse Practitioner

## 2017-02-21 ENCOUNTER — Ambulatory Visit (INDEPENDENT_AMBULATORY_CARE_PROVIDER_SITE_OTHER): Payer: PPO

## 2017-02-21 VITALS — BP 144/84 | HR 80 | Temp 97.1°F | Ht 72.0 in | Wt 224.0 lb

## 2017-02-21 DIAGNOSIS — M7989 Other specified soft tissue disorders: Secondary | ICD-10-CM | POA: Diagnosis not present

## 2017-02-21 DIAGNOSIS — M79644 Pain in right finger(s): Secondary | ICD-10-CM | POA: Diagnosis not present

## 2017-02-21 DIAGNOSIS — S4991XA Unspecified injury of right shoulder and upper arm, initial encounter: Secondary | ICD-10-CM | POA: Diagnosis not present

## 2017-02-21 DIAGNOSIS — S62630A Displaced fracture of distal phalanx of right index finger, initial encounter for closed fracture: Secondary | ICD-10-CM | POA: Diagnosis not present

## 2017-02-21 NOTE — Progress Notes (Signed)
   Subjective:    Patient ID: Grant Cummings, male    DOB: December 07, 1945, 73 y.o.   MRN: [id number]  HPI Patient come s in c/o right index finger injury. He mashed it in a wood splitter Wednesday. Bruised and painful to move.    Review of Systems  Constitutional: Negative.   Eyes: Negative.   Respiratory: Negative.   Gastrointestinal: Negative.   Musculoskeletal: Positive for arthralgias (right index finger).  Neurological: Negative.   Psychiatric/Behavioral: Negative.   All other systems reviewed and are negative.      Objective:   Physical Exam  Constitutional: He is oriented to person, place, and time. He appears well-developed and well-nourished. No distress.  Cardiovascular: Normal rate and regular rhythm.  Pulmonary/Chest: Effort normal and breath sounds normal.  Musculoskeletal:  Right index firnger contusion and swelling of distal phalanax  Neurological: He is alert and oriented to person, place, and time.  Skin: Skin is warm.  Psychiatric: He has a normal mood and affect. His behavior is normal. Judgment and thought content normal.   BP (!) 144/84   Pulse 80   Temp (!) 97.1 F (36.2 C) (Oral)   Ht 6' (1.829 m)   Wt 224 lb (101.6 kg)   BMI 30.38 kg/m   Right index finger- mildly displaced fracture tip of distal phalanx of right index finger-Preliminary reading by Ronnald Collum, FNP  [facility]       Assessment & Plan:  1. Finger swelling - DG Finger Index Right; Future  2. Closed displaced fracture of distal phalanx of right index finger, initial encounter Tylenol OTC Avoid hitting finger Soak in epsom salt  Mary-Margaret Hassell Done, FNP

## 2017-02-28 ENCOUNTER — Telehealth: Payer: Self-pay | Admitting: Nurse Practitioner

## 2017-02-28 MED ORDER — SULFAMETHOXAZOLE-TRIMETHOPRIM 800-160 MG PO TABS: 1.0000 | ORAL_TABLET | Freq: Two times a day (BID) | ORAL | 0 refills | Status: DC

## 2017-02-28 NOTE — Telephone Encounter (Signed)
Please advise on medication or treatment for finger wound.

## 2017-02-28 NOTE — Telephone Encounter (Signed)
Bactrim rx sent to pharmacy 

## 2017-03-03 ENCOUNTER — Encounter: Payer: Self-pay | Admitting: Family Medicine

## 2017-03-03 ENCOUNTER — Ambulatory Visit (INDEPENDENT_AMBULATORY_CARE_PROVIDER_SITE_OTHER): Payer: PPO | Admitting: Family Medicine

## 2017-03-03 ENCOUNTER — Ambulatory Visit (INDEPENDENT_AMBULATORY_CARE_PROVIDER_SITE_OTHER): Payer: PPO

## 2017-03-03 VITALS — BP 130/88 | HR 77 | Temp 98.3°F | Ht 72.0 in | Wt 224.0 lb

## 2017-03-03 DIAGNOSIS — M79644 Pain in right finger(s): Secondary | ICD-10-CM

## 2017-03-03 DIAGNOSIS — L03011 Cellulitis of right finger: Secondary | ICD-10-CM | POA: Diagnosis not present

## 2017-03-03 DIAGNOSIS — M7989 Other specified soft tissue disorders: Secondary | ICD-10-CM | POA: Diagnosis not present

## 2017-03-03 DIAGNOSIS — S6991XA Unspecified injury of right wrist, hand and finger(s), initial encounter: Secondary | ICD-10-CM | POA: Diagnosis not present

## 2017-03-03 NOTE — Progress Notes (Signed)
BP 130/88   Pulse 77   Temp 98.3 F (36.8 C) (Oral)   Ht 6' (1.829 m)   Wt 224 lb (101.6 kg)   BMI 30.38 kg/m    Subjective:    Patient ID: Grant Cummings, male    DOB: 1946-01-19, 72 y.o.   MRN: [id number]  HPI: Grant Cummings is a 72 y.o. male presenting on 03/03/2017 for Redness and swelling in index finger right hand (mashed finger in wood splitter, fractured tip of finger, taking Bactrim now - has taken 5 pills)   HPI Right finger pain and swelling Patient has been having finger pain and swelling in his right index finger.  He came in on 02/21/1998 for this after he had an accident where he smashed his finger between a piece of wood and a piece of metal on a wood chipper.  He said he was seen and had an x-ray and thought there was a fracture in the tip of his finger and he placed himself in a splint but newly over the past few days he has developed swelling and pain on either side of his right index finger just distal to the MCP joint.  He is developed some blistering on the lateral aspect of his right index finger.  Relevant past medical, surgical, family and social history reviewed and updated as indicated. Interim medical history since our last visit reviewed. Allergies and medications reviewed and updated.  Review of Systems  Constitutional: Negative for chills and fever.  Respiratory: Negative for shortness of breath and wheezing.   Cardiovascular: Negative for chest pain and leg swelling.  Musculoskeletal: Positive for arthralgias and joint swelling. Negative for back pain and gait problem.  Skin: Positive for color change. Negative for rash.  All other systems reviewed and are negative.   Per HPI unless specifically indicated above        Objective:    BP 130/88   Pulse 77   Temp 98.3 F (36.8 C) (Oral)   Ht 6' (1.829 m)   Wt 224 lb (101.6 kg)   BMI 30.38 kg/m   Wt Readings from Last 3 Encounters:  03/03/17 224 lb (101.6 kg)  02/21/17 224 lb (101.6 kg)    12/09/16 224 lb (101.6 kg)    Physical Exam  Constitutional: He is oriented to person, place, and time. He appears well-developed and well-nourished. No distress.  Eyes: Conjunctivae are normal.  Musculoskeletal: Normal range of motion. He exhibits no edema.  Neurological: He is alert and oriented to person, place, and time. Coordination normal.  Skin: Skin is warm and dry. No rash noted. He is not diaphoretic. There is erythema (Patient has erythema in a bandlike area over his proximal phalanx of the right index finger with blistering on the lateral aspect.).  Psychiatric: He has a normal mood and affect. His behavior is normal.  Nursing note and vitals reviewed.  Attempted to incise and drain 1 of the blisters but sanguinous fluid was all that was found.  Nothing to culture.  Right index finger x-ray: No acute bony abnormality noted, already read by radiology.     Assessment & Plan:   Problem List Items Addressed This Visit    None    Visit Diagnoses    Cellulitis of right index finger    -  Primary   Pain in finger of right hand       Relevant Orders   DG Finger Index Right (Completed)    Continue Bactrim and come  back in 1 week and will observe for improvement.  Follow up plan: Return in about 1 week (around 03/10/2017), or if symptoms worsen or fail to improve, for Right finger pain and swelling follow-up.  Counseling provided for all of the vaccine components Orders Placed This Encounter  Procedures  . DG Finger Index Right    Caryl Pina, MD [facility] 03/03/2017, 4:40 PM

## 2017-03-05 ENCOUNTER — Other Ambulatory Visit: Payer: Self-pay | Admitting: Family Medicine

## 2017-03-10 ENCOUNTER — Ambulatory Visit (INDEPENDENT_AMBULATORY_CARE_PROVIDER_SITE_OTHER): Payer: PPO | Admitting: Family Medicine

## 2017-03-10 ENCOUNTER — Encounter: Payer: Self-pay | Admitting: Family Medicine

## 2017-03-10 VITALS — BP 139/84 | HR 77 | Temp 98.0°F | Ht 72.0 in | Wt 226.0 lb

## 2017-03-10 DIAGNOSIS — L03011 Cellulitis of right finger: Secondary | ICD-10-CM | POA: Diagnosis not present

## 2017-03-10 MED ORDER — SULFAMETHOXAZOLE-TRIMETHOPRIM 800-160 MG PO TABS: 1.0000 | ORAL_TABLET | Freq: Two times a day (BID) | ORAL | 0 refills | Status: DC

## 2017-03-10 NOTE — Progress Notes (Signed)
BP 139/84   Pulse 77   Temp 98 F (36.7 C) (Oral)   Ht 6' (1.829 m)   Wt 226 lb (102.5 kg)   BMI 30.65 kg/m    Subjective:    Patient ID: Grant Cummings, male    DOB: November 14, 1945, 72 y.o.   MRN: [id number]  HPI: Grant Cummings is a 72 y.o. male presenting on 03/10/2017 for Followup cellulitis right index finger (finishes antibiotic tonight)   HPI Recheck finger cellulitis Patient is coming in with swelling of his finger and pain and peeling.  The redness and warmth and swelling has improved, his finger has started peeling most of the skin over the distal 2 phalanxes.  He denies any fevers or chills.  He has been taking the Bactrim and just finished the last dose of it this morning.  He denies any loss of range of motion but does have some pain overlying the DIP joint and that is where there is still little bit of redness overlying the DIP joint in that index finger of his right hand.  Relevant past medical, surgical, family and social history reviewed and updated as indicated. Interim medical history since our last visit reviewed. Allergies and medications reviewed and updated.  Review of Systems  Constitutional: Negative for chills and fever.  Respiratory: Negative for shortness of breath and wheezing.   Cardiovascular: Negative for chest pain and leg swelling.  Musculoskeletal: Positive for arthralgias. Negative for back pain and gait problem.  Skin: Positive for color change. Negative for rash.  All other systems reviewed and are negative.   Per HPI unless specifically indicated above   Allergies as of 03/10/2017   No Known Allergies     Medication List        Accurate as of 03/10/17  8:59 AM. Always use your most recent med list.          amLODipine 10 MG tablet Commonly known as:  NORVASC TAKE ONE (1) TABLET EACH DAY   atorvastatin 40 MG tablet Commonly known as:  LIPITOR TAKE ONE (1) TABLET EACH DAY   cholecalciferol 1000 units tablet Commonly known as:   VITAMIN D Take 1,000 Units by mouth daily.   hydrochlorothiazide 25 MG tablet Commonly known as:  HYDRODIURIL TAKE ONE (1) TABLET EACH DAY   pantoprazole 40 MG tablet Commonly known as:  PROTONIX TAKE ONE (1) TABLET EACH DAY   potassium chloride 10 MEQ tablet Commonly known as:  K-DUR Take 1 tablet (10 mEq total) by mouth daily.   sulfamethoxazole-trimethoprim 800-160 MG tablet Commonly known as:  BACTRIM DS,SEPTRA DS Take 1 tablet by mouth 2 (two) times daily.          Objective:    BP 139/84   Pulse 77   Temp 98 F (36.7 C) (Oral)   Ht 6' (1.829 m)   Wt 226 lb (102.5 kg)   BMI 30.65 kg/m   Wt Readings from Last 3 Encounters:  03/10/17 226 lb (102.5 kg)  03/03/17 224 lb (101.6 kg)  02/21/17 224 lb (101.6 kg)    Physical Exam  Constitutional: He is oriented to person, place, and time. He appears well-developed and well-nourished. No distress.  Eyes: Conjunctivae are normal. No scleral icterus.  Musculoskeletal: Normal range of motion. He exhibits no edema.       Right hand: He exhibits tenderness (Tenderness over the dorsal aspect of the DIP joint, range of motion intact, slight erythema overlying the DIP joint) and swelling. He exhibits normal  range of motion, normal two-point discrimination and normal capillary refill.  Neurological: He is alert and oriented to person, place, and time. Coordination normal.  Skin: Skin is warm and dry. No rash noted. He is not diaphoretic. There is erythema (Slight erythema overlying DIP joint of right index finger, peeling of the skin overlying the distal to phalanxes).  Psychiatric: He has a normal mood and affect. His behavior is normal.  Nursing note and vitals reviewed.  Results for orders placed or performed in visit on 01/01/17  [facility]  Result Value Ref Range   Glucose 158 (H) 65 - 99 mg/dL   BUN 20 8 - 27 mg/dL   Creatinine, Ser 1.08 0.76 - 1.27 mg/dL   GFR calc non Af Amer 69 >59 mL/min/1.73   GFR calc Af Amer 80  >59 mL/min/1.73   BUN/Creatinine Ratio 19 10 - 24   Sodium 140 134 - 144 mmol/L   Potassium 3.7 3.5 - 5.2 mmol/L   Chloride 99 96 - 106 mmol/L   CO2 24 20 - 29 mmol/L   Calcium 9.3 8.6 - 10.2 mg/dL      Assessment & Plan:   Problem List Items Addressed This Visit    None    Visit Diagnoses    Cellulitis of right index finger    -  Primary   Relevant Medications   sulfamethoxazole-trimethoprim (BACTRIM DS,SEPTRA DS) 800-160 MG tablet      Patient has peeling of the skin over his index finger with still some swelling and redness over the DIP joint of that finger.  Concerned there may be a small amount of infection in the joint itself, will do a longer course of the antibiotics by giving him 1 more weeks worth and if it still not improved by next week then we will send him to an orthopedic physician.  Follow up plan: Return if symptoms worsen or fail to improve.  Counseling provided for all of the vaccine components No orders of the defined types were placed in this encounter.   Caryl Pina, MD [facility] 03/10/2017, 8:59 AM

## 2017-03-17 ENCOUNTER — Other Ambulatory Visit: Payer: Self-pay | Admitting: Family Medicine

## 2017-03-17 DIAGNOSIS — I10 Essential (primary) hypertension: Secondary | ICD-10-CM

## 2017-03-20 DIAGNOSIS — L570 Actinic keratosis: Secondary | ICD-10-CM | POA: Diagnosis not present

## 2017-05-26 ENCOUNTER — Other Ambulatory Visit: Payer: Self-pay | Admitting: *Deleted

## 2017-05-26 ENCOUNTER — Telehealth: Payer: Self-pay | Admitting: Family Medicine

## 2017-05-26 DIAGNOSIS — E559 Vitamin D deficiency, unspecified: Secondary | ICD-10-CM

## 2017-05-26 DIAGNOSIS — I10 Essential (primary) hypertension: Secondary | ICD-10-CM

## 2017-05-26 DIAGNOSIS — N400 Enlarged prostate without lower urinary tract symptoms: Secondary | ICD-10-CM

## 2017-05-26 DIAGNOSIS — E7800 Pure hypercholesterolemia, unspecified: Secondary | ICD-10-CM

## 2017-05-26 NOTE — Telephone Encounter (Signed)
Aware labs placed

## 2017-05-29 ENCOUNTER — Other Ambulatory Visit: Payer: Self-pay | Admitting: Family Medicine

## 2017-06-02 ENCOUNTER — Ambulatory Visit: Payer: Medicare Other | Admitting: Family Medicine

## 2017-06-10 ENCOUNTER — Other Ambulatory Visit: Payer: PPO

## 2017-06-10 DIAGNOSIS — E7800 Pure hypercholesterolemia, unspecified: Secondary | ICD-10-CM

## 2017-06-10 DIAGNOSIS — N400 Enlarged prostate without lower urinary tract symptoms: Secondary | ICD-10-CM | POA: Diagnosis not present

## 2017-06-10 DIAGNOSIS — I10 Essential (primary) hypertension: Secondary | ICD-10-CM | POA: Diagnosis not present

## 2017-06-10 DIAGNOSIS — E559 Vitamin D deficiency, unspecified: Secondary | ICD-10-CM

## 2017-06-11 LAB — CBC WITH DIFFERENTIAL/PLATELET
BASOS ABS: 0 10*3/uL (ref 0.0–0.2)
BASOS: 0 %
EOS (ABSOLUTE): 0 10*3/uL (ref 0.0–0.4)
Eos: 1 %
HEMOGLOBIN: 13.3 g/dL (ref 13.0–17.7)
Hematocrit: 40.4 % (ref 37.5–51.0)
Immature Grans (Abs): 0 10*3/uL (ref 0.0–0.1)
Immature Granulocytes: 0 %
LYMPHS ABS: 0.8 10*3/uL (ref 0.7–3.1)
Lymphs: 13 %
MCH: 29 pg (ref 26.6–33.0)
MCHC: 32.9 g/dL (ref 31.5–35.7)
MCV: 88 fL (ref 79–97)
MONOCYTES: 9 %
Monocytes Absolute: 0.5 10*3/uL (ref 0.1–0.9)
NEUTROS ABS: 4.4 10*3/uL (ref 1.4–7.0)
Neutrophils: 77 %
Platelets: 197 10*3/uL (ref 150–379)
RBC: 4.59 x10E6/uL (ref 4.14–5.80)
RDW: 15.2 % (ref 12.3–15.4)
WBC: 5.7 10*3/uL (ref 3.4–10.8)

## 2017-06-11 LAB — BMP8+EGFR
BUN / CREAT RATIO: 19 (ref 10–24)
BUN: 21 mg/dL (ref 8–27)
CO2: 24 mmol/L (ref 20–29)
CREATININE: 1.11 mg/dL (ref 0.76–1.27)
Calcium: 9.4 mg/dL (ref 8.6–10.2)
Chloride: 99 mmol/L (ref 96–106)
GFR, EST AFRICAN AMERICAN: 77 mL/min/{1.73_m2} (ref >59)
GFR, EST NON AFRICAN AMERICAN: 66 mL/min/{1.73_m2} (ref >59)
GLUCOSE: 105 mg/dL — AB (ref 65–99)
Potassium: 3.9 mmol/L (ref 3.5–5.2)
Sodium: 140 mmol/L (ref 134–144)

## 2017-06-11 LAB — LIPID PANEL
CHOL/HDL RATIO: 3.5 ratio (ref 0.0–5.0)
Cholesterol, Total: 150 mg/dL (ref 100–199)
HDL: 43 mg/dL (ref >39)
LDL CALC: 84 mg/dL (ref 0–99)
Triglycerides: 114 mg/dL (ref 0–149)
VLDL Cholesterol Cal: 23 mg/dL (ref 5–40)

## 2017-06-11 LAB — HEPATIC FUNCTION PANEL
ALBUMIN: 4.3 g/dL (ref 3.5–4.8)
ALT: 27 IU/L (ref 0–44)
AST: 25 IU/L (ref 0–40)
Alkaline Phosphatase: 69 IU/L (ref 39–117)
BILIRUBIN TOTAL: 0.6 mg/dL (ref 0.0–1.2)
Bilirubin, Direct: 0.15 mg/dL (ref 0.00–0.40)
Total Protein: 6.4 g/dL (ref 6.0–8.5)

## 2017-06-11 LAB — VITAMIN D 25 HYDROXY (VIT D DEFICIENCY, FRACTURES): Vit D, 25-Hydroxy: 44.6 ng/mL (ref 30.0–100.0)

## 2017-06-16 ENCOUNTER — Other Ambulatory Visit: Payer: Self-pay | Admitting: Family Medicine

## 2017-06-16 DIAGNOSIS — I10 Essential (primary) hypertension: Secondary | ICD-10-CM

## 2017-06-17 ENCOUNTER — Encounter: Payer: Self-pay | Admitting: Family Medicine

## 2017-06-17 ENCOUNTER — Ambulatory Visit (INDEPENDENT_AMBULATORY_CARE_PROVIDER_SITE_OTHER): Payer: PPO | Admitting: Family Medicine

## 2017-06-17 VITALS — BP 133/79 | HR 77 | Temp 97.3°F | Ht 72.0 in | Wt 227.0 lb

## 2017-06-17 DIAGNOSIS — I10 Essential (primary) hypertension: Secondary | ICD-10-CM

## 2017-06-17 DIAGNOSIS — N644 Mastodynia: Secondary | ICD-10-CM | POA: Diagnosis not present

## 2017-06-17 DIAGNOSIS — E785 Hyperlipidemia, unspecified: Secondary | ICD-10-CM | POA: Diagnosis not present

## 2017-06-17 DIAGNOSIS — E7800 Pure hypercholesterolemia, unspecified: Secondary | ICD-10-CM | POA: Diagnosis not present

## 2017-06-17 DIAGNOSIS — E559 Vitamin D deficiency, unspecified: Secondary | ICD-10-CM

## 2017-06-17 DIAGNOSIS — C220 Liver cell carcinoma: Secondary | ICD-10-CM

## 2017-06-17 DIAGNOSIS — C229 Malignant neoplasm of liver, not specified as primary or secondary: Secondary | ICD-10-CM

## 2017-06-17 DIAGNOSIS — M25562 Pain in left knee: Secondary | ICD-10-CM | POA: Diagnosis not present

## 2017-06-17 DIAGNOSIS — G8929 Other chronic pain: Secondary | ICD-10-CM

## 2017-06-17 DIAGNOSIS — R0789 Other chest pain: Secondary | ICD-10-CM | POA: Diagnosis not present

## 2017-06-17 DIAGNOSIS — N400 Enlarged prostate without lower urinary tract symptoms: Secondary | ICD-10-CM

## 2017-06-17 DIAGNOSIS — E876 Hypokalemia: Secondary | ICD-10-CM

## 2017-06-17 MED ORDER — ATORVASTATIN CALCIUM 40 MG PO TABS: ORAL_TABLET | ORAL | 3 refills | Status: DC

## 2017-06-17 MED ORDER — HYDROCHLOROTHIAZIDE 25 MG PO TABS: ORAL_TABLET | ORAL | 3 refills | Status: DC

## 2017-06-17 MED ORDER — POTASSIUM CHLORIDE ER 10 MEQ PO TBCR: EXTENDED_RELEASE_TABLET | ORAL | 3 refills | Status: DC

## 2017-06-17 MED ORDER — PANTOPRAZOLE SODIUM 40 MG PO TBEC: DELAYED_RELEASE_TABLET | ORAL | 3 refills | Status: DC

## 2017-06-17 MED ORDER — AMLODIPINE BESYLATE 10 MG PO TABS: ORAL_TABLET | ORAL | 3 refills | Status: DC

## 2017-06-17 NOTE — Patient Instructions (Addendum)
Medicare Annual Wellness Visit  [facility] and the medical providers at [facility] strive to bring you the best medical care.  In doing so we not only want to address your current medical conditions and concerns but also to detect new conditions early and prevent illness, disease and health-related problems.    Medicare offers a yearly Wellness Visit which allows our clinical staff to assess your need for preventative services including immunizations, lifestyle education, counseling to decrease risk of preventable diseases and screening for fall risk and other medical concerns.    This visit is provided free of charge (no copay) for all Medicare recipients. The clinical pharmacists at [facility] have begun to conduct these Wellness Visits which will also include a thorough review of all your medications.    As you primary medical provider recommend that you make an appointment for your Annual Wellness Visit if you have not done so already this year.  You may set up this appointment before you leave today or you may call back ([phone]) and schedule an appointment.  Please make sure when you call that you mention that you are scheduling your Annual Wellness Visit with the clinical pharmacist so that the appointment may be made for the proper length of time.     Continue current medications. Continue good therapeutic lifestyle changes which include good diet and exercise. Fall precautions discussed with patient. If an FOBT was given today- please return it to our front desk. If you are over 44 years old - you may need Prevnar 102 or the adult Pneumonia vaccine.  **Flu shots are available--- please call and schedule a FLU-CLINIC appointment**  After your visit with [place] today you will receive a survey in the mail or online from [employer] regarding your care with [place]. Please take a moment to fill this out. Your feedback is very  important to [place] as you can help [place] better understand your patient needs as well as improve your experience and satisfaction. WE CARE ABOUT YOU!!!   We will arrange to get a mammogram and possibly an ultrasound of the breast tissue where your soreness is noted. Follow-up with oncologist as planned in June We will also arrange for you to see the orthopedic surgeon that comes to this office for your ongoing left knee pain and he may need to do another injection Take a copy of the blood work that you have had done here with you to your visit to [facility].

## 2017-06-17 NOTE — Progress Notes (Signed)
Subjective:    Patient ID: Grant Cummings, male    DOB: Sep 13, 1945, 72 y.o.   MRN: [id number]  HPI Pt here for follow up and management of chronic medical problems which includes hypertension and hyperlipidemia. He is taking medication regularly.  The patient is doing well overall.  He does complain of a sore in his right chest area and some left knee pain.  He is requesting refills on all of his medicines.  He will be given an FOBT to return.  He had a CT scan of his chest at [facility] in December 2018 and everything appeared to be stable with the liver at that time with only scar tissue type changes.  He has had lab work done and we will review this with him during the visit today.  The blood sugar is slightly elevated at 105.  The creatinine, the most important kidney function test was normal and all of the electrolytes including potassium were normal.  All liver function tests were normal.  The vitamin D level was good at 44.6.  All cholesterol numbers were good and at goal with an LDL C being 84 though slightly increased from previously when it was 68 but still good and at goal.  The HDL was good and triglycerides are good at 114.  The CBC had a normal white blood cell count.  Hemoglobin was improved and good at 13.3 and the platelet count was adequate.  Patient complains of ongoing problems with the left knee.  Several years ago he did have an injection in the knee.  He says it pops and cracks.  For the past 3 months he has noticed some soreness in the right chest wall around the right nipple and areola area.  He has not felt any lumps or masses.  Today he denies any chest pain or shortness of breath any more than usual.  He swallowing not having any heartburn indigestion nausea vomiting diarrhea blood in the stool or black tarry bowel movements.  There was some discussion about his ongoing need for Protonix and he we will try between now and his visit back to his [facility] physicians of reducing  his Protonix and taking ranitidine 150 mg twice daily on Monday Wednesday and Friday and the Protonix on all the other days.  He is passing his water without problems and has to get up to go the bathroom usually once nightly.  Referring back to his taking the proton pump inhibitor he is never had reflux symptoms.  He did have some bleeding in the past but he says this was secondary to radiation treatments and not to any problems with ulcers.  The patient comes to the visit today with his wife.  She is very supportive of him and I have a very happy marriage.     Patient Active Problem List   Diagnosis Date Noted  . Blood type A- 05/08/2015  . Hernia of abdominal wall 05/08/2015  . Edema 11/22/2014  . BPH (benign prostatic hyperplasia) 03/22/2013  . Vitamin D deficiency 03/22/2013  . Hearing deficit 03/22/2013  . Liver cancer ([place])   . Hyperlipidemia   . Hypertension    Outpatient Encounter Medications as of 06/17/2017  Medication Sig  . amLODipine (NORVASC) 10 MG tablet TAKE ONE (1) TABLET EACH DAY  . atorvastatin (LIPITOR) 40 MG tablet TAKE ONE (1) TABLET EACH DAY  . cholecalciferol (VITAMIN D) 1000 UNITS tablet Take 1,000 Units by mouth daily.  . hydrochlorothiazide (HYDRODIURIL) 25  MG tablet TAKE ONE (1) TABLET EACH DAY  . pantoprazole (PROTONIX) 40 MG tablet TAKE ONE (1) TABLET EACH DAY  . potassium chloride (K-DUR) 10 MEQ tablet TAKE ONE (1) TABLET EACH DAY  . [DISCONTINUED] sulfamethoxazole-trimethoprim (BACTRIM DS,SEPTRA DS) 800-160 MG tablet Take 1 tablet by mouth 2 (two) times daily.   No facility-administered encounter medications on file as of 06/17/2017.      Review of Systems  Constitutional: Negative.   HENT: Negative.   Eyes: Negative.   Respiratory: Negative.   Cardiovascular: Negative.   Gastrointestinal: Negative.   Endocrine: Negative.   Genitourinary: Negative.   Musculoskeletal: Positive for arthralgias (left knee pain ).  Skin: Negative.        Sore skin area  right chest   Allergic/Immunologic: Negative.   Neurological: Negative.   Hematological: Negative.   Psychiatric/Behavioral: Negative.        Objective:   Physical Exam  Constitutional: He is oriented to person, place, and time. He appears well-developed and well-nourished. No distress.  The patient is pleasant alert smiling and appears to be feeling well.  HENT:  Head: Normocephalic and atraumatic.  Right Ear: External ear normal.  Left Ear: External ear normal.  Mouth/Throat: Oropharynx is clear and moist. No oropharyngeal exudate.  Nasal turbinate congestion bilaterally right greater than left  Eyes: Pupils are equal, round, and reactive to light. Conjunctivae and EOM are normal. Right eye exhibits no discharge. Left eye exhibits no discharge.  Neck: Normal range of motion. Neck supple. No thyromegaly present.  No bruits thyromegaly or anterior cervical adenopathy  Cardiovascular: Normal rate, regular rhythm, normal heart sounds and intact distal pulses.  No murmur heard. Heart has a regular rate and rhythm at 72/min  Pulmonary/Chest: Effort normal and breath sounds normal. No respiratory distress. He has no wheezes. He has no rales. He exhibits no tenderness.  Clear anteriorly and posteriorly without axillary adenopathy or chest wall masses.  The right breast in particular was examined closely and there were no lumps or masses or axillary adenopathy noted in this particular breast.  Both breasts were checked and both sides appeared negative.  The patient is still concerned about the sensitivity around the nipple and the areola.  Despite this no lumps or masses were felt.  Abdominal: Soft. Bowel sounds are normal. He exhibits distension. He exhibits no mass. There is no tenderness. There is no rebound and no guarding. A hernia is present.  Patient does have a ventral hernia that is incisional in nature.  It is not causing any pain.  There is no liver or spleen enlargement there are no  masses no bruits and no inguinal adenopathy.  Musculoskeletal: Normal range of motion. He exhibits tenderness. He exhibits no edema.  Minimal tenderness medial joint line left knee  Lymphadenopathy:    He has no cervical adenopathy.  Neurological: He is alert and oriented to person, place, and time. He has normal reflexes. No cranial nerve deficit.  Skin: Skin is warm and dry. No rash noted.  Psychiatric: He has a normal mood and affect. His behavior is normal. Judgment and thought content normal.  The patient's mood and affect were normal his behavior was normal and he is alert and thankful for his good health at this point in time.  Nursing note and vitals reviewed.  BP (!) 149/85 (BP Location: Right Arm)   Pulse 78   Temp (!) 97.3 F (36.3 C) (Oral)   Ht 6' (1.829 m)   Wt 227 lb (  103 kg)   BMI 30.79 kg/m         Assessment & Plan:  1. Essential hypertension -Continue with current treatment and sodium restriction - amLODipine (NORVASC) 10 MG tablet; TAKE ONE (1) TABLET EACH DAY  Dispense: 90 tablet; Refill: 3 - hydrochlorothiazide (HYDRODIURIL) 25 MG tablet; TAKE ONE (1) TABLET EACH DAY  Dispense: 90 tablet; Refill: 3  2. Vitamin D deficiency -Continue with vitamin D replacement  3. Benign prostatic hyperplasia, unspecified whether lower urinary tract symptoms present -Patient has nocturia x1 and otherwise no problems.  4. Pure hypercholesterolemia -Cholesterol numbers were stable and good and he will continue with current treatment and as aggressive therapeutic lifestyle changes as possible  5. Hyperlipidemia - atorvastatin (LIPITOR) 40 MG tablet; TAKE ONE (1) TABLET EACH DAY  Dispense: 90 tablet; Refill: 3  6. Malignant neoplasm of liver, unspecified liver malignancy type ([place]) -Continue to follow-up with oncology  7. Chronic pain of left knee - Ambulatory referral to Orthopedic Surgery  8. Hepatocellular carcinoma ([place]) -Follow-up with oncology in June and get CT  scan as planned  9. Decreased potassium in the blood -Potassium is good he will stay on current dose of potassium  10. Chest wall discomfort - MM Digital Diagnostic Bilat; Future - US BREAST LTD UNI RIGHT INC AXILLA; Future  11. Breast pain in male - MM Digital Diagnostic Bilat; Future - US BREAST LTD UNI RIGHT INC AXILLA; Future  12. Pain of right breast - MM Digital Diagnostic Bilat; Future - US BREAST LTD UNI RIGHT INC AXILLA; Future  Meds ordered this encounter  Medications  . amLODipine (NORVASC) 10 MG tablet    Sig: TAKE ONE (1) TABLET EACH DAY    Dispense:  90 tablet    Refill:  3  . hydrochlorothiazide (HYDRODIURIL) 25 MG tablet    Sig: TAKE ONE (1) TABLET EACH DAY    Dispense:  90 tablet    Refill:  3  . atorvastatin (LIPITOR) 40 MG tablet    Sig: TAKE ONE (1) TABLET EACH DAY    Dispense:  90 tablet    Refill:  3  . potassium chloride (K-DUR) 10 MEQ tablet    Sig: TAKE ONE (1) TABLET EACH DAY    Dispense:  90 tablet    Refill:  3  . pantoprazole (PROTONIX) 40 MG tablet    Sig: TAKE ONE (1) TABLET EACH DAY    Dispense:  90 tablet    Refill:  3   Patient Instructions                       Medicare Annual Wellness Visit  [facility] and the medical providers at [facility] strive to bring you the best medical care.  In doing so we not only want to address your current medical conditions and concerns but also to detect new conditions early and prevent illness, disease and health-related problems.    Medicare offers a yearly Wellness Visit which allows our clinical staff to assess your need for preventative services including immunizations, lifestyle education, counseling to decrease risk of preventable diseases and screening for fall risk and other medical concerns.    This visit is provided free of charge (no copay) for all Medicare recipients. The clinical pharmacists at [facility] have begun to conduct these  Wellness Visits which will also include a thorough review of all your medications.    As you primary medical provider recommend that you  make an appointment for your Annual Wellness Visit if you have not done so already this year.  You may set up this appointment before you leave today or you may call back ([phone]) and schedule an appointment.  Please make sure when you call that you mention that you are scheduling your Annual Wellness Visit with the clinical pharmacist so that the appointment may be made for the proper length of time.     Continue current medications. Continue good therapeutic lifestyle changes which include good diet and exercise. Fall precautions discussed with patient. If an FOBT was given today- please return it to our front desk. If you are over 20 years old - you may need Prevnar 39 or the adult Pneumonia vaccine.  **Flu shots are available--- please call and schedule a FLU-CLINIC appointment**  After your visit with [place] today you will receive a survey in the mail or online from [employer] regarding your care with [place]. Please take a moment to fill this out. Your feedback is very important to [place] as you can help [place] better understand your patient needs as well as improve your experience and satisfaction. WE CARE ABOUT YOU!!!   We will arrange to get a mammogram and possibly an ultrasound of the breast tissue where your soreness is noted. Follow-up with oncologist as planned in June We will also arrange for you to see the orthopedic surgeon that comes to this office for your ongoing left knee pain and he may need to do another injection Take a copy of the blood work that you have had done here with you to your visit to [facility].  Arrie Senate MD

## 2017-06-19 ENCOUNTER — Telehealth: Payer: Self-pay | Admitting: Family Medicine

## 2017-06-19 NOTE — Telephone Encounter (Signed)
Wife given the appointment date/time

## 2017-06-24 ENCOUNTER — Ambulatory Visit
Admission: RE | Admit: 2017-06-24 | Discharge: 2017-06-24 | Disposition: A | Discharge status: Home / Self Care | Payer: PPO | Source: Ambulatory Visit | Attending: Family Medicine | Admitting: Family Medicine

## 2017-06-24 ENCOUNTER — Ambulatory Visit: Payer: Self-pay

## 2017-06-24 DIAGNOSIS — R928 Other abnormal and inconclusive findings on diagnostic imaging of breast: Secondary | ICD-10-CM | POA: Diagnosis not present

## 2017-06-24 DIAGNOSIS — R0789 Other chest pain: Secondary | ICD-10-CM

## 2017-06-24 DIAGNOSIS — N644 Mastodynia: Secondary | ICD-10-CM

## 2017-07-31 ENCOUNTER — Other Ambulatory Visit (INDEPENDENT_AMBULATORY_CARE_PROVIDER_SITE_OTHER): Payer: PPO

## 2017-07-31 ENCOUNTER — Other Ambulatory Visit: Payer: Self-pay | Admitting: Orthopedic Surgery

## 2017-07-31 DIAGNOSIS — M25562 Pain in left knee: Secondary | ICD-10-CM

## 2017-07-31 DIAGNOSIS — R52 Pain, unspecified: Secondary | ICD-10-CM

## 2017-07-31 DIAGNOSIS — M1712 Unilateral primary osteoarthritis, left knee: Secondary | ICD-10-CM | POA: Diagnosis not present

## 2017-08-05 DIAGNOSIS — D040 Carcinoma in situ of skin of lip: Secondary | ICD-10-CM | POA: Diagnosis not present

## 2017-08-05 DIAGNOSIS — D485 Neoplasm of uncertain behavior of skin: Secondary | ICD-10-CM | POA: Diagnosis not present

## 2017-08-05 DIAGNOSIS — Z85828 Personal history of other malignant neoplasm of skin: Secondary | ICD-10-CM | POA: Diagnosis not present

## 2017-08-05 DIAGNOSIS — L570 Actinic keratosis: Secondary | ICD-10-CM | POA: Diagnosis not present

## 2017-08-13 DIAGNOSIS — C4402 Squamous cell carcinoma of skin of lip: Secondary | ICD-10-CM | POA: Diagnosis not present

## 2017-08-13 DIAGNOSIS — D040 Carcinoma in situ of skin of lip: Secondary | ICD-10-CM | POA: Diagnosis not present

## 2017-08-29 ENCOUNTER — Encounter: Payer: Self-pay | Admitting: Family Medicine

## 2017-08-29 ENCOUNTER — Ambulatory Visit (INDEPENDENT_AMBULATORY_CARE_PROVIDER_SITE_OTHER): Payer: PPO

## 2017-08-29 ENCOUNTER — Ambulatory Visit (INDEPENDENT_AMBULATORY_CARE_PROVIDER_SITE_OTHER): Payer: PPO | Admitting: Family Medicine

## 2017-08-29 VITALS — BP 140/87 | HR 100 | Temp 97.8°F | Ht 72.0 in | Wt 221.0 lb

## 2017-08-29 DIAGNOSIS — R011 Cardiac murmur, unspecified: Secondary | ICD-10-CM

## 2017-08-29 DIAGNOSIS — R0609 Other forms of dyspnea: Secondary | ICD-10-CM

## 2017-08-29 DIAGNOSIS — R0602 Shortness of breath: Secondary | ICD-10-CM | POA: Diagnosis not present

## 2017-08-29 NOTE — Patient Instructions (Addendum)
You had labs performed today.  You may call the office tomorrow morning for results.  If shortness of breath gets worse, he develops a bloody cough, fevers, chest pain, seek immediate medical attention.   Shortness of Breath, Adult Shortness of breath means you have trouble breathing. Your lungs are organs for breathing. Follow these instructions at home: Pay attention to any changes in your symptoms. Take these actions to help with your condition:  Do not smoke. Smoking can cause shortness of breath. If you need help to quit smoking, ask your doctor.  Avoid things that can make it harder to breathe, such as: ? Mold. ? Dust. ? Air pollution. ? Chemical smells. ? Things that can cause allergy symptoms (allergens), if you have allergies.  Keep your living space clean and free of mold and dust.  Rest as needed. Slowly return to your usual activities.  Take over-the-counter and prescription medicines, including oxygen and inhaled medicines, only as told by your doctor.  Keep all follow-up visits as told by your doctor. This is important.  Contact a doctor if:  Your condition does not get better as soon as expected.  You have a hard time doing your normal activities, even after you rest.  You have new symptoms. Get help right away if:  You have trouble breathing when you are resting.  You feel light-headed or you faint.  You have a cough that is not helped by medicines.  You cough up blood.  You have pain with breathing.  You have pain in your chest, arms, shoulders, or belly (abdomen).  You have a fever.  You cannot walk up stairs.  You cannot exercise the way you normally do. This information is not intended to replace advice given to you by your health care provider. Make sure you discuss any questions you have with your health care provider. Document Released: 07/17/2007 Document Revised: 02/15/2016 Document Reviewed: 02/15/2016 Elsevier Interactive Patient  Education  2017 [employer].

## 2017-08-29 NOTE — Progress Notes (Signed)
Subjective: CC: Dyspnea on exertion  PCP: Chipper Herb, MD [id number] [name] is a 72 y.o. male presenting to clinic today for:  1. Dyspnea on exertion Patient is accompanied by his wife who notes that he has had several months of dyspnea on exertion which have worsened over the last week.  He reports associated increased heart rate and notes that he had a diarrheal illness preceding worsening of symptoms.  He reports several episodes of nonbloody diarrhea that stopped on Tuesday after he took Kaopectate.  He has had normal bowel movement since that time.  He has been hydrating with Gatorade and water.  Denies any chest pain, hemoptysis, fevers, recent travel.  He reports chronic lower extremity edema with right being greater than left, citing that he had a superficial thrombosis in the right lower extremity after a surgical procedure.  Past medical history is significant for asbestosis, which she is followed by Dr. Pearlie Oyster in [place] for.  He also has a history of liver cancer status post resection x3 and chemotherapy.  Last treatment for cancer was greater than 1 year ago.  He notes that his follow-up this spring was good and there is no evidence of recurrence of his cancer on CT scan.   ROS: Per HPI  No Known Allergies Past Medical History:  Diagnosis Date  . Asbestos exposure   . Cataract   . Hyperlipidemia   . Hypertension   . Liver cancer ([place])     Current Outpatient Medications:  .  amLODipine (NORVASC) 10 MG tablet, TAKE ONE (1) TABLET EACH DAY, Disp: 90 tablet, Rfl: 3 .  atorvastatin (LIPITOR) 40 MG tablet, TAKE ONE (1) TABLET EACH DAY, Disp: 90 tablet, Rfl: 3 .  cholecalciferol (VITAMIN D) 1000 UNITS tablet, Take 1,000 Units by mouth daily., Disp: , Rfl:  .  hydrochlorothiazide (HYDRODIURIL) 25 MG tablet, TAKE ONE (1) TABLET EACH DAY, Disp: 90 tablet, Rfl: 3 .  pantoprazole (PROTONIX) 40 MG tablet, TAKE ONE (1) TABLET EACH DAY, Disp: 90 tablet, Rfl: 3 .  potassium  chloride (K-DUR) 10 MEQ tablet, TAKE ONE (1) TABLET EACH DAY, Disp: 90 tablet, Rfl: 3 Social History   Socioeconomic History  . Marital status: Married    Spouse name: Not on file  . Number of children: Not on file  . Years of education: Not on file  . Highest education level: Not on file  Occupational History  . Not on file  Social Needs  . Financial resource strain: Not on file  . Food insecurity:    Worry: Not on file    Inability: Not on file  . Transportation needs:    Medical: Not on file    Non-medical: Not on file  Tobacco Use  . Smoking status: Passive Smoke Exposure - Never Smoker  . Smokeless tobacco: Never Used  Substance and Sexual Activity  . Alcohol use: No  . Drug use: No  . Sexual activity: Yes  Lifestyle  . Physical activity:    Days per week: Not on file    Minutes per session: Not on file  . Stress: Not on file  Relationships  . Social connections:    Talks on phone: Not on file    Gets together: Not on file    Attends religious service: Not on file    Active member of club or organization: Not on file    Attends meetings of clubs or organizations: Not on file    Relationship status: Not on file  .  Intimate partner violence:    Fear of current or ex partner: Not on file    Emotionally abused: Not on file    Physically abused: Not on file    Forced sexual activity: Not on file  Other Topics Concern  . Not on file  Social History Narrative  . Not on file   Family History  Problem Relation Age of Onset  . Alzheimer's disease Mother   . Cancer Mother 56       breast  . Breast cancer Mother   . Alzheimer's disease Father   . Cancer Father        lymph nodes- jaw  . Hypertension Father   . Heart disease Father        heart attack   . Cancer Sister        ovarian  . Hypertension Sister   . GI problems Sister   . Clotting disorder Paternal Grandfather     Objective: Office vital signs reviewed. BP 140/87   Pulse 100   Temp 97.8 F  (36.6 C) (Oral)   Ht 6' (1.829 m)   Wt 221 lb (100.2 kg)   SpO2 94%   BMI 29.97 kg/m   Physical Examination:  General: Awake, alert, well nourished, No acute distress HEENT: Normal, sclera white    Throat: moist mucus membranes Cardio: regular rate and rhythm, S1S2 heard, soft systolic murmur appreciated over the area of the tricuspid. Pulm: clear to auscultation bilaterally, no wheezes, rhonchi or rales; normal work of breathing on room air Extremities: warm, well perfused, 1+ pitting edema to knees bilaterally R>L, No cyanosis or clubbing; +2 pulses bilaterally.  Negative Homans sign.  No significant calf swelling or tenderness to palpation.  Dg Chest 2 View  Result Date: 08/29/2017 CLINICAL DATA:  Worsening shortness of breath with exertion EXAM: CHEST - 2 VIEW COMPARISON:  None. FINDINGS: Left Port-A-Cath in place with the tip in the SVC. Slight elevation of the right hemidiaphragm. Heart and mediastinal contours are within normal limits. No focal opacities or effusions. No acute bony abnormality. IMPRESSION: No active cardiopulmonary disease. Electronically Signed   By: Rolm Baptise M.D.   On: 08/29/2017 11:45    Assessment/ Plan: 72 y.o. male   1. Dyspnea on exertion Normal work of breathing and oxygenation on room air.  With exertion, he does desaturate to 89% and heart rate does increase to 110s.  His physical exam was remarkable for a soft systolic murmur appreciated over the tricuspid area.  He also had 1+ pitting edema to knees bilaterally with right being greater than left.  He noted this to be chronic.  Given worsening shortness of breath, chest x-ray was obtained.  Personal review of chest x-ray demonstrated a Port-A-Cath in the left side of the chest.  No significant opacities appreciated.  Radiologist confirms no evidence of active cardiopulmonary disease.  Will check BNP, CBC, CMP and d-dimer for a well's score of 1.5 (tachycardia with exertion).  Patient to call tomorrow  morning with results.  Reasons for emergent evaluation emergency department discussed.  Both he and his wife voiced good understanding. - CBC with Differential - Brain natriuretic peptide - CMP14+EGFR - DG Chest 2 View; Future - D-dimer, quantitative (not at [facility]) - Ambulatory referral to Cardiology  2. Newly recognized heart murmur Referred to cardiology for further evaluation. - Ambulatory referral to Cardiology   Orders Placed This Encounter  Procedures  . DG Chest 2 View    Standing Status:  Future    Number of Occurrences:   1    Standing Expiration Date:   10/31/2018    Order Specific Question:   Reason for Exam (SYMPTOM  OR DIAGNOSIS REQUIRED)    Answer:   worsening dyspnea on exertion.  history of asbestosis.  slight cough.    Order Specific Question:   Preferred imaging location?    Answer:   Internal    Order Specific Question:   Radiology Contrast Protocol - do NOT remove file path    Answer:   [email or web address]  . CBC with Differential  . Brain natriuretic peptide  . CMP14+EGFR  . D-dimer, quantitative (not at [facility])  . Ambulatory referral to Cardiology    Referral Priority:   Routine    Referral Type:   Consultation    Referral Reason:   Specialty Services Required    Requested Specialty:   Cardiology    Number of Visits Requested:   [place], [place] [phone]

## 2017-08-30 ENCOUNTER — Emergency Department (HOSPITAL_COMMUNITY): Payer: PPO

## 2017-08-30 ENCOUNTER — Encounter (HOSPITAL_COMMUNITY): Payer: Self-pay | Admitting: Emergency Medicine

## 2017-08-30 ENCOUNTER — Other Ambulatory Visit: Payer: Self-pay

## 2017-08-30 ENCOUNTER — Inpatient Hospital Stay (HOSPITAL_COMMUNITY)
Admission: EM | Admit: 2017-08-30 | Discharge: 2017-09-02 | DRG: 175 | Disposition: A | Discharge status: Home / Self Care | Payer: PPO | Attending: Internal Medicine | Admitting: Internal Medicine

## 2017-08-30 ENCOUNTER — Telehealth: Payer: Self-pay | Admitting: Family Medicine

## 2017-08-30 DIAGNOSIS — N400 Enlarged prostate without lower urinary tract symptoms: Secondary | ICD-10-CM | POA: Diagnosis present

## 2017-08-30 DIAGNOSIS — E785 Hyperlipidemia, unspecified: Secondary | ICD-10-CM | POA: Diagnosis not present

## 2017-08-30 DIAGNOSIS — Z8672 Personal history of thrombophlebitis: Secondary | ICD-10-CM | POA: Diagnosis not present

## 2017-08-30 DIAGNOSIS — Z923 Personal history of irradiation: Secondary | ICD-10-CM | POA: Diagnosis not present

## 2017-08-30 DIAGNOSIS — Z79899 Other long term (current) drug therapy: Secondary | ICD-10-CM

## 2017-08-30 DIAGNOSIS — Z9842 Cataract extraction status, left eye: Secondary | ICD-10-CM | POA: Diagnosis not present

## 2017-08-30 DIAGNOSIS — I10 Essential (primary) hypertension: Secondary | ICD-10-CM | POA: Diagnosis not present

## 2017-08-30 DIAGNOSIS — I2602 Saddle embolus of pulmonary artery with acute cor pulmonale: Principal | ICD-10-CM

## 2017-08-30 DIAGNOSIS — I2692 Saddle embolus of pulmonary artery without acute cor pulmonale: Secondary | ICD-10-CM

## 2017-08-30 DIAGNOSIS — I361 Nonrheumatic tricuspid (valve) insufficiency: Secondary | ICD-10-CM | POA: Diagnosis not present

## 2017-08-30 DIAGNOSIS — Z961 Presence of intraocular lens: Secondary | ICD-10-CM | POA: Diagnosis not present

## 2017-08-30 DIAGNOSIS — Z77090 Contact with and (suspected) exposure to asbestos: Secondary | ICD-10-CM | POA: Diagnosis not present

## 2017-08-30 DIAGNOSIS — J9601 Acute respiratory failure with hypoxia: Secondary | ICD-10-CM | POA: Diagnosis not present

## 2017-08-30 DIAGNOSIS — Z8505 Personal history of malignant neoplasm of liver: Secondary | ICD-10-CM

## 2017-08-30 DIAGNOSIS — C221 Intrahepatic bile duct carcinoma: Secondary | ICD-10-CM | POA: Diagnosis not present

## 2017-08-30 DIAGNOSIS — Z86711 Personal history of pulmonary embolism: Secondary | ICD-10-CM | POA: Diagnosis present

## 2017-08-30 DIAGNOSIS — Z8249 Family history of ischemic heart disease and other diseases of the circulatory system: Secondary | ICD-10-CM

## 2017-08-30 DIAGNOSIS — C229 Malignant neoplasm of liver, not specified as primary or secondary: Secondary | ICD-10-CM

## 2017-08-30 DIAGNOSIS — R0602 Shortness of breath: Secondary | ICD-10-CM | POA: Diagnosis not present

## 2017-08-30 DIAGNOSIS — E7800 Pure hypercholesterolemia, unspecified: Secondary | ICD-10-CM | POA: Diagnosis not present

## 2017-08-30 LAB — CMP14+EGFR
A/G RATIO: 1.6 (ref 1.2–2.2)
ALBUMIN: 4.1 g/dL (ref 3.5–4.8)
ALT: 31 IU/L (ref 0–44)
AST: 20 IU/L (ref 0–40)
Alkaline Phosphatase: 83 IU/L (ref 39–117)
BILIRUBIN TOTAL: 0.5 mg/dL (ref 0.0–1.2)
BUN / CREAT RATIO: 16 (ref 10–24)
BUN: 17 mg/dL (ref 8–27)
CHLORIDE: 101 mmol/L (ref 96–106)
CO2: 22 mmol/L (ref 20–29)
Calcium: 9.7 mg/dL (ref 8.6–10.2)
Creatinine, Ser: 1.09 mg/dL (ref 0.76–1.27)
GFR calc non Af Amer: 68 mL/min/{1.73_m2} (ref >59)
GFR, EST AFRICAN AMERICAN: 79 mL/min/{1.73_m2} (ref >59)
Globulin, Total: 2.6 g/dL (ref 1.5–4.5)
Glucose: 110 mg/dL — ABNORMAL HIGH (ref 65–99)
POTASSIUM: 3.6 mmol/L (ref 3.5–5.2)
Sodium: 141 mmol/L (ref 134–144)
TOTAL PROTEIN: 6.7 g/dL (ref 6.0–8.5)

## 2017-08-30 LAB — COMPREHENSIVE METABOLIC PANEL
ALBUMIN: 3.8 g/dL (ref 3.5–5.0)
ALT: 28 U/L (ref 0–44)
AST: 21 U/L (ref 15–41)
Alkaline Phosphatase: 71 U/L (ref 38–126)
Anion gap: 10 (ref 5–15)
BUN: 19 mg/dL (ref 8–23)
CHLORIDE: 102 mmol/L (ref 98–111)
CO2: 26 mmol/L (ref 22–32)
CREATININE: 1.2 mg/dL (ref 0.61–1.24)
Calcium: 9.2 mg/dL (ref 8.9–10.3)
GFR calc Af Amer: >60 mL/min (ref >60)
GFR, EST NON AFRICAN AMERICAN: 59 mL/min — AB (ref >60)
GLUCOSE: 97 mg/dL (ref 70–99)
POTASSIUM: 3.4 mmol/L — AB (ref 3.5–5.1)
SODIUM: 138 mmol/L (ref 135–145)
Total Bilirubin: 0.9 mg/dL (ref 0.3–1.2)
Total Protein: 7.4 g/dL (ref 6.5–8.1)

## 2017-08-30 LAB — CBC WITH DIFFERENTIAL/PLATELET
BASOS ABS: 0 10*3/uL (ref 0.0–0.2)
BASOS: 0 %
EOS (ABSOLUTE): 0.2 10*3/uL (ref 0.0–0.4)
Eos: 2 %
HEMATOCRIT: 41.3 % (ref 37.5–51.0)
HEMOGLOBIN: 13.3 g/dL (ref 13.0–17.7)
Immature Grans (Abs): 0 10*3/uL (ref 0.0–0.1)
Immature Granulocytes: 0 %
LYMPHS: 9 %
Lymphocytes Absolute: 0.6 10*3/uL — ABNORMAL LOW (ref 0.7–3.1)
MCH: 29.2 pg (ref 26.6–33.0)
MCHC: 32.2 g/dL (ref 31.5–35.7)
MCV: 91 fL (ref 79–97)
MONOCYTES: 9 %
Monocytes Absolute: 0.6 10*3/uL (ref 0.1–0.9)
NEUTROS ABS: 5.4 10*3/uL (ref 1.4–7.0)
Neutrophils: 80 %
Platelets: 140 10*3/uL — ABNORMAL LOW (ref 150–450)
RBC: 4.55 x10E6/uL (ref 4.14–5.80)
RDW: 15 % (ref 12.3–15.4)
WBC: 6.8 10*3/uL (ref 3.4–10.8)

## 2017-08-30 LAB — CBC
HEMATOCRIT: 41.1 % (ref 39.0–52.0)
HEMOGLOBIN: 13.5 g/dL (ref 13.0–17.0)
MCH: 29.6 pg (ref 26.0–34.0)
MCHC: 32.8 g/dL (ref 30.0–36.0)
MCV: 90.1 fL (ref 78.0–100.0)
PLATELETS: 129 10*3/uL — AB (ref 150–400)
RBC: 4.56 MIL/uL (ref 4.22–5.81)
RDW: 14.6 % (ref 11.5–15.5)
WBC: 7 10*3/uL (ref 4.0–10.5)

## 2017-08-30 LAB — PROTIME-INR
INR: 1.01
Prothrombin Time: 13.2 seconds (ref 11.4–15.2)

## 2017-08-30 LAB — TROPONIN I
Troponin I: 0.04 ng/mL (ref <0.03)
Troponin I: 0.03 ng/mL (ref <0.03)

## 2017-08-30 LAB — APTT: aPTT: 28 seconds (ref 24–36)

## 2017-08-30 LAB — HEPARIN LEVEL (UNFRACTIONATED): Heparin Unfractionated: 0.56 IU/mL (ref 0.30–0.70)

## 2017-08-30 LAB — D-DIMER, QUANTITATIVE: D-DIMER: 14.14 mg/L FEU — ABNORMAL HIGH (ref 0.00–0.49)

## 2017-08-30 LAB — BRAIN NATRIURETIC PEPTIDE: BNP: 38.6 pg/mL (ref 0.0–100.0)

## 2017-08-30 MED ORDER — AMLODIPINE BESYLATE 5 MG PO TABS
10.0000 mg | ORAL_TABLET | Freq: Every day | ORAL | Status: DC
  Administered 2017-08-31 – 2017-09-02 (×3): 10 mg via ORAL
  Filled 2017-08-30 (×3): qty 2

## 2017-08-30 MED ORDER — IOPAMIDOL (ISOVUE-370) INJECTION 76%
100.0000 mL | Freq: Once | INTRAVENOUS | Status: AC | PRN
  Administered 2017-08-30: 100 mL via INTRAVENOUS

## 2017-08-30 MED ORDER — ATORVASTATIN CALCIUM 40 MG PO TABS
40.0000 mg | ORAL_TABLET | Freq: Every day | ORAL | Status: DC
  Administered 2017-08-31 – 2017-09-01 (×2): 40 mg via ORAL
  Filled 2017-08-30 (×2): qty 1

## 2017-08-30 MED ORDER — ONDANSETRON HCL 4 MG/2ML IJ SOLN: 4.0000 mg | Freq: Four times a day (QID) | INTRAMUSCULAR | Status: DC | PRN

## 2017-08-30 MED ORDER — ONDANSETRON HCL 4 MG PO TABS: 4.0000 mg | ORAL_TABLET | Freq: Four times a day (QID) | ORAL | Status: DC | PRN

## 2017-08-30 MED ORDER — POTASSIUM CHLORIDE CRYS ER 10 MEQ PO TBCR
10.0000 meq | EXTENDED_RELEASE_TABLET | Freq: Every day | ORAL | Status: DC
  Administered 2017-08-31 – 2017-09-02 (×3): 10 meq via ORAL
  Filled 2017-08-30 (×5): qty 1

## 2017-08-30 MED ORDER — HYDROCHLOROTHIAZIDE 25 MG PO TABS
25.0000 mg | ORAL_TABLET | Freq: Every day | ORAL | Status: DC
  Administered 2017-08-31 – 2017-09-02 (×3): 25 mg via ORAL
  Filled 2017-08-30 (×3): qty 1

## 2017-08-30 MED ORDER — ACETAMINOPHEN 650 MG RE SUPP: 650.0000 mg | Freq: Four times a day (QID) | RECTAL | Status: DC | PRN

## 2017-08-30 MED ORDER — SENNOSIDES-DOCUSATE SODIUM 8.6-50 MG PO TABS: 1.0000 | ORAL_TABLET | Freq: Every evening | ORAL | Status: DC | PRN

## 2017-08-30 MED ORDER — ACETAMINOPHEN 325 MG PO TABS: 650.0000 mg | ORAL_TABLET | Freq: Four times a day (QID) | ORAL | Status: DC | PRN

## 2017-08-30 MED ORDER — PANTOPRAZOLE SODIUM 40 MG PO TBEC
40.0000 mg | DELAYED_RELEASE_TABLET | Freq: Every day | ORAL | Status: DC
  Administered 2017-08-31 – 2017-09-02 (×3): 40 mg via ORAL
  Filled 2017-08-30 (×3): qty 1

## 2017-08-30 MED ORDER — HEPARIN (PORCINE) IN NACL 100-0.45 UNIT/ML-% IJ SOLN
1550.0000 [IU]/h | INTRAMUSCULAR | Status: DC
  Administered 2017-08-30 – 2017-09-02 (×5): 1550 [IU]/h via INTRAVENOUS
  Filled 2017-08-30 (×5): qty 250

## 2017-08-30 MED ORDER — HEPARIN BOLUS VIA INFUSION
4000.0000 [IU] | Freq: Once | INTRAVENOUS | Status: AC
  Administered 2017-08-30: 4000 [IU] via INTRAVENOUS

## 2017-08-30 NOTE — Progress Notes (Signed)
ANTICOAGULATION CONSULT NOTE - Initial Consult  Pharmacy Consult for heparin gtt  Indication: pulmonary embolus  No Known Allergies  Patient Measurements: Height: 6' (182.9 cm) Weight: 221 lb (100.2 kg) IBW/kg (Calculated) : 77.6 Heparin Dosing Weight: 98kg  Vital Signs: Temp: 98 F (36.7 C) (07/20 1255) Temp Source: Oral (07/20 1255) BP: 120/66 (07/20 1400) Pulse Rate: 79 (07/20 1400)  Labs: Recent Labs    08/29/17 1153 08/30/17 1346  HGB 13.3  --   HCT 41.3  --   PLT 140*  --   APTT  --  28  LABPROT  --  13.2  INR  --  1.01  CREATININE 1.09 1.20    Estimated Creatinine Clearance: 69.2 mL/min (by C-G formula based on SCr of 1.2 mg/dL).   Medical History: Past Medical History:  Diagnosis Date  . Asbestos exposure   . Cataract   . Hyperlipidemia   . Hypertension   . Liver cancer ([place])     Medications:   (Not in a hospital admission) Scheduled:  . heparin  4,000 Units Intravenous Once   Infusions:  . heparin     PRN:  Anti-infectives (From admission, onward)   None      Assessment: 72 year old male requiring heparin gtt for acute pulmonary embolism. Not on anticoagulants PTA per med rec.   Goal of Therapy:  Heparin level 0.3-0.7 units/ml Monitor platelets by anticoagulation protocol: Yes   Plan:  Give 4000 units bolus x 1 Start heparin infusion at 1550 units/hr Check anti-Xa level in 6 hours and daily while on heparin Continue to monitor H&H and platelets  Grant Cummings 08/30/2017,3:25 PM

## 2017-08-30 NOTE — Telephone Encounter (Signed)
Called to discuss labs, Positive D dimer, recommend he seek help in the ED.   No answer, left VM to call back. I am provider on call and will expect their call.   Laroy Apple, MD [facility] 08/30/2017, 12:09 PM

## 2017-08-30 NOTE — Progress Notes (Signed)
ANTICOAGULATION CONSULT NOTE - Initial Consult  Pharmacy Consult for heparin gtt  Indication: pulmonary embolus  No Known Allergies  Patient Measurements: Height: 6' (182.9 cm) Weight: 217 lb 13 oz (98.8 kg) IBW/kg (Calculated) : 77.6 Heparin Dosing Weight: 98kg  Vital Signs: Temp: 98.3 F (36.8 C) (07/20 2005) Temp Source: Oral (07/20 1700) BP: 163/119 (07/20 2000) Pulse Rate: 82 (07/20 2000)  Labs: Recent Labs    08/29/17 1153 08/30/17 1308 08/30/17 1346 08/30/17 2209  HGB 13.3  --  13.5  --   HCT 41.3  --  41.1  --   PLT 140*  --  129*  --   APTT  --   --  28  --   LABPROT  --   --  13.2  --   INR  --   --  1.01  --   HEPARINUNFRC  --   --   --  0.56  CREATININE 1.09  --  1.20  --   TROPONINI  --  0.04*  --   --     Estimated Creatinine Clearance: 68.8 mL/min (by C-G formula based on SCr of 1.2 mg/dL).   Medical History: Past Medical History:  Diagnosis Date  . Asbestos exposure   . Cataract   . Hyperlipidemia   . Hypertension   . Liver cancer ([place])     Medications:  Medications Prior to Admission  Medication Sig Dispense Refill Last Dose  . amLODipine (NORVASC) 10 MG tablet TAKE ONE (1) TABLET EACH DAY 90 tablet 3 08/30/2017 at Unknown time  . atorvastatin (LIPITOR) 40 MG tablet TAKE ONE (1) TABLET EACH DAY (Patient taking differently: Take 40 mg by mouth every morning. TAKE ONE (1) TABLET EACH DAY) 90 tablet 3 08/30/2017 at Unknown time  . cholecalciferol (VITAMIN D) 1000 UNITS tablet Take 1,000 Units by mouth daily.   08/30/2017 at Unknown time  . Glucosamine-Chondroitin (GLUCOSAMINE CHONDR COMPLEX PO) Take 1 tablet by mouth daily.   08/30/2017 at Unknown time  . hydrochlorothiazide (HYDRODIURIL) 25 MG tablet TAKE ONE (1) TABLET EACH DAY 90 tablet 3 08/30/2017 at Unknown time  . pantoprazole (PROTONIX) 40 MG tablet TAKE ONE (1) TABLET EACH DAY 90 tablet 3 08/30/2017 at Unknown time  . potassium chloride (K-DUR) 10 MEQ tablet TAKE ONE (1) TABLET EACH DAY 90  tablet 3 08/30/2017 at Unknown time   Scheduled:  . [START ON 08/31/2017] amLODipine  10 mg Oral Daily  . [START ON 08/31/2017] atorvastatin  40 mg Oral q1800  . [START ON 08/31/2017] hydrochlorothiazide  25 mg Oral Daily  . [START ON 08/31/2017] pantoprazole  40 mg Oral Daily  . [START ON 08/31/2017] potassium chloride  10 mEq Oral Daily   Infusions:  . heparin 1,550 Units/hr (08/30/17 1542)   PRN:  Anti-infectives (From admission, onward)   None      Assessment: 72 year old male requiring heparin gtt for acute pulmonary embolism. Not on anticoagulants PTA per med rec.   Goal of Therapy:  Heparin level 0.3-0.7 units/ml Monitor platelets by anticoagulation protocol: Yes   Plan:  Give 4000 units bolus x 1 Start heparin infusion at 1550 units/hr Check anti-Xa level in 6 hours and daily while on heparin Continue to monitor H&H and platelets  HL 0.56, therapeutic. Continue heparin 1550 units/hr will recheck with AM labs   Grant Cummings Grant Cummings 08/30/2017,10:59 PM

## 2017-08-30 NOTE — ED Provider Notes (Signed)
[facility] EMERGENCY DEPARTMENT Provider Note   CSN: [id number] Arrival date & time: 08/30/17  1252     History   Chief Complaint Chief Complaint  Patient presents with  . Shortness of Breath    HPI Grant Cummings is a 72 y.o. male.  HPI  72 year old male with history of hypertension, remote history of cholangiocarcinoma and superficial thrombophlebitis comes in with chief complaint of elevated d-dimer and shortness of breath..  Patient reports that over the past 3 weeks he has noted increasing shortness of breath.  Shortness of breath is usually exertional, and now he is having difficulty going up and down his basement which prompted him to see his PCP.  Patient's primary care doctor ordered a BNP, which is normal and a d-dimer which is significantly elevated -therefore patient was asked to come to the ER.  Patient denies any chest pain, dizziness, near fainting episodes.  He has a cough, however no hemoptysis.  No recent long distance travels, major surgeries and patient has been cancer free for 3 years.  Patient has had a history of GI bleed, but the underlying process was radiation therapy.  He has not had any bleeding recently and denies any abdominal pain. Past Medical History:  Diagnosis Date  . Asbestos exposure   . Cataract   . Hyperlipidemia   . Hypertension   . Liver cancer [facility])     Patient Active Problem List   Diagnosis Date Noted  . Blood type A- 05/08/2015  . Hernia of abdominal wall 05/08/2015  . Edema 11/22/2014  . BPH (benign prostatic hyperplasia) 03/22/2013  . Vitamin D deficiency 03/22/2013  . Hearing deficit 03/22/2013  . Liver cancer ([place])   . Hyperlipidemia   . Hypertension     Past Surgical History:  Procedure Laterality Date  . CARPAL TUNNEL WITH CUBITAL TUNNEL Right   . CHOLECYSTECTOMY    . EYE SURGERY Left 2017   lens implant to correct stigmatism / cataract  . KNEE SURGERY     left  . partial liver removal     x 2   .  RADIOFREQUENCY ABLATION LIVER TUMOR     with biopsy  - NEG        Home Medications    Prior to Admission medications   Medication Sig Start Date End Date Taking? Authorizing Provider  amLODipine (NORVASC) 10 MG tablet TAKE ONE (1) TABLET EACH DAY 06/17/17   Chipper Herb, MD  atorvastatin (LIPITOR) 40 MG tablet TAKE ONE (1) TABLET EACH DAY 06/17/17   Chipper Herb, MD  cholecalciferol (VITAMIN D) 1000 UNITS tablet Take 1,000 Units by mouth daily.    [provider]  hydrochlorothiazide (HYDRODIURIL) 25 MG tablet TAKE ONE (1) TABLET EACH DAY 06/17/17   Chipper Herb, MD  pantoprazole (PROTONIX) 40 MG tablet TAKE ONE (1) TABLET EACH DAY 06/17/17   Chipper Herb, MD  potassium chloride (K-DUR) 10 MEQ tablet TAKE ONE (1) TABLET EACH DAY 06/17/17   Chipper Herb, MD    Family History Family History  Problem Relation Age of Onset  . Alzheimer's disease Mother   . Cancer Mother 82       breast  . Breast cancer Mother   . Alzheimer's disease Father   . Cancer Father        lymph nodes- jaw  . Hypertension Father   . Heart disease Father        heart attack   . Cancer Sister  ovarian  . Hypertension Sister   . GI problems Sister   . Clotting disorder Paternal Grandfather     Social History Social History   Tobacco Use  . Smoking status: Passive Smoke Exposure - Never Smoker  . Smokeless tobacco: Never Used  Substance Use Topics  . Alcohol use: No  . Drug use: No     Allergies   Patient has no known allergies.   Review of Systems Review of Systems  Constitutional: Positive for activity change and fatigue.  Respiratory: Positive for shortness of breath.   Cardiovascular: Negative for chest pain.  Neurological: Negative for syncope.  Hematological: Does not bruise/bleed easily.  All other systems reviewed and are negative.    Physical Exam Updated Vital Signs BP 120/66   Pulse 79   Temp 98 F (36.7 C) (Oral)   Resp (!) 21   Ht 6' (1.829 m)    Wt 100.2 kg (221 lb)   SpO2 90%   BMI 29.97 kg/m   Physical Exam  Constitutional: He is oriented to person, place, and time. He appears well-developed.  HENT:  Head: Atraumatic.  Neck: Neck supple.  Cardiovascular: Normal rate.  Pulmonary/Chest: Breath sounds normal. Tachypnea noted. No respiratory distress. He has no decreased breath sounds. He has no wheezes. He has no rhonchi. He has no rales.  Musculoskeletal:       Right lower leg: He exhibits edema. He exhibits no tenderness.       Left lower leg: He exhibits no tenderness and no edema.  Neurological: He is alert and oriented to person, place, and time.  Skin: Skin is warm.  Nursing note and vitals reviewed.    ED Treatments / Results  Labs (all labs ordered are listed, but only abnormal results are displayed) Labs Reviewed  COMPREHENSIVE METABOLIC PANEL - Abnormal; Notable for the following components:      Result Value   Potassium 3.4 (*)    GFR calc non Af Amer 59 (*)    All other components within normal limits  APTT  PROTIME-INR  CBC  HEPARIN LEVEL (UNFRACTIONATED)    EKG EKG Interpretation  Date/Time:  Saturday August 30 2017 13:05:41 EDT Ventricular Rate:  84 PR Interval:    QRS Duration: 104 QT Interval:  398 QTC Calculation: 471 R Axis:   79 Text Interpretation:  Sinus rhythm No acute changes Nonspecific ST and T wave abnormality No old tracing to compare Confirmed by Varney Biles [phone]) on 08/30/2017 1:30:44 PM   Radiology Dg Chest 2 View  Result Date: 08/29/2017 CLINICAL DATA:  Worsening shortness of breath with exertion EXAM: CHEST - 2 VIEW COMPARISON:  None. FINDINGS: Left Port-A-Cath in place with the tip in the SVC. Slight elevation of the right hemidiaphragm. Heart and mediastinal contours are within normal limits. No focal opacities or effusions. No acute bony abnormality. IMPRESSION: No active cardiopulmonary disease. Electronically Signed   By: Rolm Baptise M.D.   On: 08/29/2017 11:45    Ct Angio Chest Pe W And/or Wo Contrast  Result Date: 08/30/2017 CLINICAL DATA:  Shortness of breath.  History hepatic carcinoma EXAM: CT ANGIOGRAPHY CHEST WITH CONTRAST TECHNIQUE: Multidetector CT imaging of the chest was performed using the standard protocol during bolus administration of intravenous contrast. Multiplanar CT image reconstructions and MIPs were obtained to evaluate the vascular anatomy. CONTRAST:  80 mL ISOVUE-370 IOPAMIDOL (ISOVUE-370) INJECTION 76% COMPARISON:  PET-CT March 08, 2010 and chest radiograph July 24, 2014 FINDINGS: Cardiovascular: There is extensive pulmonary embolus which  extends throughout both main pulmonary arteries as well as extending into multiple upper and lower lobe pulmonary arterial branches. The right ventricle to left ventricle diameter ratio is 1.3, a finding indicative of right heart strain. There is no appreciable thoracic aortic aneurysm or dissection. The visualized great vessels appear normal. No pericardial effusion or pericardial thickening is evident. There are scattered foci of coronary artery calcification. Mediastinum/Nodes: Thyroid appears unremarkable. There are scattered subcentimeter mediastinal lymph nodes. No adenopathy is evident in the thoracic region. No esophageal lesions are appreciable. Lungs/Pleura: There is atelectatic change in both lung bases. There is no appreciable edema or consolidation. No pleural effusion or pleural thickening is evident. Upper Abdomen: Postoperative changes noted along the rightward aspect of the liver. There is scarring in the right upper quadrant of the abdomen in this area. Visualized upper abdominal structures otherwise appear unremarkable. Musculoskeletal: There is degenerative change in the thoracic spine. There are no evident blastic or lytic bone lesions. Review of the MIP images confirms the above findings. IMPRESSION: 1. Extensive pulmonary embolus arising throughout both main pulmonary arteries in a  saddle type distribution extension into multiple upper and lower lobe pulmonary arteries. There is evidence of right heart strain. Positive for acute PE with CT evidence of right heart strain (RV/LV Ratio = 1.3) consistent with at least submassive (intermediate risk) PE. The presence of right heart strain has been associated with an increased risk of morbidity and mortality. Please activate Code PE by paging [phone]. 2. No thoracic aortic aneurysm or dissection. There are scattered foci of coronary artery calcification. 3. Postoperative change in the liver with adjacent scarring in the posterior right upper quadrant region. 4. Areas of mild atelectatic change. No lung edema or consolidation. 5.  No demonstrable adenopathy. Critical Value/emergent results were called by telephone at the time of interpretation on 08/30/2017 at 2:41 pm to Dr. Varney Biles , who verbally acknowledged these results. Electronically Signed   By: Lowella Grip III M.D.   On: 08/30/2017 14:43    Procedures Procedures (including critical care time)  CRITICAL CARE Performed by: Ramsey Midgett   Total critical care time: 56 minutes  Critical care time was exclusive of separately billable procedures and treating other patients.  Critical care was necessary to treat or prevent imminent or life-threatening deterioration.  Critical care was time spent personally by me on the following activities: development of treatment plan with patient and/or surrogate as well as nursing, discussions with consultants, evaluation of patient's response to treatment, examination of patient, obtaining history from patient or surrogate, ordering and performing treatments and interventions, ordering and review of laboratory studies, ordering and review of radiographic studies, pulse oximetry and re-evaluation of patient's condition.    Medications Ordered in ED Medications  heparin bolus via infusion 4,000 Units (has no administration  in time range)  heparin ADULT infusion 100 units/mL (25000 units/226mL sodium chloride 0.45%) (has no administration in time range)  iopamidol (ISOVUE-370) 76 % injection 100 mL (100 mLs Intravenous Contrast Given 08/30/17 1419)     Initial Impression / Assessment and Plan / ED Course  I have reviewed the triage vital signs and the nursing notes.  Pertinent labs & imaging results that were available during my care of the patient were reviewed by me and considered in my medical decision making (see chart for details).     72 year old male comes in with chief complaint of worsening dyspnea on exertion.  Patient is noted to have elevated d-dimer, and CT PE confirms  a saddle pulmonary embolism with right-sided strain.  Patient's O2 sats are between 90 to 93% on room air, he is hypoxic when he ambulates.  With that his blood pressure and other hemodynamics are within normal limits.  We will start patient on heparin. He has had history of GI bleed in the past, but that was more than 3 years ago and was induced by radiation therapy.  He denies any active bleeding.  We think that the benefit outweighs the risk of starting patient on heparin at this time.  Critical care team has been consulted to see if patient is amenable to interventional treatment for the large PE.  Final Clinical Impressions(s) / ED Diagnoses   Final diagnoses:  Acute saddle pulmonary embolism with acute cor pulmonale [facility])    ED Discharge Orders    None       Varney Biles, MD 08/30/17 1536

## 2017-08-30 NOTE — ED Notes (Signed)
Pt placed on 2L of oxygen. 

## 2017-08-30 NOTE — H&P (Signed)
History and Physical  Grant Cummings ZOX:[id number] DOB: [birth year] DOA: 08/30/2017  Referring physician: Dr Kathrynn Humble, ED physician PCP: Chipper Herb, MD  Outpatient Specialists:  Patient Coming From: home  Chief Complaint: SOB  HPI: Grant Cummings is a 72 y.o. 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  Patient seen for shortness of breath over the past 2 to 3 weeks that is been gradual in onset and has continued to get worse.  Dyspnea worse with exertion and improved with rest.  Denies cough, chest pain, chest pressure.  Patient was seen yesterday at PCPs office when ambulating short distances was causing him to become short of breath.  A d-dimer was obtained, which was elevated to 13.  Patient was sent here for evaluation.  No other palliating or provoking factors.  Emergency Department Course: CTA chest shows acute saddle PE with right heart strain: RV/LV ratio equals 1.3.  Review of Systems:   Pt denies any fevers, chills, nausea, vomiting, diarrhea, constipation, abdominal pain, shortness of breath, dyspnea on exertion, orthopnea, cough, wheezing, palpitations, headache, vision changes, lightheadedness, dizziness, melena, rectal bleeding.  Review of systems are otherwise negative  Past Medical History:  Diagnosis Date  . Asbestos exposure   . Cataract   . Hyperlipidemia   . Hypertension   . Liver cancer [facility])    Past Surgical History:  Procedure Laterality Date  . CARPAL TUNNEL WITH CUBITAL TUNNEL Right   . CHOLECYSTECTOMY    . EYE SURGERY Left 2017   lens implant to correct stigmatism / cataract  . KNEE SURGERY     left  . partial liver removal     x 2   . RADIOFREQUENCY ABLATION LIVER TUMOR     with biopsy  - NEG   Social History:  reports that he is a non-smoker but has been exposed to tobacco smoke. He has never used smokeless tobacco. He reports that he does not drink  alcohol or use drugs. Patient lives at home  No Known Allergies  Family History  Problem Relation Age of Onset  . Alzheimer's disease Mother   . Cancer Mother 67       breast  . Breast cancer Mother   . Alzheimer's disease Father   . Cancer Father        lymph nodes- jaw  . Hypertension Father   . Heart disease Father        heart attack   . Cancer Sister        ovarian  . Hypertension Sister   . GI problems Sister   . Clotting disorder Paternal Grandfather       Prior to Admission medications   Medication Sig Start Date End Date Taking? Authorizing Provider  amLODipine (NORVASC) 10 MG tablet TAKE ONE (1) TABLET EACH DAY 06/17/17  Yes Chipper Herb, MD  atorvastatin (LIPITOR) 40 MG tablet TAKE ONE (1) TABLET EACH DAY Patient taking differently: Take 40 mg by mouth every morning. TAKE ONE (1) TABLET EACH DAY 06/17/17  Yes Chipper Herb, MD  cholecalciferol (VITAMIN D) 1000 UNITS tablet Take 1,000 Units by mouth daily.   Yes [provider]  Glucosamine-Chondroitin (GLUCOSAMINE CHONDR COMPLEX PO) Take 1 tablet by mouth daily.   Yes [provider]  hydrochlorothiazide (HYDRODIURIL) 25 MG tablet TAKE ONE (1) TABLET EACH DAY 06/17/17  Yes Chipper Herb, MD  pantoprazole (PROTONIX) 40 MG tablet TAKE ONE (1) TABLET EACH DAY  06/17/17  Yes Chipper Herb, MD  potassium chloride (K-DUR) 10 MEQ tablet TAKE ONE (1) TABLET EACH DAY 06/17/17  Yes Chipper Herb, MD    Physical Exam: BP (!) 143/91   Pulse 81   Temp 98 F (36.7 C) (Oral)   Resp (!) 24   Ht 6' (1.829 m)   Wt 100.2 kg (221 lb)   SpO2 97%   BMI 29.97 kg/m   . General: Elderly Caucasian male who appears about stated age. Awake and alert and oriented x3. No acute cardiopulmonary distress.  Marland Kitchen HEENT: Normocephalic atraumatic.  Right and left ears normal in appearance.  Pupils equal, round, reactive to light. Extraocular muscles are intact. Sclerae anicteric and noninjected.  Moist mucosal membranes. No  mucosal lesions.  . Neck: Neck supple without lymphadenopathy. No carotid bruits. No masses palpated.  . Cardiovascular: Regular rate with normal S1-S2 sounds. No murmurs, rubs, gallops auscultated. No JVD.  Marland Kitchen Respiratory: Good respiratory effort with no wheezes, rales, rhonchi. Lungs clear to auscultation bilaterally.  No accessory muscle use. . Abdomen: Soft, nontender, nondistended. Active bowel sounds. No masses or hepatosplenomegaly  . Skin: No rashes, lesions, or ulcerations.  Dry, warm to touch. 2+ dorsalis pedis and radial pulses. . Musculoskeletal: No calf or leg pain. All major joints not erythematous nontender.  No upper or lower joint deformation.  Good ROM.  No contractures  . Psychiatric: Intact judgment and insight. Pleasant and cooperative. . Neurologic: No focal neurological deficits. Strength is 5/5 and symmetric in upper and lower extremities.  Cranial nerves II through XII are grossly intact.           Labs on Admission: I have personally reviewed following labs and imaging studies  CBC: Recent Labs  Lab 08/29/17 1153 08/30/17 1346  WBC 6.8 7.0  NEUTROABS 5.4  --   HGB 13.3 13.5  HCT 41.3 41.1  MCV 91 90.1  PLT 140* 161*   Basic Metabolic Panel: Recent Labs  Lab 08/29/17 1153 08/30/17 1346  NA 141 138  K 3.6 3.4*  CL 101 102  CO2 22 26  GLUCOSE 110* 97  BUN 17 19  CREATININE 1.09 1.20  CALCIUM 9.7 9.2   GFR: Estimated Creatinine Clearance: 69.2 mL/min (by C-G formula based on SCr of 1.2 mg/dL). Liver Function Tests: Recent Labs  Lab 08/29/17 1153 08/30/17 1346  AST 20 21  ALT 31 28  ALKPHOS 83 71  BILITOT 0.5 0.9  PROT 6.7 7.4  ALBUMIN 4.1 3.8   No results for input(s): LIPASE, AMYLASE in the last 168 hours. No results for input(s): AMMONIA in the last 168 hours. Coagulation Profile: Recent Labs  Lab 08/30/17 1346  INR 1.01   Cardiac Enzymes: No results for input(s): CKTOTAL, CKMB, CKMBINDEX, TROPONINI in the last 168 hours. BNP  (last 3 results) No results for input(s): PROBNP in the last 8760 hours. HbA1C: No results for input(s): HGBA1C in the last 72 hours. CBG: No results for input(s): GLUCAP in the last 168 hours. Lipid Profile: No results for input(s): CHOL, HDL, LDLCALC, TRIG, CHOLHDL, LDLDIRECT in the last 72 hours. Thyroid Function Tests: No results for input(s): TSH, T4TOTAL, FREET4, T3FREE, THYROIDAB in the last 72 hours. Anemia Panel: No results for input(s): VITAMINB12, FOLATE, FERRITIN, TIBC, IRON, RETICCTPCT in the last 72 hours. Urine analysis:    Component Value Date/Time   APPEARANCEUR Clear 11/29/2016 0917   GLUCOSEU Negative 11/29/2016 0917   BILIRUBINUR Negative 11/29/2016 0917   PROTEINUR Negative 11/29/2016 0917  UROBILINOGEN negative 05/18/2014 0953   NITRITE Negative 11/29/2016 0917   LEUKOCYTESUR Negative 11/29/2016 0917   Sepsis Labs: @LABRCNTIP (procalcitonin:4,lacticidven:4) )No results found for this or any previous visit (from the past 240 hour(s)).   Radiological Exams on Admission: Dg Chest 2 View  Result Date: 08/29/2017 CLINICAL DATA:  Worsening shortness of breath with exertion EXAM: CHEST - 2 VIEW COMPARISON:  None. FINDINGS: Left Port-A-Cath in place with the tip in the SVC. Slight elevation of the right hemidiaphragm. Heart and mediastinal contours are within normal limits. No focal opacities or effusions. No acute bony abnormality. IMPRESSION: No active cardiopulmonary disease. Electronically Signed   By: Rolm Baptise M.D.   On: 08/29/2017 11:45   Ct Angio Chest Pe W And/or Wo Contrast  Result Date: 08/30/2017 CLINICAL DATA:  Shortness of breath.  History hepatic carcinoma EXAM: CT ANGIOGRAPHY CHEST WITH CONTRAST TECHNIQUE: Multidetector CT imaging of the chest was performed using the standard protocol during bolus administration of intravenous contrast. Multiplanar CT image reconstructions and MIPs were obtained to evaluate the vascular anatomy. CONTRAST:  80 mL  ISOVUE-370 IOPAMIDOL (ISOVUE-370) INJECTION 76% COMPARISON:  PET-CT March 08, 2010 and chest radiograph July 24, 2014 FINDINGS: Cardiovascular: There is extensive pulmonary embolus which extends throughout both main pulmonary arteries as well as extending into multiple upper and lower lobe pulmonary arterial branches. The right ventricle to left ventricle diameter ratio is 1.3, a finding indicative of right heart strain. There is no appreciable thoracic aortic aneurysm or dissection. The visualized great vessels appear normal. No pericardial effusion or pericardial thickening is evident. There are scattered foci of coronary artery calcification. Mediastinum/Nodes: Thyroid appears unremarkable. There are scattered subcentimeter mediastinal lymph nodes. No adenopathy is evident in the thoracic region. No esophageal lesions are appreciable. Lungs/Pleura: There is atelectatic change in both lung bases. There is no appreciable edema or consolidation. No pleural effusion or pleural thickening is evident. Upper Abdomen: Postoperative changes noted along the rightward aspect of the liver. There is scarring in the right upper quadrant of the abdomen in this area. Visualized upper abdominal structures otherwise appear unremarkable. Musculoskeletal: There is degenerative change in the thoracic spine. There are no evident blastic or lytic bone lesions. Review of the MIP images confirms the above findings. IMPRESSION: 1. Extensive pulmonary embolus arising throughout both main pulmonary arteries in a saddle type distribution extension into multiple upper and lower lobe pulmonary arteries. There is evidence of right heart strain. Positive for acute PE with CT evidence of right heart strain (RV/LV Ratio = 1.3) consistent with at least submassive (intermediate risk) PE. The presence of right heart strain has been associated with an increased risk of morbidity and mortality. Please activate Code PE by paging [phone]. 2. No  thoracic aortic aneurysm or dissection. There are scattered foci of coronary artery calcification. 3. Postoperative change in the liver with adjacent scarring in the posterior right upper quadrant region. 4. Areas of mild atelectatic change. No lung edema or consolidation. 5.  No demonstrable adenopathy. Critical Value/emergent results were called by telephone at the time of interpretation on 08/30/2017 at 2:41 pm to Dr. Varney Biles , who verbally acknowledged these results. Electronically Signed   By: Lowella Grip III M.D.   On: 08/30/2017 14:43    EKG: Independently reviewed.  Sinus rhythm with nonspecific ST changes.  Assessment/Plan: Principal Problem:   Acute saddle pulmonary embolism (HCC) Active Problems:   Liver cancer (HCC)   Hyperlipidemia   Hypertension   BPH (benign prostatic hyperplasia)  This patient was discussed with the ED physician, including pertinent vitals, physical exam findings, labs, and imaging.  We also discussed care given by the ED provider.  1. Acute saddle PE a. Stepdown admit b. Troponin pending.  c. Not tPA candidate at this time. d. Heparin e. Did have GI bleed in the past with radiation. No active bleeding now. Will monitor closely. 2. Hypertension a. Continue home meds 3. Hyperlipidemia a. Continue statin 4. Cholangiocarcinoma in remission 5. BPH  DVT prophylaxis: therapeutic on heparin Consultants:  Code Status: Full code Family Communication: wife and daughter in room  Disposition Plan: should be able to return home following admission   Dyneisha Murchison Moores [facility] Pager [phone]  If 7PM-7AM, please contact night-coverage [email or web address] Password [email or web address]

## 2017-08-30 NOTE — Progress Notes (Signed)
CRITICAL VALUE ALERT  Critical Value:  Troponin 0.04  Date & Time Notied:  08/30/17 @1814   Provider Notified: Dr. Nehemiah Settle  Orders Received/Actions taken: no new orders at this time.

## 2017-08-30 NOTE — ED Triage Notes (Signed)
Pt saw PCP yesterday for worsening SOB.  Pt's PCP called and states that his Ddimer was elevated. Pt denies SOB at this moment.

## 2017-08-31 DIAGNOSIS — E7800 Pure hypercholesterolemia, unspecified: Secondary | ICD-10-CM

## 2017-08-31 DIAGNOSIS — N400 Enlarged prostate without lower urinary tract symptoms: Secondary | ICD-10-CM

## 2017-08-31 DIAGNOSIS — I2602 Saddle embolus of pulmonary artery with acute cor pulmonale: Principal | ICD-10-CM

## 2017-08-31 DIAGNOSIS — I10 Essential (primary) hypertension: Secondary | ICD-10-CM

## 2017-08-31 DIAGNOSIS — C221 Intrahepatic bile duct carcinoma: Secondary | ICD-10-CM

## 2017-08-31 LAB — CBC
HEMATOCRIT: 37.1 % — AB (ref 39.0–52.0)
HEMOGLOBIN: 12.1 g/dL — AB (ref 13.0–17.0)
MCH: 29.2 pg (ref 26.0–34.0)
MCHC: 32.6 g/dL (ref 30.0–36.0)
MCV: 89.6 fL (ref 78.0–100.0)
Platelets: 111 10*3/uL — ABNORMAL LOW (ref 150–400)
RBC: 4.14 MIL/uL — ABNORMAL LOW (ref 4.22–5.81)
RDW: 14.5 % (ref 11.5–15.5)
WBC: 6.1 10*3/uL (ref 4.0–10.5)

## 2017-08-31 LAB — BASIC METABOLIC PANEL
ANION GAP: 7 (ref 5–15)
BUN: 16 mg/dL (ref 8–23)
CALCIUM: 8.7 mg/dL — AB (ref 8.9–10.3)
CHLORIDE: 105 mmol/L (ref 98–111)
CO2: 27 mmol/L (ref 22–32)
Creatinine, Ser: 1.09 mg/dL (ref 0.61–1.24)
GFR calc non Af Amer: >60 mL/min (ref >60)
GLUCOSE: 107 mg/dL — AB (ref 70–99)
Potassium: 3.8 mmol/L (ref 3.5–5.1)
Sodium: 139 mmol/L (ref 135–145)

## 2017-08-31 LAB — TROPONIN I: Troponin I: <0.03 ng/mL (ref <0.03)

## 2017-08-31 LAB — HEPARIN LEVEL (UNFRACTIONATED): Heparin Unfractionated: 0.57 IU/mL (ref 0.30–0.70)

## 2017-08-31 LAB — MRSA PCR SCREENING: MRSA by PCR: NEGATIVE

## 2017-08-31 NOTE — Progress Notes (Signed)
ANTICOAGULATION CONSULT NOTE - Initial Consult  Pharmacy Consult for heparin gtt  Indication: pulmonary embolus  No Known Allergies  Patient Measurements: Height: 6' (182.9 cm) Weight: 216 lb 7.9 oz (98.2 kg) IBW/kg (Calculated) : 77.6 Heparin Dosing Weight: 98kg  Vital Signs: Temp: 98.1 F (36.7 C) (07/21 0719) Temp Source: Oral (07/21 0719) BP: 142/83 (07/21 0944) Pulse Rate: 80 (07/21 0719)  Labs: Recent Labs    08/29/17 1153 08/30/17 1308 08/30/17 1346 08/30/17 2209 08/31/17 0428 08/31/17 0429  HGB 13.3  --  13.5  --  12.1*  --   HCT 41.3  --  41.1  --  37.1*  --   PLT 140*  --  129*  --  111*  --   APTT  --   --  28  --   --   --   LABPROT  --   --  13.2  --   --   --   INR  --   --  1.01  --   --   --   HEPARINUNFRC  --   --   --  0.56  --  0.57  CREATININE 1.09  --  1.20  --  1.09  --   TROPONINI  --  0.04*  --  0.03* <0.03  --     Estimated Creatinine Clearance: 75.4 mL/min (by C-G formula based on SCr of 1.09 mg/dL).   Medical History: Past Medical History:  Diagnosis Date  . Asbestos exposure   . Cataract   . Hyperlipidemia   . Hypertension   . Liver cancer ([place])     Medications:  Medications Prior to Admission  Medication Sig Dispense Refill Last Dose  . amLODipine (NORVASC) 10 MG tablet TAKE ONE (1) TABLET EACH DAY 90 tablet 3 08/30/2017 at Unknown time  . atorvastatin (LIPITOR) 40 MG tablet TAKE ONE (1) TABLET EACH DAY (Patient taking differently: Take 40 mg by mouth every morning. TAKE ONE (1) TABLET EACH DAY) 90 tablet 3 08/30/2017 at Unknown time  . cholecalciferol (VITAMIN D) 1000 UNITS tablet Take 1,000 Units by mouth daily.   08/30/2017 at Unknown time  . Glucosamine-Chondroitin (GLUCOSAMINE CHONDR COMPLEX PO) Take 1 tablet by mouth daily.   08/30/2017 at Unknown time  . hydrochlorothiazide (HYDRODIURIL) 25 MG tablet TAKE ONE (1) TABLET EACH DAY 90 tablet 3 08/30/2017 at Unknown time  . pantoprazole (PROTONIX) 40 MG tablet TAKE ONE (1) TABLET  EACH DAY 90 tablet 3 08/30/2017 at Unknown time  . potassium chloride (K-DUR) 10 MEQ tablet TAKE ONE (1) TABLET EACH DAY 90 tablet 3 08/30/2017 at Unknown time   Scheduled:  . amLODipine  10 mg Oral Daily  . atorvastatin  40 mg Oral q1800  . hydrochlorothiazide  25 mg Oral Daily  . pantoprazole  40 mg Oral Daily  . potassium chloride  10 mEq Oral Daily   Infusions:  . heparin 1,550 Units/hr (08/31/17 0404)   PRN:  Anti-infectives (From admission, onward)   None      Assessment: 72 year old male requiring heparin gtt for acute pulmonary embolism. Not on anticoagulants PTA per med rec.   Goal of Therapy:  Heparin level 0.3-0.7 units/ml Monitor platelets by anticoagulation protocol: Yes   Plan:  Give 4000 units bolus x 1 Start heparin infusion at 1550 units/hr Check anti-Xa level in 6 hours and daily while on heparin Continue to monitor H&H and platelets  7/20 HL 0.56, therapeutic. Continue heparin 1550 units/hr will recheck with AM labs  7/21 HL 0.57, therapeutic. Continue heparin 1550 units/hr will recheck with AM labs. CBC stable continue to monitor   Donna Christen Xin [place] 08/31/2017,10:42 AM

## 2017-08-31 NOTE — Progress Notes (Signed)
PROGRESS NOTE    Grant Cummings  TIW:[id number] DOB: 09-04-1945 DOA: 08/30/2017 PCP: Chipper Herb, MD    Brief Narrative:  72 year old male with history of cholangiocarcinoma status post resection/chemo radiation, presents to the hospital with gradual onset of generalized weakness and shortness of breath.  Patient found to have saddle pulmonary embolus on CT angiogram was admitted for intravenous heparin   Assessment & Plan:   Principal Problem:   Acute saddle pulmonary embolism ([place]) Active Problems:   Liver cancer (HCC)   Hyperlipidemia   Hypertension   BPH (benign prostatic hyperplasia)   1. Acute saddle pulmonary embolus.  On intravenous heparin.  Hemodynamically stable.  Continue current treatments.  Will eventually transition to [place] on discharge 2. Acute respiratory failure with hypoxia.  Related to PE.  Will wean off oxygen as tolerated. 3. History of cholangiocarcinoma.  Currently in remission.  Follow-up at [facility] 4. Hyperlipidemia.  Continue statin 5. Hypertension.  Continued on amlodipine and hydrochlorothiazide.  Blood pressure stable.   DVT prophylaxis: Heparin infusion Code Status: Full code Family Communication: Discussed with wife at the bedside Disposition Plan: Discharge home once improved   Consultants:     Procedures:     Antimicrobials:       Subjective: No chest pain.  Denies any shortness of breath, but has been sitting in chair.  He has not gotten up and walked around.  Objective: Vitals:   08/31/17 0300 08/31/17 0400 08/31/17 0447 08/31/17 0719  BP: 111/60 117/75    Pulse: 67 68  80  Resp: 16 15  19   Temp:   (!) 97.5 F (36.4 C) 98.1 F (36.7 C)  TempSrc:   Oral Oral  SpO2: 97% 96%  99%  Weight:   98.2 kg (216 lb 7.9 oz)   Height:        Intake/Output Summary (Last 24 hours) at 08/31/2017 0943 Last data filed at 08/31/2017 0738 Gross per 24 hour  Intake 461.65 ml  Output 900 ml  Net -438.35 ml    Filed Weights   08/30/17 1256 08/30/17 1700 08/31/17 0447  Weight: 100.2 kg (221 lb) 98.8 kg (217 lb 13 oz) 98.2 kg (216 lb 7.9 oz)    Examination:  General exam: Appears calm and comfortable  Respiratory system: Clear to auscultation. Respiratory effort normal. Cardiovascular system: S1 & S2 heard, RRR. No JVD, murmurs, rubs, gallops or clicks. No pedal edema. Gastrointestinal system: Abdomen is nondistended, soft and nontender. No organomegaly or masses felt. Normal bowel sounds heard. Central nervous system: Alert and oriented. No focal neurological deficits. Extremities: Symmetric 5 x 5 power. Skin: No rashes, lesions or ulcers Psychiatry: Judgement and insight appear normal. Mood & affect appropriate.     Data Reviewed: I have personally reviewed following labs and imaging studies  CBC: Recent Labs  Lab 08/29/17 1153 08/30/17 1346 08/31/17 0428  WBC 6.8 7.0 6.1  NEUTROABS 5.4  --   --   HGB 13.3 13.5 12.1*  HCT 41.3 41.1 37.1*  MCV 91 90.1 89.6  PLT 140* 129* 250*   Basic Metabolic Panel: Recent Labs  Lab 08/29/17 1153 08/30/17 1346 08/31/17 0428  NA 141 138 139  K 3.6 3.4* 3.8  CL 101 102 105  CO2 22 26 27   GLUCOSE 110* 97 107*  BUN 17 19 16   CREATININE 1.09 1.20 1.09  CALCIUM 9.7 9.2 8.7*   GFR: Estimated Creatinine Clearance: 75.4 mL/min (by C-G formula based on SCr of 1.09 mg/dL). Liver Function  Tests: Recent Labs  Lab 08/29/17 1153 08/30/17 1346  AST 20 21  ALT 31 28  ALKPHOS 83 71  BILITOT 0.5 0.9  PROT 6.7 7.4  ALBUMIN 4.1 3.8   No results for input(s): LIPASE, AMYLASE in the last 168 hours. No results for input(s): AMMONIA in the last 168 hours. Coagulation Profile: Recent Labs  Lab 08/30/17 1346  INR 1.01   Cardiac Enzymes: Recent Labs  Lab 08/30/17 1308 08/30/17 2209 08/31/17 0428  TROPONINI 0.04* 0.03* <0.03   BNP (last 3 results) No results for input(s): PROBNP in the last 8760 hours. HbA1C: No results for input(s):  HGBA1C in the last 72 hours. CBG: No results for input(s): GLUCAP in the last 168 hours. Lipid Profile: No results for input(s): CHOL, HDL, LDLCALC, TRIG, CHOLHDL, LDLDIRECT in the last 72 hours. Thyroid Function Tests: No results for input(s): TSH, T4TOTAL, FREET4, T3FREE, THYROIDAB in the last 72 hours. Anemia Panel: No results for input(s): VITAMINB12, FOLATE, FERRITIN, TIBC, IRON, RETICCTPCT in the last 72 hours. Sepsis Labs: No results for input(s): PROCALCITON, LATICACIDVEN in the last 168 hours.  Recent Results (from the past 240 hour(s))  MRSA PCR Screening     Status: None   Collection Time: 08/30/17  4:51 PM  Result Value Ref Range Status   MRSA by PCR NEGATIVE NEGATIVE Final    Comment:        The GeneXpert MRSA Assay (FDA approved for NASAL specimens only), is one component of a comprehensive MRSA colonization surveillance program. It is not intended to diagnose MRSA infection nor to guide or monitor treatment for MRSA infections. Performed at [facility], [address], [place], [place] [address]          Radiology Studies: Dg Chest 2 View  Result Date: 08/29/2017 CLINICAL DATA:  Worsening shortness of breath with exertion EXAM: CHEST - 2 VIEW COMPARISON:  None. FINDINGS: Left Port-A-Cath in place with the tip in the SVC. Slight elevation of the right hemidiaphragm. Heart and mediastinal contours are within normal limits. No focal opacities or effusions. No acute bony abnormality. IMPRESSION: No active cardiopulmonary disease. Electronically Signed   By: Rolm Baptise M.D.   On: 08/29/2017 11:45   Ct Angio Chest Pe W And/or Wo Contrast  Result Date: 08/30/2017 CLINICAL DATA:  Shortness of breath.  History hepatic carcinoma EXAM: CT ANGIOGRAPHY CHEST WITH CONTRAST TECHNIQUE: Multidetector CT imaging of the chest was performed using the standard protocol during bolus administration of intravenous contrast. Multiplanar CT image reconstructions and MIPs were  obtained to evaluate the vascular anatomy. CONTRAST:  80 mL ISOVUE-370 IOPAMIDOL (ISOVUE-370) INJECTION 76% COMPARISON:  PET-CT March 08, 2010 and chest radiograph July 24, 2014 FINDINGS: Cardiovascular: There is extensive pulmonary embolus which extends throughout both main pulmonary arteries as well as extending into multiple upper and lower lobe pulmonary arterial branches. The right ventricle to left ventricle diameter ratio is 1.3, a finding indicative of right heart strain. There is no appreciable thoracic aortic aneurysm or dissection. The visualized great vessels appear normal. No pericardial effusion or pericardial thickening is evident. There are scattered foci of coronary artery calcification. Mediastinum/Nodes: Thyroid appears unremarkable. There are scattered subcentimeter mediastinal lymph nodes. No adenopathy is evident in the thoracic region. No esophageal lesions are appreciable. Lungs/Pleura: There is atelectatic change in both lung bases. There is no appreciable edema or consolidation. No pleural effusion or pleural thickening is evident. Upper Abdomen: Postoperative changes noted along the rightward aspect of the liver. There is scarring in  the right upper quadrant of the abdomen in this area. Visualized upper abdominal structures otherwise appear unremarkable. Musculoskeletal: There is degenerative change in the thoracic spine. There are no evident blastic or lytic bone lesions. Review of the MIP images confirms the above findings. IMPRESSION: 1. Extensive pulmonary embolus arising throughout both main pulmonary arteries in a saddle type distribution extension into multiple upper and lower lobe pulmonary arteries. There is evidence of right heart strain. Positive for acute PE with CT evidence of right heart strain (RV/LV Ratio = 1.3) consistent with at least submassive (intermediate risk) PE. The presence of right heart strain has been associated with an increased risk of morbidity and  mortality. Please activate Code PE by paging [phone]. 2. No thoracic aortic aneurysm or dissection. There are scattered foci of coronary artery calcification. 3. Postoperative change in the liver with adjacent scarring in the posterior right upper quadrant region. 4. Areas of mild atelectatic change. No lung edema or consolidation. 5.  No demonstrable adenopathy. Critical Value/emergent results were called by telephone at the time of interpretation on 08/30/2017 at 2:41 pm to Dr. Varney Biles , who verbally acknowledged these results. Electronically Signed   By: Lowella Grip III M.D.   On: 08/30/2017 14:43        Scheduled Meds: . amLODipine  10 mg Oral Daily  . atorvastatin  40 mg Oral q1800  . hydrochlorothiazide  25 mg Oral Daily  . pantoprazole  40 mg Oral Daily  . potassium chloride  10 mEq Oral Daily   Continuous Infusions: . heparin 1,550 Units/hr (08/31/17 0404)     LOS: 1 day    Time spent: 35 minutes.  Greater than 50% of this time spent in direct contact with patient discussing management of pulmonary embolus, oncology follow-up, risks and benefits of anticoagulation    Kathie Dike, MD [facility] Pager [phone]  If 7PM-7AM, please contact night-coverage [email or web address] Password [facility] 08/31/2017, 9:43 AM

## 2017-09-01 ENCOUNTER — Inpatient Hospital Stay (HOSPITAL_COMMUNITY): Payer: PPO

## 2017-09-01 DIAGNOSIS — I361 Nonrheumatic tricuspid (valve) insufficiency: Secondary | ICD-10-CM

## 2017-09-01 LAB — CBC
HCT: 39.2 % (ref 39.0–52.0)
HEMOGLOBIN: 12.6 g/dL — AB (ref 13.0–17.0)
MCH: 29.2 pg (ref 26.0–34.0)
MCHC: 32.1 g/dL (ref 30.0–36.0)
MCV: 90.7 fL (ref 78.0–100.0)
Platelets: 125 10*3/uL — ABNORMAL LOW (ref 150–400)
RBC: 4.32 MIL/uL (ref 4.22–5.81)
RDW: 14.4 % (ref 11.5–15.5)
WBC: 7.1 10*3/uL (ref 4.0–10.5)

## 2017-09-01 LAB — ECHOCARDIOGRAM COMPLETE
Height: 72 in
Weight: 3463.87 oz

## 2017-09-01 LAB — HEPARIN LEVEL (UNFRACTIONATED): Heparin Unfractionated: 0.69 IU/mL (ref 0.30–0.70)

## 2017-09-01 NOTE — Progress Notes (Signed)
PROGRESS NOTE    Grant Cummings  JSE:[id number] DOB: 16-Oct-1945 DOA: 08/30/2017 PCP: Chipper Herb, MD    Brief Narrative:  72 year old male with history of cholangiocarcinoma status post resection/chemo radiation, presents to the hospital with gradual onset of generalized weakness and shortness of breath.  Patient found to have saddle pulmonary embolus on CT angiogram was admitted for intravenous heparin   Assessment & Plan:   Principal Problem:   Acute saddle pulmonary embolism ([place]) Active Problems:   Liver cancer (HCC)   Hyperlipidemia   Hypertension   BPH (benign prostatic hyperplasia)   1. Acute saddle pulmonary embolus.  On intravenous heparin and will need continued close monitoring.  Hemodynamically stable.  Continue current treatments.  Will eventually transition to [place] on discharge 2. Acute respiratory failure with hypoxia.  Related to PE.  Will wean off oxygen as tolerated. Will request physical therapy evaluation 3. History of cholangiocarcinoma.  Currently in remission.  Follow-up at [facility] 4. Hyperlipidemia.  Continue statin 5. Hypertension.  Continued on amlodipine and hydrochlorothiazide.  Blood pressure stable.   DVT prophylaxis: Heparin infusion Code Status: Full code Family Communication: Discussed with wife at the bedside Disposition Plan: Discharge home once improved   Consultants:     Procedures:     Antimicrobials:       Subjective: No chest pain or shortness of breath. He has not really exerted himself  Objective: Vitals:   09/01/17 0500 09/01/17 0600 09/01/17 0700 09/01/17 0800  BP:  (!) 100/59    Pulse: 64 67 66   Resp: 15 15 14    Temp:    98.6 F (37 C)  TempSrc:    Oral  SpO2: 98% 97% 95%   Weight:      Height:        Intake/Output Summary (Last 24 hours) at 09/01/2017 0949 Last data filed at 09/01/2017 0700 Gross per 24 hour  Intake 387.5 ml  Output 1500 ml  Net -1112.5 ml   Filed Weights   08/30/17 1256 08/30/17 1700 08/31/17 0447  Weight: 100.2 kg (221 lb) 98.8 kg (217 lb 13 oz) 98.2 kg (216 lb 7.9 oz)    Examination:  General exam: Alert, awake, oriented x 3 Respiratory system: Clear to auscultation. Respiratory effort normal. Cardiovascular system:RRR. No murmurs, rubs, gallops. Gastrointestinal system: Abdomen is nondistended, soft and nontender. No organomegaly or masses felt. Normal bowel sounds heard. Central nervous system: Alert and oriented. No focal neurological deficits. Extremities: No C/C/E, +pedal pulses Skin: No rashes, lesions or ulcers Psychiatry: Judgement and insight appear normal. Mood & affect appropriate.    Data Reviewed: I have personally reviewed following labs and imaging studies  CBC: Recent Labs  Lab 08/29/17 1153 08/30/17 1346 08/31/17 0428 09/01/17 0359  WBC 6.8 7.0 6.1 7.1  NEUTROABS 5.4  --   --   --   HGB 13.3 13.5 12.1* 12.6*  HCT 41.3 41.1 37.1* 39.2  MCV 91 90.1 89.6 90.7  PLT 140* 129* 111* 073*   Basic Metabolic Panel: Recent Labs  Lab 08/29/17 1153 08/30/17 1346 08/31/17 0428  NA 141 138 139  K 3.6 3.4* 3.8  CL 101 102 105  CO2 22 26 27   GLUCOSE 110* 97 107*  BUN 17 19 16   CREATININE 1.09 1.20 1.09  CALCIUM 9.7 9.2 8.7*   GFR: Estimated Creatinine Clearance: 75.4 mL/min (by C-G formula based on SCr of 1.09 mg/dL). Liver Function Tests: Recent Labs  Lab 08/29/17 1153 08/30/17 1346  AST 20  21  ALT 31 28  ALKPHOS 83 71  BILITOT 0.5 0.9  PROT 6.7 7.4  ALBUMIN 4.1 3.8   No results for input(s): LIPASE, AMYLASE in the last 168 hours. No results for input(s): AMMONIA in the last 168 hours. Coagulation Profile: Recent Labs  Lab 08/30/17 1346  INR 1.01   Cardiac Enzymes: Recent Labs  Lab 08/30/17 1308 08/30/17 2209 08/31/17 0428  TROPONINI 0.04* 0.03* <0.03   BNP (last 3 results) No results for input(s): PROBNP in the last 8760 hours. HbA1C: No results for input(s): HGBA1C in the last 72  hours. CBG: No results for input(s): GLUCAP in the last 168 hours. Lipid Profile: No results for input(s): CHOL, HDL, LDLCALC, TRIG, CHOLHDL, LDLDIRECT in the last 72 hours. Thyroid Function Tests: No results for input(s): TSH, T4TOTAL, FREET4, T3FREE, THYROIDAB in the last 72 hours. Anemia Panel: No results for input(s): VITAMINB12, FOLATE, FERRITIN, TIBC, IRON, RETICCTPCT in the last 72 hours. Sepsis Labs: No results for input(s): PROCALCITON, LATICACIDVEN in the last 168 hours.  Recent Results (from the past 240 hour(s))  MRSA PCR Screening     Status: None   Collection Time: 08/30/17  4:51 PM  Result Value Ref Range Status   MRSA by PCR NEGATIVE NEGATIVE Final    Comment:        The GeneXpert MRSA Assay (FDA approved for NASAL specimens only), is one component of a comprehensive MRSA colonization surveillance program. It is not intended to diagnose MRSA infection nor to guide or monitor treatment for MRSA infections. Performed at [facility], [address] [place], [place] [address]          Radiology Studies: Ct Angio Chest Pe W And/or Wo Contrast  Result Date: 08/30/2017 CLINICAL DATA:  Shortness of breath.  History hepatic carcinoma EXAM: CT ANGIOGRAPHY CHEST WITH CONTRAST TECHNIQUE: Multidetector CT imaging of the chest was performed using the standard protocol during bolus administration of intravenous contrast. Multiplanar CT image reconstructions and MIPs were obtained to evaluate the vascular anatomy. CONTRAST:  80 mL ISOVUE-370 IOPAMIDOL (ISOVUE-370) INJECTION 76% COMPARISON:  PET-CT March 08, 2010 and chest radiograph July 24, 2014 FINDINGS: Cardiovascular: There is extensive pulmonary embolus which extends throughout both main pulmonary arteries as well as extending into multiple upper and lower lobe pulmonary arterial branches. The right ventricle to left ventricle diameter ratio is 1.3, a finding indicative of right heart strain. There is no appreciable  thoracic aortic aneurysm or dissection. The visualized great vessels appear normal. No pericardial effusion or pericardial thickening is evident. There are scattered foci of coronary artery calcification. Mediastinum/Nodes: Thyroid appears unremarkable. There are scattered subcentimeter mediastinal lymph nodes. No adenopathy is evident in the thoracic region. No esophageal lesions are appreciable. Lungs/Pleura: There is atelectatic change in both lung bases. There is no appreciable edema or consolidation. No pleural effusion or pleural thickening is evident. Upper Abdomen: Postoperative changes noted along the rightward aspect of the liver. There is scarring in the right upper quadrant of the abdomen in this area. Visualized upper abdominal structures otherwise appear unremarkable. Musculoskeletal: There is degenerative change in the thoracic spine. There are no evident blastic or lytic bone lesions. Review of the MIP images confirms the above findings. IMPRESSION: 1. Extensive pulmonary embolus arising throughout both main pulmonary arteries in a saddle type distribution extension into multiple upper and lower lobe pulmonary arteries. There is evidence of right heart strain. Positive for acute PE with CT evidence of right heart strain (RV/LV Ratio = 1.3) consistent  with at least submassive (intermediate risk) PE. The presence of right heart strain has been associated with an increased risk of morbidity and mortality. Please activate Code PE by paging [phone]. 2. No thoracic aortic aneurysm or dissection. There are scattered foci of coronary artery calcification. 3. Postoperative change in the liver with adjacent scarring in the posterior right upper quadrant region. 4. Areas of mild atelectatic change. No lung edema or consolidation. 5.  No demonstrable adenopathy. Critical Value/emergent results were called by telephone at the time of interpretation on 08/30/2017 at 2:41 pm to Dr. Varney Biles , who verbally  acknowledged these results. Electronically Signed   By: Lowella Grip III M.D.   On: 08/30/2017 14:43        Scheduled Meds: . amLODipine  10 mg Oral Daily  . atorvastatin  40 mg Oral q1800  . hydrochlorothiazide  25 mg Oral Daily  . pantoprazole  40 mg Oral Daily  . potassium chloride  10 mEq Oral Daily   Continuous Infusions: . heparin 1,550 Units/hr (08/31/17 2120)     LOS: 2 days    Time spent: 3mins    Kathie Dike, MD [facility] Pager [phone]  If 7PM-7AM, please contact night-coverage [email or web address] Password [facility] 09/01/2017, 9:49 AM

## 2017-09-01 NOTE — Progress Notes (Signed)
*  PRELIMINARY RESULTS* Echocardiogram 2D Echocardiogram has been performed.  Leavy Cella 09/01/2017, 9:32 AM

## 2017-09-01 NOTE — Care Management Important Message (Signed)
Important Message  Patient Details  Name: Grant Cummings MRN: [id number] Date of Birth: 02-21-1945   Medicare Important Message Given:  Yes    Shelda Altes 09/01/2017, 12:44 PM

## 2017-09-01 NOTE — Progress Notes (Signed)
ANTICOAGULATION CONSULT NOTE - Initial Consult  Pharmacy Consult for heparin gtt  Indication:  Saddle pulmonary PE  No Known Allergies  Patient Measurements: Height: 6' (182.9 cm) Weight: 216 lb 7.9 oz (98.2 kg) IBW/kg (Calculated) : 77.6 Heparin Dosing Weight: 98kg  Vital Signs: Temp: 98.6 F (37 C) (07/22 0800) Temp Source: Oral (07/22 0800) BP: 137/91 (07/22 1001) Pulse Rate: 66 (07/22 0700)  Labs: Recent Labs    08/29/17 1153 08/30/17 1308  08/30/17 1346 08/30/17 2209 08/31/17 0428 08/31/17 0429 09/01/17 0358 09/01/17 0359  HGB 13.3  --    < > 13.5  --  12.1*  --   --  12.6*  HCT 41.3  --   --  41.1  --  37.1*  --   --  39.2  PLT 140*  --   --  129*  --  111*  --   --  125*  APTT  --   --   --  28  --   --   --   --   --   LABPROT  --   --   --  13.2  --   --   --   --   --   INR  --   --   --  1.01  --   --   --   --   --   HEPARINUNFRC  --   --   --   --  0.56  --  0.57 0.69  --   CREATININE 1.09  --   --  1.20  --  1.09  --   --   --   TROPONINI  --  0.04*  --   --  0.03* <0.03  --   --   --    < > = values in this interval not displayed.    Estimated Creatinine Clearance: 75.4 mL/min (by C-G formula based on SCr of 1.09 mg/dL).   Assessment: 72 year old male requiring heparin gtt for acute pulmonary embolism. Not on anticoagulants PTA per med rec. Plan is to start [place] when appropriate.  Goal of Therapy:  Heparin level 0.3-0.7 units/ml Monitor platelets by anticoagulation protocol: Yes   Plan:  Start heparin infusion at 1550 units/hr Check anti-Xa  daily while on heparin Continue to monitor H&H and platelets  7/20 HL 0.56, therapeutic. Continue heparin 1550 units/hr will recheck with AM labs   7/21 HL 0.57, therapeutic. Continue heparin 1550 units/hr will recheck with AM labs. CBC stable continue to monitor   7/22  HL 0.69, therapeutic, therapeutic. Continue heparin 1550 units/hr will recheck with AM labs. CBC stable, continue to monitor  .  Despina Pole, Pharm. D. Clinical Pharmacist 09/01/2017 11:45 AM

## 2017-09-02 LAB — HEPARIN LEVEL (UNFRACTIONATED): Heparin Unfractionated: 0.68 IU/mL (ref 0.30–0.70)

## 2017-09-02 MED ORDER — APIXABAN 5 MG PO TABS
10.0000 mg | ORAL_TABLET | Freq: Two times a day (BID) | ORAL | Status: DC
  Administered 2017-09-02: 10 mg via ORAL
  Filled 2017-09-02: qty 2

## 2017-09-02 MED ORDER — APIXABAN 5 MG PO TABS: 5.0000 mg | ORAL_TABLET | Freq: Two times a day (BID) | ORAL | Status: DC

## 2017-09-02 MED ORDER — APIXABAN 5 MG PO TABS: ORAL_TABLET | ORAL | 1 refills | Status: DC

## 2017-09-02 NOTE — Discharge Instructions (Signed)

## 2017-09-02 NOTE — Progress Notes (Signed)
Patient has PT consult in place for imminent discharge per Dr. Roderic Palau. Nursing notified Jeneen Rinks with PT. States he is aware of consult and will be back to see patient. Notified Dr. Roderic Palau. Donavan Foil, RN

## 2017-09-02 NOTE — Discharge Summary (Signed)
Physician Discharge Summary  Grant Cummings QVZ:[id number] DOB: 22-Jun-1945 DOA: 08/30/2017  PCP: Chipper Herb, MD  Admit date: 08/30/2017 Discharge date: 09/02/2017  Admitted From: Home Disposition: Home  Recommendations for Outpatient Follow-up:  1. Follow up with PCP in 1-2 weeks 2. Please obtain BMP/CBC in one week 3. Follow-up with primary hematologist/oncologist at [facility] in the next few weeks.  Discharge Condition: Stable CODE STATUS: Full code Diet recommendation: Heart healthy  Brief/Interim Summary: 72 year old male with history of cholangiocarcinoma status post resection/chemo radiation now in remission, presents to the hospital with gradual onset of generalized weakness and shortness of breath.  CT of the angiogram of the chest was performed and he was found to have saddle pulmonary embolus.  He was admitted for further treatment of intravenous heparin.  Case was initially discussed with pulmonology and since he was hemodynamically stable, and was also stable from a respiratory standpoint, it was not felt that thrombolytics were indicated at this time.  Patient was treated with intravenous anticoagulation.  His respiratory status has gradually improved and now he is breathing comfortably on room air.  Echocardiogram was performed that showed RV function was stable.  He is able to ambulate without any shortness of breath.  He has been transitioned to oral Eliquis.  He will follow-up with his oncologist to determine length of therapy.  The remainder of his medical problems have been stable.  Discharge Diagnoses:  Principal Problem:   Acute saddle pulmonary embolism (HCC) Active Problems:   Liver cancer (HCC)   Hyperlipidemia   Hypertension   BPH (benign prostatic hyperplasia)    Discharge Instructions  Discharge Instructions    Diet - low sodium heart healthy   Complete by:  As directed    Increase activity slowly   Complete by:  As directed       Allergies as of 09/02/2017   No Known Allergies     Medication List    TAKE these medications   amLODipine 10 MG tablet Commonly known as:  NORVASC TAKE ONE (1) TABLET EACH DAY   apixaban 5 MG Tabs tablet Commonly known as:  ELIQUIS Take 10mg  po bid for 7 days then 5mg  po bid   atorvastatin 40 MG tablet Commonly known as:  LIPITOR TAKE ONE (1) TABLET EACH DAY What changed:    how much to take  how to take this  when to take this  additional instructions   cholecalciferol 1000 units tablet Commonly known as:  VITAMIN D Take 1,000 Units by mouth daily.   GLUCOSAMINE CHONDR COMPLEX PO Take 1 tablet by mouth daily.   hydrochlorothiazide 25 MG tablet Commonly known as:  HYDRODIURIL TAKE ONE (1) TABLET EACH DAY   pantoprazole 40 MG tablet Commonly known as:  PROTONIX TAKE ONE (1) TABLET EACH DAY   potassium chloride 10 MEQ tablet Commonly known as:  K-DUR TAKE ONE (1) TABLET EACH DAY       No Known Allergies  Consultations:     Procedures/Studies: Dg Chest 2 View  Result Date: 08/29/2017 CLINICAL DATA:  Worsening shortness of breath with exertion EXAM: CHEST - 2 VIEW COMPARISON:  None. FINDINGS: Left Port-A-Cath in place with the tip in the SVC. Slight elevation of the right hemidiaphragm. Heart and mediastinal contours are within normal limits. No focal opacities or effusions. No acute bony abnormality. IMPRESSION: No active cardiopulmonary disease. Electronically Signed   By: Rolm Baptise M.D.   On: 08/29/2017 11:45   Ct Angio Chest Pe  W And/or Wo Contrast  Result Date: 08/30/2017 CLINICAL DATA:  Shortness of breath.  History hepatic carcinoma EXAM: CT ANGIOGRAPHY CHEST WITH CONTRAST TECHNIQUE: Multidetector CT imaging of the chest was performed using the standard protocol during bolus administration of intravenous contrast. Multiplanar CT image reconstructions and MIPs were obtained to evaluate the vascular anatomy. CONTRAST:  80 mL ISOVUE-370 IOPAMIDOL  (ISOVUE-370) INJECTION 76% COMPARISON:  PET-CT March 08, 2010 and chest radiograph July 24, 2014 FINDINGS: Cardiovascular: There is extensive pulmonary embolus which extends throughout both main pulmonary arteries as well as extending into multiple upper and lower lobe pulmonary arterial branches. The right ventricle to left ventricle diameter ratio is 1.3, a finding indicative of right heart strain. There is no appreciable thoracic aortic aneurysm or dissection. The visualized great vessels appear normal. No pericardial effusion or pericardial thickening is evident. There are scattered foci of coronary artery calcification. Mediastinum/Nodes: Thyroid appears unremarkable. There are scattered subcentimeter mediastinal lymph nodes. No adenopathy is evident in the thoracic region. No esophageal lesions are appreciable. Lungs/Pleura: There is atelectatic change in both lung bases. There is no appreciable edema or consolidation. No pleural effusion or pleural thickening is evident. Upper Abdomen: Postoperative changes noted along the rightward aspect of the liver. There is scarring in the right upper quadrant of the abdomen in this area. Visualized upper abdominal structures otherwise appear unremarkable. Musculoskeletal: There is degenerative change in the thoracic spine. There are no evident blastic or lytic bone lesions. Review of the MIP images confirms the above findings. IMPRESSION: 1. Extensive pulmonary embolus arising throughout both main pulmonary arteries in a saddle type distribution extension into multiple upper and lower lobe pulmonary arteries. There is evidence of right heart strain. Positive for acute PE with CT evidence of right heart strain (RV/LV Ratio = 1.3) consistent with at least submassive (intermediate risk) PE. The presence of right heart strain has been associated with an increased risk of morbidity and mortality. Please activate Code PE by paging [phone]. 2. No thoracic aortic  aneurysm or dissection. There are scattered foci of coronary artery calcification. 3. Postoperative change in the liver with adjacent scarring in the posterior right upper quadrant region. 4. Areas of mild atelectatic change. No lung edema or consolidation. 5.  No demonstrable adenopathy. Critical Value/emergent results were called by telephone at the time of interpretation on 08/30/2017 at 2:41 pm to Dr. Varney Biles , who verbally acknowledged these results. Electronically Signed   By: Lowella Grip III M.D.   On: 08/30/2017 14:43    Echo: - Left ventricle: The cavity size was normal. Wall thickness was   increased in a pattern of mild LVH. Systolic function was normal.   The estimated ejection fraction was in the range of 60% to 65%.   Wall motion was normal; there were no regional wall motion   abnormalities. Indeterminate diastolic function. - Aortic valve: Mildly calcified annulus. Trileaflet. - Mitral valve: There was trivial regurgitation. - Right ventricle: The cavity size was mildly dilated. Systolic   function was normal. - Right atrium: Central venous pressure (est): 3 mm Hg. - Atrial septum: No defect or patent foramen ovale was identified. - Tricuspid valve: There was mild regurgitation. - Pulmonary arteries: Systolic pressure could not be accurately   estimated. - Pericardium, extracardiac: There was no pericardial effusion.   Subjective: Shortness of breath is better.  No chest pain.  Discharge Exam: Vitals:   09/02/17 0041 09/02/17 0636  BP: (!) 140/92 130/84  Pulse: 81 76  Resp:  19 18  Temp: 98.4 F (36.9 C) 98.6 F (37 C)  SpO2: 96% 95%   Vitals:   09/01/17 2300 09/02/17 0000 09/02/17 0041 09/02/17 0636  BP: 132/81 126/80 (!) 140/92 130/84  Pulse: 75 77 81 76  Resp: 16 16 19 18   Temp: 98.3 F (36.8 C)  98.4 F (36.9 C) 98.6 F (37 C)  TempSrc: Oral  Oral Oral  SpO2: 93% 94% 96% 95%  Weight:   98.4 kg (216 lb 14.9 oz)   Height:        General:  Pt is alert, awake, not in acute distress Cardiovascular: RRR, S1/S2 +, no rubs, no gallops Respiratory: CTA bilaterally, no wheezing, no rhonchi Abdominal: Soft, NT, ND, bowel sounds + Extremities: no edema, no cyanosis    The results of significant diagnostics from this hospitalization (including imaging, microbiology, ancillary and laboratory) are listed below for reference.     Microbiology: Recent Results (from the past 240 hour(s))  MRSA PCR Screening     Status: None   Collection Time: 08/30/17  4:51 PM  Result Value Ref Range Status   MRSA by PCR NEGATIVE NEGATIVE Final    Comment:        The GeneXpert MRSA Assay (FDA approved for NASAL specimens only), is one component of a comprehensive MRSA colonization surveillance program. It is not intended to diagnose MRSA infection nor to guide or monitor treatment for MRSA infections. Performed at [facility], [address], [place], [place] [address]      Labs: BNP (last 3 results) Recent Labs    08/29/17 1153  BNP 49.8   Basic Metabolic Panel: Recent Labs  Lab 08/29/17 1153 08/30/17 1346 08/31/17 0428  NA 141 138 139  K 3.6 3.4* 3.8  CL 101 102 105  CO2 22 26 27   GLUCOSE 110* 97 107*  BUN 17 19 16   CREATININE 1.09 1.20 1.09  CALCIUM 9.7 9.2 8.7*   Liver Function Tests: Recent Labs  Lab 08/29/17 1153 08/30/17 1346  AST 20 21  ALT 31 28  ALKPHOS 83 71  BILITOT 0.5 0.9  PROT 6.7 7.4  ALBUMIN 4.1 3.8   No results for input(s): LIPASE, AMYLASE in the last 168 hours. No results for input(s): AMMONIA in the last 168 hours. CBC: Recent Labs  Lab 08/29/17 1153 08/30/17 1346 08/31/17 0428 09/01/17 0359  WBC 6.8 7.0 6.1 7.1  NEUTROABS 5.4  --   --   --   HGB 13.3 13.5 12.1* 12.6*  HCT 41.3 41.1 37.1* 39.2  MCV 91 90.1 89.6 90.7  PLT 140* 129* 111* 125*   Cardiac Enzymes: Recent Labs  Lab 08/30/17 1308 08/30/17 2209 08/31/17 0428  TROPONINI 0.04* 0.03* <0.03   BNP: Invalid input(s):  POCBNP CBG: No results for input(s): GLUCAP in the last 168 hours. D-Dimer No results for input(s): DDIMER in the last 72 hours. Hgb A1c No results for input(s): HGBA1C in the last 72 hours. Lipid Profile No results for input(s): CHOL, HDL, LDLCALC, TRIG, CHOLHDL, LDLDIRECT in the last 72 hours. Thyroid function studies No results for input(s): TSH, T4TOTAL, T3FREE, THYROIDAB in the last 72 hours.  Invalid input(s): FREET3 Anemia work up No results for input(s): VITAMINB12, FOLATE, FERRITIN, TIBC, IRON, RETICCTPCT in the last 72 hours. Urinalysis    Component Value Date/Time   APPEARANCEUR Clear 11/29/2016 0917   GLUCOSEU Negative 11/29/2016 0917   BILIRUBINUR Negative 11/29/2016 0917   PROTEINUR Negative 11/29/2016 0917   UROBILINOGEN negative 05/18/2014 0953  NITRITE Negative 11/29/2016 0917   LEUKOCYTESUR Negative 11/29/2016 0917   Sepsis Labs Invalid input(s): PROCALCITONIN,  WBC,  LACTICIDVEN Microbiology Recent Results (from the past 240 hour(s))  MRSA PCR Screening     Status: None   Collection Time: 08/30/17  4:51 PM  Result Value Ref Range Status   MRSA by PCR NEGATIVE NEGATIVE Final    Comment:        The GeneXpert MRSA Assay (FDA approved for NASAL specimens only), is one component of a comprehensive MRSA colonization surveillance program. It is not intended to diagnose MRSA infection nor to guide or monitor treatment for MRSA infections. Performed at [facility], [address], [place], [place] [address]      Time coordinating discharge: 38mins  SIGNED:   Kathie Dike, MD  [facility] 09/02/2017, 2:12 PM Pager   If 7PM-7AM, please contact night-coverage [email or web address] Password [email or web address]

## 2017-09-02 NOTE — Progress Notes (Signed)
Pt transferred to 300 unit.

## 2017-09-02 NOTE — Progress Notes (Signed)
Notified pharmacy of need for heparin level for this am. Despina Pole, Pharmacist stated will order for this morning to add to morning labs. Donavan Foil, RN

## 2017-09-02 NOTE — Progress Notes (Signed)
So-Hi for heparin gtt  Indication:  PE  No Known Allergies  Patient Measurements: Height: 6' (182.9 cm) Weight: 216 lb 14.9 oz (98.4 kg) IBW/kg (Calculated) : 77.6 Heparin Dosing Weight: 98kg  Vital Signs: Temp: 98.6 F (37 C) (07/23 0636) Temp Source: Oral (07/23 0636) BP: 130/84 (07/23 0636) Pulse Rate: 76 (07/23 0636)  Labs: Recent Labs    08/30/17 1308  08/30/17 1346  08/30/17 2209 08/31/17 0428 08/31/17 0429 09/01/17 0358 09/01/17 0359 09/02/17 0808  HGB  --    < > 13.5  --   --  12.1*  --   --  12.6*  --   HCT  --   --  41.1  --   --  37.1*  --   --  39.2  --   PLT  --   --  129*  --   --  111*  --   --  125*  --   APTT  --   --  28  --   --   --   --   --   --   --   LABPROT  --   --  13.2  --   --   --   --   --   --   --   INR  --   --  1.01  --   --   --   --   --   --   --   HEPARINUNFRC  --   --   --    < > 0.56  --  0.57 0.69  --  0.68  CREATININE  --   --  1.20  --   --  1.09  --   --   --   --   TROPONINI 0.04*  --   --   --  0.03* <0.03  --   --   --   --    < > = values in this interval not displayed.    Estimated Creatinine Clearance: 75.5 mL/min (by C-G formula based on SCr of 1.09 mg/dL).   Assessment: 72 year old male requiring heparin gtt for acute pulmonary embolism. Not on anticoagulants PTA per med rec. Therapeutic this morning at 0.68. Plan is to start [place] when appropriate.  Goal of Therapy:  Heparin level 0.3-0.7 units/ml Monitor platelets by anticoagulation protocol: Yes   Plan:  Continue heparin infusion at 1550 units/hr Check anti-Xa  daily while on heparin Continue to monitor H&H and platelets  Margot Ables, PharmD Clinical Pharmacist 09/02/2017 8:39 AM

## 2017-09-02 NOTE — Evaluation (Signed)
Physical Therapy Evaluation Patient Details Name: Grant Cummings MRN: [id number] DOB: 09/27/1945 Today's Date: 09/02/2017   History of Present Illness  Grant Cummings is a 72 y.o. 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  Patient seen for shortness of breath over the past 2 to 3 weeks that is been gradual in onset and has continued to get worse.  Dyspnea worse with exertion and improved with rest.  Denies cough, chest pain, chest pressure.  Patient was seen yesterday at PCPs office when ambulating short distances was causing him to become short of breath.  A d-dimer was obtained, which was elevated to 13.  Patient was sent here for evaluation.  No other palliating or provoking factors.    Clinical Impression  Patient functioning at baseline for functional mobility and gait.  Plan:  Patient discharged from physical therapy to care of nursing for ambulation daily as tolerated for length of stay.     Follow Up Recommendations No PT follow up    Equipment Recommendations  None recommended by PT    Recommendations for Other Services       Precautions / Restrictions Precautions Precautions: None Restrictions Weight Bearing Restrictions: No      Mobility  Bed Mobility Overal bed mobility: Independent                Transfers Overall transfer level: Independent                  Ambulation/Gait Ambulation/Gait assistance: Modified independent (Device/Increase time) Gait Distance (Feet): 200 Feet Assistive device: None Gait Pattern/deviations: WFL(Within Functional Limits) Gait velocity: near normal   General Gait Details: grossly WFL except decreased armswing due to IV in LUE  Stairs            Wheelchair Mobility    Modified Rankin (Stroke Patients Only)       Balance Overall balance assessment: No apparent balance deficits (not formally assessed)                                           Pertinent Vitals/Pain Pain Assessment: No/denies pain    Home Living Family/patient expects to be discharged to:: Private residence Living Arrangements: Spouse/significant other Available Help at Discharge: Family Type of Home: House Home Access: Stairs to enter   Technical brewer of Steps: 1 Home Layout: One level Home Equipment: Environmental consultant - 2 wheels;Cane - single point;Bedside commode;Shower seat      Prior Function Level of Independence: Independent         Comments: community ambulator, drives     Journalist, newspaper        Extremity/Trunk Assessment   Upper Extremity Assessment Upper Extremity Assessment: Overall WFL for tasks assessed    Lower Extremity Assessment Lower Extremity Assessment: Overall WFL for tasks assessed    Cervical / Trunk Assessment Cervical / Trunk Assessment: Normal  Communication   Communication: No difficulties  Cognition Arousal/Alertness: Awake/alert Behavior During Therapy: WFL for tasks assessed/performed Overall Cognitive Status: Within Functional Limits for tasks assessed                                        General Comments      Exercises     Assessment/Plan  PT Assessment Patent does not need any further PT services  PT Problem List         PT Treatment Interventions      PT Goals (Current goals can be found in the Care Plan section)  Acute Rehab PT Goals Patient Stated Goal: return home PT Goal Formulation: With patient/family Time For Goal Achievement: Sep 15, 2017 Potential to Achieve Goals: Good    Frequency     Barriers to discharge        Co-evaluation               AM-PAC PT "6 Clicks" Daily Activity  Outcome Measure Difficulty turning over in bed (including adjusting bedclothes, sheets and blankets)?: None Difficulty moving from lying on back to sitting on the side of the bed? : None Difficulty sitting down on and  standing up from a chair with arms (e.g., wheelchair, bedside commode, etc,.)?: None Help needed moving to and from a bed to chair (including a wheelchair)?: None Help needed walking in hospital room?: None Help needed climbing 3-5 steps with a railing? : None 6 Click Score: 24    End of Session   Activity Tolerance: Patient tolerated treatment well Patient left: in bed;with call bell/phone within reach;with family/visitor present(seated at bedside) Nurse Communication: Mobility status PT Visit Diagnosis: Unsteadiness on feet (R26.81);Other abnormalities of gait and mobility (R26.89);Muscle weakness (generalized) (M62.81)    Time: 4315-4008 PT Time Calculation (min) (ACUTE ONLY): 18 min   Charges:   PT Evaluation $PT Eval Low Complexity: 1 Low PT Treatments $Therapeutic Activity: 23-37 mins   PT G Codes:        1:56 PM, 15-Sep-2017 Lonell Grandchild, MPT Physical Therapist with [facility] [phone] office [phone] mobile phone

## 2017-09-02 NOTE — Progress Notes (Signed)
ANTICOAGULATION CONSULT NOTE - Initial Consult  Pharmacy Consult for apixaban Indication: pulmonary embolus  No Known Allergies  Patient Measurements: Height: 6' (182.9 cm) Weight: 216 lb 14.9 oz (98.4 kg) IBW/kg (Calculated) : 77.6   Vital Signs: Temp: 98.6 F (37 C) (07/23 0636) Temp Source: Oral (07/23 0636) BP: 130/84 (07/23 0636) Pulse Rate: 76 (07/23 0636)  Labs: Recent Labs    08/30/17 2209 08/31/17 0428 08/31/17 0429 09/01/17 0358 09/01/17 0359 09/02/17 0808  HGB  --  12.1*  --   --  12.6*  --   HCT  --  37.1*  --   --  39.2  --   PLT  --  111*  --   --  125*  --   HEPARINUNFRC 0.56  --  0.57 0.69  --  0.68  CREATININE  --  1.09  --   --   --   --   TROPONINI 0.03* <0.03  --   --   --   --     Estimated Creatinine Clearance: 75.5 mL/min (by C-G formula based on SCr of 1.09 mg/dL).   Medical History: Past Medical History:  Diagnosis Date  . Asbestos exposure   . Cataract   . Hyperlipidemia   . Hypertension   . Liver cancer ([place])     Medications:  Medications Prior to Admission  Medication Sig Dispense Refill Last Dose  . amLODipine (NORVASC) 10 MG tablet TAKE ONE (1) TABLET EACH DAY 90 tablet 3 08/30/2017 at Unknown time  . atorvastatin (LIPITOR) 40 MG tablet TAKE ONE (1) TABLET EACH DAY (Patient taking differently: Take 40 mg by mouth every morning. TAKE ONE (1) TABLET EACH DAY) 90 tablet 3 08/30/2017 at Unknown time  . cholecalciferol (VITAMIN D) 1000 UNITS tablet Take 1,000 Units by mouth daily.   08/30/2017 at Unknown time  . Glucosamine-Chondroitin (GLUCOSAMINE CHONDR COMPLEX PO) Take 1 tablet by mouth daily.   08/30/2017 at Unknown time  . hydrochlorothiazide (HYDRODIURIL) 25 MG tablet TAKE ONE (1) TABLET EACH DAY 90 tablet 3 08/30/2017 at Unknown time  . pantoprazole (PROTONIX) 40 MG tablet TAKE ONE (1) TABLET EACH DAY 90 tablet 3 08/30/2017 at Unknown time  . potassium chloride (K-DUR) 10 MEQ tablet TAKE ONE (1) TABLET EACH DAY 90 tablet 3 08/30/2017  at Unknown time    Assessment: Pharmacy consulted to dose apixaban in patient with pulmonary embolism.  Patient is currently on heparin IV and will transition to apixaban today.  Heparin is to be stopped at same time as giving first apixaban dose.  Goal of Therapy:   Monitor platelets by anticoagulation protocol: Yes   Plan:  Apixaban 10 mg BID x 7 days followed by Apixaban 5 mg BID Monitor labs and s/s of bleeding.  Ramond Craver 09/02/2017,2:03 PM

## 2017-09-02 NOTE — Progress Notes (Signed)
Late entry: Discharge instructions reviewed with patient and his wife at bedside. Given copy of AVS, prescription sent to pharmacy by MD. Verbalized understanding of instructions, when to follow-up and when to seek medical care for any concerns. Verbalized understanding of Eliquis education provided by pharmacy and nursing staff. Received voucher for eliquis from CM prior to discharge. IV sites d/c'd, sites within normal limits. No c/o pain or discomfort at time of discharge. Pt left floor in stable condition via w/c accompanied by nurse tech. Discharged home this afternoon. Donavan Foil, RN

## 2017-09-03 ENCOUNTER — Telehealth: Payer: Self-pay | Admitting: *Deleted

## 2017-09-03 NOTE — Telephone Encounter (Signed)
Incomplete call for TCM

## 2017-09-04 ENCOUNTER — Telehealth: Payer: Self-pay | Admitting: Family Medicine

## 2017-09-04 NOTE — Telephone Encounter (Signed)
appt scheduled Pt notified 

## 2017-09-10 ENCOUNTER — Ambulatory Visit (INDEPENDENT_AMBULATORY_CARE_PROVIDER_SITE_OTHER): Payer: PPO | Admitting: Family Medicine

## 2017-09-10 ENCOUNTER — Encounter: Payer: Self-pay | Admitting: Family Medicine

## 2017-09-10 VITALS — BP 126/86 | HR 75 | Temp 97.0°F | Ht 72.0 in | Wt 220.0 lb

## 2017-09-10 DIAGNOSIS — Z09 Encounter for follow-up examination after completed treatment for conditions other than malignant neoplasm: Secondary | ICD-10-CM

## 2017-09-10 DIAGNOSIS — I2692 Saddle embolus of pulmonary artery without acute cor pulmonale: Secondary | ICD-10-CM

## 2017-09-10 LAB — CBC WITH DIFFERENTIAL/PLATELET
Basophils Absolute: 0 10*3/uL (ref 0.0–0.2)
Basos: 0 %
EOS (ABSOLUTE): 0.2 10*3/uL (ref 0.0–0.4)
EOS: 3 %
HEMATOCRIT: 39.7 % (ref 37.5–51.0)
Hemoglobin: 12.8 g/dL — ABNORMAL LOW (ref 13.0–17.7)
Immature Grans (Abs): 0 10*3/uL (ref 0.0–0.1)
Immature Granulocytes: 0 %
Lymphocytes Absolute: 0.6 10*3/uL — ABNORMAL LOW (ref 0.7–3.1)
Lymphs: 7 %
MCH: 28.7 pg (ref 26.6–33.0)
MCHC: 32.2 g/dL (ref 31.5–35.7)
MCV: 89 fL (ref 79–97)
MONOCYTES: 8 %
MONOS ABS: 0.6 10*3/uL (ref 0.1–0.9)
Neutrophils Absolute: 6.2 10*3/uL (ref 1.4–7.0)
Neutrophils: 82 %
Platelets: 197 10*3/uL (ref 150–450)
RBC: 4.46 x10E6/uL (ref 4.14–5.80)
RDW: 14.6 % (ref 12.3–15.4)
WBC: 7.6 10*3/uL (ref 3.4–10.8)

## 2017-09-10 LAB — BMP8+EGFR
BUN/Creatinine Ratio: 15 (ref 10–24)
BUN: 19 mg/dL (ref 8–27)
CO2: 25 mmol/L (ref 20–29)
CREATININE: 1.25 mg/dL (ref 0.76–1.27)
Calcium: 9.2 mg/dL (ref 8.6–10.2)
Chloride: 101 mmol/L (ref 96–106)
GFR calc Af Amer: 67 mL/min/{1.73_m2} (ref >59)
GFR calc non Af Amer: 58 mL/min/{1.73_m2} — ABNORMAL LOW (ref >59)
GLUCOSE: 107 mg/dL — AB (ref 65–99)
Potassium: 4 mmol/L (ref 3.5–5.2)
SODIUM: 141 mmol/L (ref 134–144)

## 2017-09-10 NOTE — Progress Notes (Signed)
Subjective:    Patient ID: Grant Cummings, male    DOB: [birth year], 72 y.o.   MRN: [id number]  HPI Patient here today for hospital follow up from [facility] where he was admitted from 08/30/17 to 09/02/17. Primary Dx was acute saddle pulmonary embolism.  On discharge it was requested that we repeat a CBC and BMP.  The saddle pulmonary embolus was found from getting a CT angiogram of the chest.  Following admission his generalized weakness and shortness of breath got better.  No thrombolytics were indicated at the time of the hospitalization but the patient was treated with IV anticoagulation.  He was discharged on Eliquis.  He comes to the visit today with his wife.  In addition, this patient has an ongoing history of liver cancer that is being followed closely at [facility] along with hyperlipidemia and hypertension.  It is pleasant this morning and doing well and feeling better.  At the time he entered the hospital he also had an intestinal virus and was weak from the intestinal virus.  He has plans to see his hematologist oncologist.  The patient today notes an improving strength and resolving weakness.  His breathing is better.  He has had no chest pain.  He has had no bleeding issues.  His bowel habits are back to normal.  He is passing his water without problems.    Patient Active Problem List   Diagnosis Date Noted  . Acute saddle pulmonary embolism ([place]) 08/30/2017  . Blood type A- 05/08/2015  . Hernia of abdominal wall 05/08/2015  . Edema 11/22/2014  . BPH (benign prostatic hyperplasia) 03/22/2013  . Vitamin D deficiency 03/22/2013  . Hearing deficit 03/22/2013  . Liver cancer ([place])   . Hyperlipidemia   . Hypertension    Outpatient Encounter Medications as of 09/10/2017  Medication Sig  . amLODipine (NORVASC) 10 MG tablet TAKE ONE (1) TABLET EACH DAY  . apixaban (ELIQUIS) 5 MG TABS tablet Take '10mg'$  po bid for 7 days then '5mg'$  po bid  . atorvastatin  (LIPITOR) 40 MG tablet TAKE ONE (1) TABLET EACH DAY (Patient taking differently: Take 40 mg by mouth every morning. TAKE ONE (1) TABLET EACH DAY)  . cholecalciferol (VITAMIN D) 1000 UNITS tablet Take 1,000 Units by mouth daily.  . Glucosamine-Chondroitin (GLUCOSAMINE CHONDR COMPLEX PO) Take 1 tablet by mouth daily.  . hydrochlorothiazide (HYDRODIURIL) 25 MG tablet TAKE ONE (1) TABLET EACH DAY  . pantoprazole (PROTONIX) 40 MG tablet TAKE ONE (1) TABLET EACH DAY  . potassium chloride (K-DUR) 10 MEQ tablet TAKE ONE (1) TABLET EACH DAY   No facility-administered encounter medications on file as of 09/10/2017.      Review of Systems  Constitutional: Positive for fatigue.  HENT: Negative.   Eyes: Negative.   Respiratory: Positive for shortness of breath (better now).   Cardiovascular: Negative.   Gastrointestinal: Negative.   Endocrine: Negative.   Genitourinary: Negative.   Musculoskeletal: Negative.   Skin: Negative.   Allergic/Immunologic: Negative.   Neurological: Negative.   Hematological: Negative.   Psychiatric/Behavioral: Negative.        Objective:   Physical Exam  Constitutional: He is oriented to person, place, and time. He appears well-developed and well-nourished. No distress.  The patient is pleasant and relaxed and in good spirits this morning.  HENT:  Head: Normocephalic and atraumatic.  Mouth/Throat: No oropharyngeal exudate.  Eyes: Pupils are equal, round, and reactive to light. Conjunctivae and EOM are normal. Right  eye exhibits no discharge. Left eye exhibits no discharge. No scleral icterus.  Neck: Normal range of motion. Neck supple. No thyromegaly present.  No bruits thyromegaly or anterior cervical adenopathy  Cardiovascular: Normal rate, regular rhythm and normal heart sounds.  No murmur heard. Heart has a regular rate and rhythm at 72/min  Pulmonary/Chest: Effort normal and breath sounds normal. No respiratory distress. He has no wheezes. He has no rales.    Clear anteriorly and posteriorly  Abdominal: Soft. Bowel sounds are normal. He exhibits no mass. There is no tenderness.  Musculoskeletal: Normal range of motion. He exhibits edema.  Slight 1+ pretibial edema right greater than left  Lymphadenopathy:    He has no cervical adenopathy.  Neurological: He is alert and oriented to person, place, and time. He has normal reflexes.  Skin: Skin is warm and dry. No rash noted.  Psychiatric: He has a normal mood and affect. His behavior is normal. Judgment and thought content normal.  The patient's mood affect and behavior were normal.  Nursing note and vitals reviewed.   BP 126/86 (BP Location: Right Arm)   Pulse 75   Temp (!) 97 F (36.1 C) (Oral)   Ht 6' (1.829 m)   Wt 220 lb (99.8 kg)   SpO2 97%   BMI 29.84 kg/m        Assessment & Plan:  1. Hospital discharge follow-up - BMP8+EGFR - CBC with Differential/Platelet  2. Acute saddle pulmonary embolism without acute cor pulmonale (HCC) -The patient is feeling better has less shortness of breath and is having no chest pain and will stay on Eliquis as prescribed and he is scheduling a follow-up appointment with his hematologist oncologist.  Patient Instructions  We will call with the results of the CBC and BMP as soon as these results have been returned Patient will schedule appointment with hematology oncology After that visit we will decide if any further visits are necessary with a pulmonologist. He will continue to drink plenty of fluids and stay well-hydrated He will continue with the Eliquis and will notify us if any bleeding issues occur with this.  Arrie Senate MD

## 2017-09-10 NOTE — Patient Instructions (Signed)
We will call with the results of the CBC and BMP as soon as these results have been returned Patient will schedule appointment with hematology oncology After that visit we will decide if any further visits are necessary with a pulmonologist. He will continue to drink plenty of fluids and stay well-hydrated He will continue with the Eliquis and will notify us if any bleeding issues occur with this.

## 2017-09-11 DIAGNOSIS — L0100 Impetigo, unspecified: Secondary | ICD-10-CM | POA: Diagnosis not present

## 2017-09-22 ENCOUNTER — Telehealth: Payer: Self-pay | Admitting: Family Medicine

## 2017-09-22 DIAGNOSIS — E785 Hyperlipidemia, unspecified: Secondary | ICD-10-CM

## 2017-09-22 DIAGNOSIS — I10 Essential (primary) hypertension: Secondary | ICD-10-CM

## 2017-09-22 DIAGNOSIS — C221 Intrahepatic bile duct carcinoma: Secondary | ICD-10-CM | POA: Diagnosis not present

## 2017-09-22 MED ORDER — PANTOPRAZOLE SODIUM 40 MG PO TBEC: DELAYED_RELEASE_TABLET | ORAL | 3 refills | Status: DC

## 2017-09-22 MED ORDER — APIXABAN 5 MG PO TABS: ORAL_TABLET | ORAL | 1 refills | Status: DC

## 2017-09-22 MED ORDER — HYDROCHLOROTHIAZIDE 25 MG PO TABS: ORAL_TABLET | ORAL | 3 refills | Status: DC

## 2017-09-22 MED ORDER — AMLODIPINE BESYLATE 10 MG PO TABS: ORAL_TABLET | ORAL | 3 refills | Status: DC

## 2017-09-22 MED ORDER — ATORVASTATIN CALCIUM 40 MG PO TABS: ORAL_TABLET | ORAL | 3 refills | Status: DC

## 2017-09-22 NOTE — Telephone Encounter (Signed)
pt aware 

## 2017-09-22 NOTE — Telephone Encounter (Signed)
Pt wife is wanting to speak to Roselyn Reef about some the patients medicines

## 2017-09-22 NOTE — Telephone Encounter (Signed)
rx sent

## 2017-10-06 DIAGNOSIS — R936 Abnormal findings on diagnostic imaging of limbs: Secondary | ICD-10-CM | POA: Diagnosis not present

## 2017-10-06 DIAGNOSIS — C221 Intrahepatic bile duct carcinoma: Secondary | ICD-10-CM | POA: Diagnosis not present

## 2017-10-08 DIAGNOSIS — Z7901 Long term (current) use of anticoagulants: Secondary | ICD-10-CM | POA: Diagnosis not present

## 2017-10-08 DIAGNOSIS — C221 Intrahepatic bile duct carcinoma: Secondary | ICD-10-CM | POA: Diagnosis not present

## 2017-10-08 DIAGNOSIS — Z86711 Personal history of pulmonary embolism: Secondary | ICD-10-CM | POA: Diagnosis not present

## 2017-10-08 DIAGNOSIS — Z9221 Personal history of antineoplastic chemotherapy: Secondary | ICD-10-CM | POA: Diagnosis not present

## 2017-10-08 DIAGNOSIS — Z9049 Acquired absence of other specified parts of digestive tract: Secondary | ICD-10-CM | POA: Diagnosis not present

## 2017-10-08 DIAGNOSIS — Z923 Personal history of irradiation: Secondary | ICD-10-CM | POA: Diagnosis not present

## 2017-10-08 DIAGNOSIS — I82511 Chronic embolism and thrombosis of right femoral vein: Secondary | ICD-10-CM | POA: Diagnosis not present

## 2017-10-08 DIAGNOSIS — I824Z1 Acute embolism and thrombosis of unspecified deep veins of right distal lower extremity: Secondary | ICD-10-CM | POA: Diagnosis not present

## 2017-10-08 DIAGNOSIS — Z9889 Other specified postprocedural states: Secondary | ICD-10-CM | POA: Diagnosis not present

## 2017-10-18 ENCOUNTER — Other Ambulatory Visit: Payer: Self-pay | Admitting: Family Medicine

## 2017-11-12 DIAGNOSIS — C221 Intrahepatic bile duct carcinoma: Secondary | ICD-10-CM | POA: Diagnosis not present

## 2017-11-12 DIAGNOSIS — Z923 Personal history of irradiation: Secondary | ICD-10-CM | POA: Diagnosis not present

## 2017-11-12 DIAGNOSIS — I2699 Other pulmonary embolism without acute cor pulmonale: Secondary | ICD-10-CM | POA: Diagnosis not present

## 2017-11-12 DIAGNOSIS — K769 Liver disease, unspecified: Secondary | ICD-10-CM | POA: Diagnosis not present

## 2017-11-12 DIAGNOSIS — M65852 Other synovitis and tenosynovitis, left thigh: Secondary | ICD-10-CM | POA: Diagnosis not present

## 2017-11-12 DIAGNOSIS — Z9221 Personal history of antineoplastic chemotherapy: Secondary | ICD-10-CM | POA: Diagnosis not present

## 2017-11-12 DIAGNOSIS — Z7901 Long term (current) use of anticoagulants: Secondary | ICD-10-CM | POA: Diagnosis not present

## 2017-11-12 DIAGNOSIS — Z86711 Personal history of pulmonary embolism: Secondary | ICD-10-CM | POA: Diagnosis not present

## 2017-11-12 DIAGNOSIS — I82511 Chronic embolism and thrombosis of right femoral vein: Secondary | ICD-10-CM | POA: Diagnosis not present

## 2017-11-12 DIAGNOSIS — I82451 Acute embolism and thrombosis of right peroneal vein: Secondary | ICD-10-CM | POA: Diagnosis not present

## 2017-11-12 DIAGNOSIS — Z9049 Acquired absence of other specified parts of digestive tract: Secondary | ICD-10-CM | POA: Diagnosis not present

## 2017-12-08 DIAGNOSIS — D0439 Carcinoma in situ of skin of other parts of face: Secondary | ICD-10-CM | POA: Diagnosis not present

## 2017-12-08 DIAGNOSIS — D485 Neoplasm of uncertain behavior of skin: Secondary | ICD-10-CM | POA: Diagnosis not present

## 2017-12-08 DIAGNOSIS — L570 Actinic keratosis: Secondary | ICD-10-CM | POA: Diagnosis not present

## 2017-12-15 ENCOUNTER — Ambulatory Visit (INDEPENDENT_AMBULATORY_CARE_PROVIDER_SITE_OTHER): Payer: PPO | Admitting: *Deleted

## 2017-12-15 ENCOUNTER — Encounter: Payer: Self-pay | Admitting: *Deleted

## 2017-12-15 VITALS — BP 140/81 | HR 78 | Ht 72.0 in | Wt 234.0 lb

## 2017-12-15 DIAGNOSIS — Z0000 Encounter for general adult medical examination without abnormal findings: Secondary | ICD-10-CM

## 2017-12-15 DIAGNOSIS — Z23 Encounter for immunization: Secondary | ICD-10-CM | POA: Diagnosis not present

## 2017-12-15 NOTE — Patient Instructions (Signed)
  Grant Cummings , Thank you for taking time to come for your Medicare Wellness Visit. I appreciate your ongoing commitment to your health goals. Please review the following plan we discussed and let me know if I can assist you in the future.   These are the goals we discussed: Goals    . DIET - INCREASE WATER INTAKE    . Reduce sodium intake    . Reduce sugar intake to X grams per day    . Weight (lb) < 215 lb (97.5 kg)       This is a list of the screening recommended for you and due dates:  Health Maintenance  Topic Date Due  . Stool Blood Test  05/12/2016  . Flu Shot  09/11/2017  .  Hepatitis C: One time screening is recommended by Center for Disease Control  (CDC) for  adults born from [birth year] through 1964.   11/30/2021*  . Colon Cancer Screening  02/12/2019  . Tetanus Vaccine  11/30/2026  . Pneumonia vaccines  Completed  *Topic was postponed. The date shown is not the original due date.

## 2017-12-15 NOTE — Progress Notes (Signed)
Subjective:   Grant Cummings is a 72 y.o. male who presents for a subsequent Medicare Annual Wellness Visit.  Patient Care Team: Chipper Herb, MD as PCP - General (Family Medicine) Pearlie Oyster Estill Bamberg, MD as Consulting Physician (Specialist) Jimmy Footman, Meredith Staggers, MD as Referring Physician (Hematology and Oncology) Particia Nearing, [place] (Optometry) Monna Fam, MD as Consulting Physician (Ophthalmology) Olegario Messier, MD as Referring Physician (Internal Medicine) Jyl Heinz, MD as Referring Physician (Surgery)  Hospitalizations, surgeries, and ER visits in previous 12 months ED to hospital admission in June 2019 with a saddle pulmonary embolism in June 2019.    Review of Systems    Patient reports that her overall health is unchanged compared to last year.  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 Risk factors for CAD-advanced age, dyslipidemia, family history of premature CVD, HTN, sedentary lifestyle   GI: Colonoscopy with Dr Laural Golden in 2011 and had f/u testing at [facility]. Stomach polyp removed in 2018 and history of recurrent liver cancer, cholangiocarcinoma. He has a CT scan every 6 months to f/u.   Current Medications (verified) Outpatient Encounter Medications as of 12/15/2017  Medication Sig  . amLODipine (NORVASC) 10 MG tablet TAKE ONE (1) TABLET EACH DAY  . atorvastatin (LIPITOR) 40 MG tablet TAKE ONE (1) TABLET EACH DAY  . cholecalciferol (VITAMIN D) 1000 UNITS tablet Take 1,000 Units by mouth daily.  . Glucosamine-Chondroitin (GLUCOSAMINE CHONDR COMPLEX PO) Take 1 tablet by mouth daily.  . hydrochlorothiazide (HYDRODIURIL) 25 MG tablet TAKE ONE (1) TABLET EACH DAY  . pantoprazole (PROTONIX) 40 MG tablet TAKE ONE (1) TABLET EACH DAY  . potassium chloride (K-DUR) 10 MEQ tablet TAKE ONE (1) TABLET EACH DAY  . [DISCONTINUED] ELIQUIS 5 MG TABS tablet TAKE TWO (2) TABLETS BY MOUTH TWICE DAILY FOR 7 DAYS THEN ONE TABLET BY MOUTH TWICE  DAILY.   No facility-administered encounter medications on file as of 12/15/2017.     Allergies (verified) Patient has no known allergies.   History: Past Medical History:  Diagnosis Date  . Asbestos exposure   . Cataract   . Hyperlipidemia   . Hypertension   . Liver cancer ([place])    cholangeocarcinoma  . PE (pulmonary thromboembolism) ([place])   . Saddle pulmonary embolus [facility])    Past Surgical History:  Procedure Laterality Date  . CARPAL TUNNEL WITH CUBITAL TUNNEL Right   . CHOLECYSTECTOMY    . EYE SURGERY Left 2017   lens implant to correct stigmatism / cataract  . KNEE SURGERY     left  . partial liver removal     x 2   . RADIOFREQUENCY ABLATION LIVER TUMOR     with biopsy  - NEG   Family History  Problem Relation Age of Onset  . Alzheimer's disease Mother   . Cancer Mother 65       breast  . Breast cancer Mother   . Alzheimer's disease Father   . Cancer Father        lymph nodes- jaw  . Hypertension Father   . Heart disease Father        heart attack   . Cancer Sister        ovarian  . Hypertension Sister   . GI problems Sister   . Melanoma Daughter 39  . Clotting disorder Paternal Grandfather    Social History   Socioeconomic History  . Marital status: Married    Spouse name: Not on file  . Number  of children: 3  . Years of education: Not on file  . Highest education level: Not on file  Occupational History  . Occupation: retired  Scientific laboratory technician  . Financial resource strain: Not hard at all  . Food insecurity:    Worry: Never true    Inability: Never true  . Transportation needs:    Medical: No    Non-medical: No  Tobacco Use  . Smoking status: Passive Smoke Exposure - Never Smoker  . Smokeless tobacco: Never Used  Substance and Sexual Activity  . Alcohol use: No  . Drug use: No  . Sexual activity: Yes  Lifestyle  . Physical activity:    Days per week: 0 days    Minutes per session: 0 min  . Stress: To some extent  Relationships  .  Social connections:    Talks on phone: More than three times a week    Gets together: More than three times a week    Attends religious service: More than 4 times per year    Active member of club or organization: Yes    Attends meetings of clubs or organizations: More than 4 times per year    Relationship status: Married  Other Topics Concern  . Not on file  Social History Narrative   Married and lives at home with his wife. They have 3 adult children who live in the local area. He has 7 grandchildren and 2 foster grandchildren. He picks up 2 of the grandkids from school daily. He is very involved with his church and with his family. He has considered joining a gym but has not. He does stay busy at home. 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      Clinical Intake:  Pre-visit preparation completed: No  Pain : No/denies pain     Nutritional Status: BMI > 30  Obese Nutritional Risks: None(3 meals a day. Typically has a light lunch. Cooks most meals at home but they do eat out on occasion. Drinks tea and water. ) Diabetes: No  How often do you need to have someone help you when you read instructions, pamphlets, or other written materials from your doctor or pharmacy?: 1 - Never What is the last grade level you completed in school?: 1 year of college  Interpreter Needed?: No  Information entered by :: Chong Sicilian, RN   Activities of Daily Living In your present state of health, do you have any difficulty performing the following activities: 12/15/2017 08/30/2017  Hearing? 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 Y N  Difficulty concentrating or making decisions? N N  Walking or climbing stairs? N N  Dressing or bathing? N N  Doing errands, shopping? N N  Preparing Food and eating ? N -  Using the Toilet? N -  In the past six months, have you accidently leaked urine? N -  Do you have problems with loss of bowel control? N -  Managing your  Medications? N -  Managing your Finances? N -  Housekeeping or managing your Housekeeping? N -  Some recent data might be hidden     Exercise Current Exercise Habits: Home exercise routine(Stays busy at home and yard. Spends a lot of time ), Exercise limited by: None identified   Depression Screen PHQ 2/9 Scores 12/15/2017 09/10/2017 08/29/2017 06/17/2017 03/10/2017 03/03/2017 03/03/2017  PHQ - 2 Score 0 0 0 0 0 0 0  Fall Risk Fall Risk  12/25/2017 12/15/2017 09/10/2017 08/29/2017 06/17/2017  Falls in the past year? (No Data) 0 No No No  Comment Has stairs to the basement and one step from garage to main house. All have rails. Laundry is on the main living floor.  - - - -  Number falls in past yr: - 0 - - -  Injury with Fall? - - - - -     Objective:    Today's Vitals   12/15/17 0843  BP: 140/81  Pulse: 78  Weight: 234 lb (106.1 kg)  Height: 6' (1.829 m)   Body mass index is 31.74 kg/m.  Advanced Directives 08/30/2017 12/09/2016 05/08/2015  Does Patient Have a Medical Advance Directive? Yes Yes Yes  Type of Advance Directive Healthcare Power of Attorney Living will;Healthcare Power of [place];Living will  Does patient want to make changes to medical advance directive? No - Patient declined No - Patient declined -  Copy of [place] in Chart? - No - copy requested No - copy requested  Would patient like information on creating a medical advance directive? No - Patient declined - -    Hearing/Vision  No hearing or vision deficits noted during visit.  Cognitive Function: MMSE - Mini Mental State Exam 12/09/2016 05/08/2015  Orientation to time 5 5  Orientation to Place 5 5  Registration 3 3  Attention/ Calculation 4 4  Recall 3 3  Language- name 2 objects 2 2  Language- repeat 1 1  Language- follow 3 step command 3 3  Language- read & follow direction 1 1  Write a sentence 1 1  Copy design 1 0  Total score 29 28     6CIT  Screen 12/15/2017  What Year? 0 points  What month? 0 points  What time? 0 points  Count back from 20 0 points  Months in reverse 0 points  Repeat phrase 0 points  Total Score 0   Normal Cognitive Function Screening: Yes    Immunizations and Health Maintenance Immunization History  Administered Date(s) Administered  . Influenza, High Dose Seasonal PF 12/01/2015, 12/09/2016, 12/15/2017  . Influenza,inj,Quad PF,6+ Mos 12/21/2013, 11/22/2014  . Pneumococcal Conjugate-13 11/22/2014  . Pneumococcal-Unspecified 12/06/2012  . Tdap 11/29/2016   Health Maintenance Due  Topic Date Due  . COLON CANCER SCREENING ANNUAL FOBT  05/12/2016   Health Maintenance  Topic Date Due  . COLON CANCER SCREENING ANNUAL FOBT  05/12/2016  . Hepatitis C Screening  11/30/2021 (Originally 1945-08-02)  . COLONOSCOPY  02/12/2019  . TETANUS/TDAP  11/30/2026  . INFLUENZA VACCINE  Completed  . PNA vac Low Risk Adult  Completed        Assessment:   This is a routine wellness examination for [place].    Plan:    Goals    . DIET - INCREASE WATER INTAKE    . Reduce sodium intake    . Reduce sugar intake to X grams per day    . Weight (lb) < 215 lb (97.5 kg)        Health Maintenance Recommendations: Colorectal cancer screening-need report from 2011 Flu vaccine  Additional Screening Recommendations: Lung: Low Dose CT Chest recommended if Age 43-80 years, 30 pack-year currently smoking OR have quit w/in 15years. Patient does not qualify. Hepatitis C Screening recommended: yes  Today's Orders Orders Placed This Encounter  Procedures  . Flu vaccine HIGH DOSE PF    Keep f/u with Chipper Herb, MD  and any other specialty appointments you may have Continue current medications Move carefully to avoid falls. Use assistive devices like a cane or walker if needed. Aim for at least 150 minutes of moderate activity a week. This can be done with chair exercises if necessary. Read or work on puzzles  daily Stay connected with friends and family  I have personally reviewed and noted the following in the patient's chart:   . Medical and social history . Use of alcohol, tobacco or illicit drugs  . Current medications and supplements . Functional ability and status . Nutritional status . Physical activity . Advanced directives . List of other physicians . Hospitalizations, surgeries, and ER visits in previous 12 months . Vitals . Screenings to include cognitive, depression, and falls . Referrals and appointments  In addition, I have reviewed and discussed with patient certain preventive protocols, quality metrics, and best practice recommendations. A written personalized care plan for preventive services as well as general preventive health recommendations were provided to patient.     Chong Sicilian, RN   12/15/2017

## 2017-12-22 ENCOUNTER — Other Ambulatory Visit: Payer: Self-pay | Admitting: Family Medicine

## 2017-12-24 ENCOUNTER — Ambulatory Visit: Payer: PPO | Admitting: Family Medicine

## 2017-12-25 ENCOUNTER — Encounter: Payer: Self-pay | Admitting: *Deleted

## 2017-12-29 ENCOUNTER — Encounter: Payer: Self-pay | Admitting: Family Medicine

## 2017-12-29 ENCOUNTER — Ambulatory Visit (INDEPENDENT_AMBULATORY_CARE_PROVIDER_SITE_OTHER): Payer: PPO | Admitting: Family Medicine

## 2017-12-29 VITALS — BP 130/82 | Temp 97.1°F | Ht 72.0 in | Wt 233.0 lb

## 2017-12-29 DIAGNOSIS — E7800 Pure hypercholesterolemia, unspecified: Secondary | ICD-10-CM

## 2017-12-29 DIAGNOSIS — C220 Liver cell carcinoma: Secondary | ICD-10-CM

## 2017-12-29 DIAGNOSIS — I2692 Saddle embolus of pulmonary artery without acute cor pulmonale: Secondary | ICD-10-CM

## 2017-12-29 DIAGNOSIS — C229 Malignant neoplasm of liver, not specified as primary or secondary: Secondary | ICD-10-CM

## 2017-12-29 DIAGNOSIS — E559 Vitamin D deficiency, unspecified: Secondary | ICD-10-CM

## 2017-12-29 DIAGNOSIS — I10 Essential (primary) hypertension: Secondary | ICD-10-CM

## 2017-12-29 DIAGNOSIS — Z0000 Encounter for general adult medical examination without abnormal findings: Secondary | ICD-10-CM | POA: Diagnosis not present

## 2017-12-29 DIAGNOSIS — N400 Enlarged prostate without lower urinary tract symptoms: Secondary | ICD-10-CM

## 2017-12-29 LAB — URINALYSIS, COMPLETE
BILIRUBIN UA: NEGATIVE
Glucose, UA: NEGATIVE
KETONES UA: NEGATIVE
LEUKOCYTES UA: NEGATIVE
Nitrite, UA: NEGATIVE
PROTEIN UA: NEGATIVE
RBC, UA: NEGATIVE
SPEC GRAV UA: 1.02 (ref 1.005–1.030)
Urobilinogen, Ur: 0.2 mg/dL (ref 0.2–1.0)
pH, UA: 6.5 (ref 5.0–7.5)

## 2017-12-29 LAB — MICROSCOPIC EXAMINATION
Bacteria, UA: NONE SEEN
Epithelial Cells (non renal): NONE SEEN /hpf (ref 0–10)
RBC, UA: NONE SEEN /hpf (ref 0–2)
RENAL EPITHEL UA: NONE SEEN /HPF
WBC, UA: NONE SEEN /hpf (ref 0–5)

## 2017-12-29 NOTE — Progress Notes (Signed)
Subjective:    Patient ID: Grant Cummings, male    DOB: 08/14/1945, 72 y.o.   MRN: [id number]  HPI Patient is here today for annual wellness exam and follow up of chronic medical problems which includes hypertension and hyperlipidemia. He is taking medication regularly.  This patient is doing well overall and continues to be followed at [facility] for his liver cancer.  He gets scans routinely and did have a CT in June 2019.  He has no specific complaints today.  His weight is stable and his BMI is 31.74.  Blood pressure slightly elevated today.  He is due to get lab work and will return to the office fasting for this.  His liver cancer was a cholangiocarcinoma.  He has a history of asbestos exposure and pulmonary thromboembolism.  The most recent history was reviewed closely with the patient and his wife.  The problem with the shortness of breath started back in June and he was found to have a saddle embolus and was started on Eliquis after being kept in the hospital for several days.  His oncologist is aware of this.  He subsequently had an ultrasound and it was felt that the embolus came from his right leg.  He had been kicked by a cow prior to the shortness of breath episode.  His oncologist is aware of this.  He has had some left hip pain and this was felt to be synovitis.  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  He is currently doing well and still recovering from the embolus with his breathing getting better.    Patient Active Problem List   Diagnosis Date Noted  . Acute saddle pulmonary embolism ([place]) 08/30/2017  . Blood type A- 05/08/2015  . Hernia of abdominal wall 05/08/2015  . Edema 11/22/2014  . BPH (benign prostatic hyperplasia) 03/22/2013  . Vitamin D deficiency 03/22/2013  . Hearing deficit 03/22/2013  . Liver cancer ([place])   . Hyperlipidemia   . Hypertension    Outpatient Encounter Medications as of  12/29/2017  Medication Sig  . amLODipine (NORVASC) 10 MG tablet TAKE ONE (1) TABLET EACH DAY  . atorvastatin (LIPITOR) 40 MG tablet TAKE ONE (1) TABLET EACH DAY  . cholecalciferol (VITAMIN D) 1000 UNITS tablet Take 1,000 Units by mouth daily.  Marland Kitchen ELIQUIS 5 MG TABS tablet TAKE ONE TABLET BY MOUTH TWICE DAILY.  Marland Kitchen Glucosamine-Chondroitin (GLUCOSAMINE CHONDR COMPLEX PO) Take 1 tablet by mouth daily.  . hydrochlorothiazide (HYDRODIURIL) 25 MG tablet TAKE ONE (1) TABLET EACH DAY  . pantoprazole (PROTONIX) 40 MG tablet TAKE ONE (1) TABLET EACH DAY  . potassium chloride (K-DUR) 10 MEQ tablet TAKE ONE (1) TABLET EACH DAY   No facility-administered encounter medications on file as of 12/29/2017.       Review of Systems  Constitutional: Negative.   HENT: Negative.   Eyes: Negative.   Respiratory: Negative.   Cardiovascular: Negative.   Gastrointestinal: Negative.   Endocrine: Negative.   Genitourinary: Negative.   Musculoskeletal: Negative.   Skin: Negative.   Allergic/Immunologic: Negative.   Neurological: Negative.   Hematological: Negative.   Psychiatric/Behavioral: Negative.        Objective:   Physical Exam  Constitutional: He is oriented to person, place, and time. He appears well-developed and well-nourished. No distress.  The patient is alert and pleasant and doing well despite his bout with a saddle embolus from his right leg.  He is currently on Eliquis.  HENT:  Head: Normocephalic and atraumatic.  Right Ear: External ear normal.  Left Ear: External ear normal.  Nose: Nose normal.  Mouth/Throat: Oropharynx is clear and moist. No oropharyngeal exudate.  Eyes: Pupils are equal, round, and reactive to light. Conjunctivae and EOM are normal. Right eye exhibits no discharge. Left eye exhibits no discharge. No scleral icterus.  Follow-up with ophthalmology as planned for cataracts  Neck: Normal range of motion. Neck supple. No thyromegaly present.  No bruits thyromegaly or  anterior cervical adenopathy  Cardiovascular: Normal rate, regular rhythm, normal heart sounds and intact distal pulses.  No murmur heard. Heart has a regular rate and rhythm at 72/min  Pulmonary/Chest: Effort normal and breath sounds normal. He has no wheezes. He has no rales. He exhibits no tenderness.  Clear anteriorly and posteriorly and no axillary adenopathy or chest wall masses or tenderness  Abdominal: Soft. Bowel sounds are normal. He exhibits no mass. There is no tenderness.  Abdominal scar secondary to cholangiocarcinoma surgery.  No liver or spleen enlargement no inguinal adenopathy no masses palpable and no bruits  Genitourinary: Rectum normal and penis normal.  Genitourinary Comments: The prostate is slightly enlarged but smooth.  No rectal masses present.  External genitalia were within normal limits and no inguinal hernias were palpable.  Musculoskeletal: Normal range of motion. He exhibits no edema or tenderness.  Lymphadenopathy:    He has no cervical adenopathy.  Neurological: He is alert and oriented to person, place, and time. He has normal reflexes. No cranial nerve deficit.  Reflexes in lower extremity 1+ and equal with good strength bilaterally.  Skin: Skin is warm and dry. No erythema.  Recent facial surgery with removal of lesions  Psychiatric: He has a normal mood and affect. His behavior is normal. Judgment and thought content normal.  The patient's mood affect and behavior are all normal for him.  He has a very supportive wife.  Nursing note and vitals reviewed.   BP (!) 143/87 (BP Location: Left Arm)   Temp (!) 97.1 F (36.2 C) (Oral)   Ht 6' (1.829 m)   Wt 233 lb (105.7 kg)   BMI 31.60 kg/m        Assessment & Plan:  1. Annual physical exam -Patient will return to office for fasting lab work -He will continue to get CT scans because of his hepatocellular carcinoma. - BMP8+EGFR - CBC with Differential/Platelet - Lipid panel - VITAMIN D 25 Hydroxy  (Vit-D Deficiency, Fractures) - Hepatic function panel - PSA, total and free - Urinalysis, Complete  2. Essential hypertension -Patient's blood pressure was initially elevated but on the repeat was better with a large cuff. - BMP8+EGFR - CBC with Differential/Platelet - Hepatic function panel  3. Pure hypercholesterolemia -Continue with current treatment and therapeutic lifestyle changes - CBC with Differential/Platelet - Lipid panel  4. Vitamin D deficiency -Continue with current treatment - CBC with Differential/Platelet - VITAMIN D 25 Hydroxy (Vit-D Deficiency, Fractures)  5. Benign prostatic hyperplasia, unspecified whether lower urinary tract symptoms present -No problems with voiding. - CBC with Differential/Platelet - PSA, total and free - Urinalysis, Complete  6. 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 -This occurred back in June of this year.  The patient is now on Eliquis.  His oncologist is aware of this.  7. Malignant neoplasm of liver, unspecified liver malignancy type [facility]) -Follow-up with oncology as planned  8. Hepatocellular carcinoma ([place]) -Follow-up  with oncology as planned and get CT scans as they request.  Patient Instructions                       Medicare Annual Wellness Visit  [facility] and the medical providers at [facility] strive to bring you the best medical care.  In doing so we not only want to address your current medical conditions and concerns but also to detect new conditions early and prevent illness, disease and health-related problems.    Medicare offers a yearly Wellness Visit which allows our clinical staff to assess your need for preventative services including immunizations, lifestyle education, counseling to decrease risk of preventable diseases and screening for fall risk and other medical concerns.    This visit is provided free of charge (no copay) for all  Medicare recipients. The clinical pharmacists at [facility] have begun to conduct these Wellness Visits which will also include a thorough review of all your medications.    As you primary medical provider recommend that you make an appointment for your Annual Wellness Visit if you have not done so already this year.  You may set up this appointment before you leave today or you may call back ([phone]) and schedule an appointment.  Please make sure when you call that you mention that you are scheduling your Annual Wellness Visit with the clinical pharmacist so that the appointment may be made for the proper length of time.    Continue current medications. Continue good therapeutic lifestyle changes which include good diet and exercise. Fall precautions discussed with patient. If an FOBT was given today- please return it to our front desk. If you are over 75 years old - you may need Prevnar 69 or the adult Pneumonia vaccine.  **Flu shots are available--- please call and schedule a FLU-CLINIC appointment**  After your visit with [place] today you will receive a survey in the mail or online from [employer] regarding your care with [place]. Please take a moment to fill this out. Your feedback is very important to [place] as you can help [place] better understand your patient needs as well as improve your experience and satisfaction. WE CARE ABOUT YOU!!!   Continue to follow-up with specialty doctors because of the liver cancer and follow through with the recommendations Watch sodium intake closely for better blood pressure control Check blood pressures periodically at home Continue to follow-up with ophthalmology Continue to stay active physically and hopefully your endurance and shortness of breath will get better  Arrie Senate MD

## 2017-12-29 NOTE — Patient Instructions (Addendum)
Medicare Annual Wellness Visit  [facility] and the medical providers at [facility] strive to bring you the best medical care.  In doing so we not only want to address your current medical conditions and concerns but also to detect new conditions early and prevent illness, disease and health-related problems.    Medicare offers a yearly Wellness Visit which allows our clinical staff to assess your need for preventative services including immunizations, lifestyle education, counseling to decrease risk of preventable diseases and screening for fall risk and other medical concerns.    This visit is provided free of charge (no copay) for all Medicare recipients. The clinical pharmacists at [facility] have begun to conduct these Wellness Visits which will also include a thorough review of all your medications.    As you primary medical provider recommend that you make an appointment for your Annual Wellness Visit if you have not done so already this year.  You may set up this appointment before you leave today or you may call back ([phone]) and schedule an appointment.  Please make sure when you call that you mention that you are scheduling your Annual Wellness Visit with the clinical pharmacist so that the appointment may be made for the proper length of time.    Continue current medications. Continue good therapeutic lifestyle changes which include good diet and exercise. Fall precautions discussed with patient. If an FOBT was given today- please return it to our front desk. If you are over 55 years old - you may need Prevnar 63 or the adult Pneumonia vaccine.  **Flu shots are available--- please call and schedule a FLU-CLINIC appointment**  After your visit with [place] today you will receive a survey in the mail or online from [employer] regarding your care with [place]. Please take a moment to fill this out. Your feedback is very  important to [place] as you can help [place] better understand your patient needs as well as improve your experience and satisfaction. WE CARE ABOUT YOU!!!   Continue to follow-up with specialty doctors because of the liver cancer and follow through with the recommendations Watch sodium intake closely for better blood pressure control Check blood pressures periodically at home Continue to follow-up with ophthalmology Continue to stay active physically and hopefully your endurance and shortness of breath will get better

## 2017-12-31 ENCOUNTER — Other Ambulatory Visit: Payer: PPO

## 2017-12-31 DIAGNOSIS — E7800 Pure hypercholesterolemia, unspecified: Secondary | ICD-10-CM | POA: Diagnosis not present

## 2017-12-31 DIAGNOSIS — N400 Enlarged prostate without lower urinary tract symptoms: Secondary | ICD-10-CM | POA: Diagnosis not present

## 2017-12-31 DIAGNOSIS — Z0000 Encounter for general adult medical examination without abnormal findings: Secondary | ICD-10-CM | POA: Diagnosis not present

## 2017-12-31 DIAGNOSIS — E559 Vitamin D deficiency, unspecified: Secondary | ICD-10-CM | POA: Diagnosis not present

## 2017-12-31 DIAGNOSIS — I10 Essential (primary) hypertension: Secondary | ICD-10-CM | POA: Diagnosis not present

## 2018-01-01 ENCOUNTER — Other Ambulatory Visit: Payer: Self-pay | Admitting: *Deleted

## 2018-01-01 DIAGNOSIS — E559 Vitamin D deficiency, unspecified: Secondary | ICD-10-CM

## 2018-01-01 DIAGNOSIS — E7800 Pure hypercholesterolemia, unspecified: Secondary | ICD-10-CM

## 2018-01-01 DIAGNOSIS — C44321 Squamous cell carcinoma of skin of nose: Secondary | ICD-10-CM | POA: Diagnosis not present

## 2018-01-01 DIAGNOSIS — C44329 Squamous cell carcinoma of skin of other parts of face: Secondary | ICD-10-CM | POA: Diagnosis not present

## 2018-01-01 DIAGNOSIS — E876 Hypokalemia: Secondary | ICD-10-CM

## 2018-01-01 LAB — BMP8+EGFR
BUN/Creatinine Ratio: 17 (ref 10–24)
BUN: 18 mg/dL (ref 8–27)
CALCIUM: 9.1 mg/dL (ref 8.6–10.2)
CO2: 23 mmol/L (ref 20–29)
Chloride: 102 mmol/L (ref 96–106)
Creatinine, Ser: 1.09 mg/dL (ref 0.76–1.27)
GFR, EST AFRICAN AMERICAN: 79 mL/min/{1.73_m2} (ref >59)
GFR, EST NON AFRICAN AMERICAN: 68 mL/min/{1.73_m2} (ref >59)
Glucose: 106 mg/dL — ABNORMAL HIGH (ref 65–99)
POTASSIUM: 3.4 mmol/L — AB (ref 3.5–5.2)
Sodium: 140 mmol/L (ref 134–144)

## 2018-01-01 LAB — CBC WITH DIFFERENTIAL/PLATELET
BASOS: 0 %
Basophils Absolute: 0 10*3/uL (ref 0.0–0.2)
EOS (ABSOLUTE): 0.1 10*3/uL (ref 0.0–0.4)
Eos: 1 %
HEMOGLOBIN: 11.9 g/dL — AB (ref 13.0–17.7)
Hematocrit: 37 % — ABNORMAL LOW (ref 37.5–51.0)
IMMATURE GRANS (ABS): 0 10*3/uL (ref 0.0–0.1)
Immature Granulocytes: 1 %
LYMPHS: 6 %
Lymphocytes Absolute: 0.4 10*3/uL — ABNORMAL LOW (ref 0.7–3.1)
MCH: 28.2 pg (ref 26.6–33.0)
MCHC: 32.2 g/dL (ref 31.5–35.7)
MCV: 88 fL (ref 79–97)
MONOCYTES: 8 %
Monocytes Absolute: 0.5 10*3/uL (ref 0.1–0.9)
Neutrophils Absolute: 5.4 10*3/uL (ref 1.4–7.0)
Neutrophils: 84 %
Platelets: 183 10*3/uL (ref 150–450)
RBC: 4.22 x10E6/uL (ref 4.14–5.80)
RDW: 14.7 % (ref 12.3–15.4)
WBC: 6.5 10*3/uL (ref 3.4–10.8)

## 2018-01-01 LAB — HEPATIC FUNCTION PANEL
ALT: 30 IU/L (ref 0–44)
AST: 34 IU/L (ref 0–40)
Albumin: 4 g/dL (ref 3.5–4.8)
Alkaline Phosphatase: 60 IU/L (ref 39–117)
Bilirubin Total: 0.6 mg/dL (ref 0.0–1.2)
Bilirubin, Direct: 0.16 mg/dL (ref 0.00–0.40)
TOTAL PROTEIN: 6.2 g/dL (ref 6.0–8.5)

## 2018-01-01 LAB — LIPID PANEL
Chol/HDL Ratio: 4.4 ratio (ref 0.0–5.0)
Cholesterol, Total: 179 mg/dL (ref 100–199)
HDL: 41 mg/dL (ref >39)
LDL CALC: 113 mg/dL — AB (ref 0–99)
Triglycerides: 125 mg/dL (ref 0–149)
VLDL CHOLESTEROL CAL: 25 mg/dL (ref 5–40)

## 2018-01-01 LAB — VITAMIN D 25 HYDROXY (VIT D DEFICIENCY, FRACTURES): VIT D 25 HYDROXY: 29.3 ng/mL — AB (ref 30.0–100.0)

## 2018-01-01 LAB — PSA, TOTAL AND FREE
PROSTATE SPECIFIC AG, SERUM: 0.2 ng/mL (ref 0.0–4.0)
PSA FREE: 0.04 ng/mL
PSA, Free Pct: 20 %

## 2018-01-26 ENCOUNTER — Other Ambulatory Visit: Payer: Self-pay | Admitting: Family Medicine

## 2018-01-30 ENCOUNTER — Other Ambulatory Visit: Payer: PPO

## 2018-01-30 DIAGNOSIS — E876 Hypokalemia: Secondary | ICD-10-CM

## 2018-01-30 DIAGNOSIS — E559 Vitamin D deficiency, unspecified: Secondary | ICD-10-CM | POA: Diagnosis not present

## 2018-01-30 DIAGNOSIS — E7800 Pure hypercholesterolemia, unspecified: Secondary | ICD-10-CM

## 2018-01-31 LAB — LIPID PANEL
CHOL/HDL RATIO: 4.5 ratio (ref 0.0–5.0)
Cholesterol, Total: 180 mg/dL (ref 100–199)
HDL: 40 mg/dL (ref >39)
LDL CALC: 108 mg/dL — AB (ref 0–99)
TRIGLYCERIDES: 158 mg/dL — AB (ref 0–149)
VLDL Cholesterol Cal: 32 mg/dL (ref 5–40)

## 2018-01-31 LAB — HEPATIC FUNCTION PANEL
ALBUMIN: 4.2 g/dL (ref 3.5–4.8)
ALT: 31 IU/L (ref 0–44)
AST: 26 IU/L (ref 0–40)
Alkaline Phosphatase: 64 IU/L (ref 39–117)
BILIRUBIN TOTAL: 0.3 mg/dL (ref 0.0–1.2)
BILIRUBIN, DIRECT: 0.11 mg/dL (ref 0.00–0.40)
Total Protein: 6.2 g/dL (ref 6.0–8.5)

## 2018-01-31 LAB — VITAMIN D 25 HYDROXY (VIT D DEFICIENCY, FRACTURES): Vit D, 25-Hydroxy: 30.4 ng/mL (ref 30.0–100.0)

## 2018-01-31 LAB — BMP8+EGFR
BUN/Creatinine Ratio: 14 (ref 10–24)
BUN: 18 mg/dL (ref 8–27)
CO2: 24 mmol/L (ref 20–29)
CREATININE: 1.27 mg/dL (ref 0.76–1.27)
Calcium: 9 mg/dL (ref 8.6–10.2)
Chloride: 102 mmol/L (ref 96–106)
GFR, EST AFRICAN AMERICAN: 65 mL/min/{1.73_m2} (ref >59)
GFR, EST NON AFRICAN AMERICAN: 56 mL/min/{1.73_m2} — AB (ref >59)
Glucose: 116 mg/dL — ABNORMAL HIGH (ref 65–99)
Potassium: 3.8 mmol/L (ref 3.5–5.2)
SODIUM: 141 mmol/L (ref 134–144)

## 2018-02-18 DIAGNOSIS — R911 Solitary pulmonary nodule: Secondary | ICD-10-CM | POA: Diagnosis not present

## 2018-02-18 DIAGNOSIS — C221 Intrahepatic bile duct carcinoma: Secondary | ICD-10-CM | POA: Diagnosis not present

## 2018-02-18 DIAGNOSIS — K319 Disease of stomach and duodenum, unspecified: Secondary | ICD-10-CM | POA: Diagnosis not present

## 2018-03-13 DIAGNOSIS — C221 Intrahepatic bile duct carcinoma: Secondary | ICD-10-CM | POA: Diagnosis not present

## 2018-03-13 DIAGNOSIS — N62 Hypertrophy of breast: Secondary | ICD-10-CM | POA: Diagnosis not present

## 2018-03-13 DIAGNOSIS — Z95828 Presence of other vascular implants and grafts: Secondary | ICD-10-CM | POA: Diagnosis not present

## 2018-03-13 DIAGNOSIS — R911 Solitary pulmonary nodule: Secondary | ICD-10-CM | POA: Diagnosis not present

## 2018-03-13 DIAGNOSIS — I2510 Atherosclerotic heart disease of native coronary artery without angina pectoris: Secondary | ICD-10-CM | POA: Diagnosis not present

## 2018-03-13 DIAGNOSIS — Z9049 Acquired absence of other specified parts of digestive tract: Secondary | ICD-10-CM | POA: Diagnosis not present

## 2018-04-01 DIAGNOSIS — C221 Intrahepatic bile duct carcinoma: Secondary | ICD-10-CM | POA: Diagnosis not present

## 2018-04-01 DIAGNOSIS — R911 Solitary pulmonary nodule: Secondary | ICD-10-CM | POA: Diagnosis not present

## 2018-04-08 ENCOUNTER — Encounter: Payer: Self-pay | Admitting: Family Medicine

## 2018-04-08 ENCOUNTER — Ambulatory Visit (INDEPENDENT_AMBULATORY_CARE_PROVIDER_SITE_OTHER): Payer: PPO | Admitting: Family Medicine

## 2018-04-08 VITALS — BP 133/80 | HR 80 | Temp 98.0°F | Ht 72.0 in | Wt 231.0 lb

## 2018-04-08 DIAGNOSIS — J209 Acute bronchitis, unspecified: Secondary | ICD-10-CM | POA: Diagnosis not present

## 2018-04-08 MED ORDER — CEFDINIR 300 MG PO CAPS: 300.0000 mg | ORAL_CAPSULE | Freq: Two times a day (BID) | ORAL | 0 refills | Status: DC

## 2018-04-08 NOTE — Progress Notes (Signed)
Subjective:    Patient ID: Grant Cummings., male    DOB: 03-15-1945, 73 y.o.   MRN: [id number]  HPI Patient here today for cough and congestion.  The patient comes in today because of cough congestion and some questionable fever that started 3 to 4 days ago and initially with a sore throat but that part is better.  His blood pressure slightly up and on repeat it was better.  He is not running any fever.  This patient's other significant history is that he has had a pulmonary embolus he has had liver cancer he has hyperlipidemia and hypertension.  He is also taking Eliquis.  Patient was in the hospital this week because of a spot that was found on his lung and he was having a PET scan with the option of doing a biopsy for the spot.  When the PET scan was done they came back and told the patient that the spot was much smaller and that they would not be doing a biopsy.  He is thankful for this.  He will get another scan in 2 to 3 months.  He is coughing up some sputum and it slightly colored.  He denies any trouble with his intestinal tract including nausea vomiting or diarrhea.   Patient Active Problem List   Diagnosis Date Noted  . 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 Blood type A- 05/08/2015  . Hernia of abdominal wall 05/08/2015  . Edema 11/22/2014  . BPH (benign prostatic hyperplasia) 03/22/2013  . Vitamin D deficiency 03/22/2013  . Hearing deficit 03/22/2013  . Liver cancer ([place])   . Hyperlipidemia   . Hypertension    Outpatient Encounter Medications as of 04/08/2018  Medication Sig  . amLODipine (NORVASC) 10 MG tablet TAKE ONE (1) TABLET EACH DAY  . atorvastatin (LIPITOR) 40 MG tablet TAKE ONE (1) TABLET EACH DAY  . cholecalciferol (VITAMIN D) 1000 UNITS tablet Take 1,000 Units by mouth daily.  Marland Kitchen ELIQUIS 5 MG TABS tablet TAKE ONE TABLET BY MOUTH TWICE DAILY.  Marland Kitchen Glucosamine-Chondroitin (GLUCOSAMINE CHONDR COMPLEX PO) Take 1 tablet by mouth daily.  .  hydrochlorothiazide (HYDRODIURIL) 25 MG tablet TAKE ONE (1) TABLET EACH DAY  . pantoprazole (PROTONIX) 40 MG tablet TAKE ONE (1) TABLET EACH DAY  . potassium chloride (K-DUR,KLOR-CON) 10 MEQ tablet TAKE ONE TABLET BY MOUTH DAILY  . [DISCONTINUED] potassium chloride (K-DUR) 10 MEQ tablet TAKE ONE (1) TABLET EACH DAY   No facility-administered encounter medications on file as of 04/08/2018.       Review of Systems  Constitutional: Positive for chills (occasional ). Negative for fever.  HENT: Positive for congestion. Sore throat: gone now.   Eyes: Negative.   Respiratory: Positive for cough (x 3-4 days).   Cardiovascular: Negative.   Gastrointestinal: Negative.   Endocrine: Negative.   Genitourinary: Negative.   Musculoskeletal: Negative.   Skin: Negative.   Allergic/Immunologic: Negative.   Neurological: Negative.   Hematological: Negative.   Psychiatric/Behavioral: Negative.        Objective:   Physical Exam Vitals signs and nursing note reviewed.  Constitutional:      General: He is not in acute distress.    Appearance: Normal appearance. He is well-developed.     Comments: Patient is pleasant and alert in no acute distress  HENT:     Head: Normocephalic and atraumatic.     Right Ear: Tympanic membrane, ear canal and external ear normal. There is no impacted cerumen.  Left Ear: Tympanic membrane, ear canal and external ear normal. There is no impacted cerumen.     Nose: Congestion present.     Mouth/Throat:     Mouth: Mucous membranes are moist.     Pharynx: Oropharynx is clear. No oropharyngeal exudate.  Eyes:     General: No scleral icterus.       Right eye: No discharge.        Left eye: No discharge.     Extraocular Movements: Extraocular movements intact.     Conjunctiva/sclera: Conjunctivae normal.     Pupils: Pupils are equal, round, and reactive to light.  Neck:     Musculoskeletal: Normal range of motion and neck supple.     Thyroid: No thyromegaly.      Trachea: No tracheal deviation.  Cardiovascular:     Rate and Rhythm: Normal rate and regular rhythm.     Heart sounds: Normal heart sounds.  Pulmonary:     Effort: Pulmonary effort is normal. No respiratory distress.     Breath sounds: Wheezing present. No rales.     Comments: Dry cough rare wheeze no rales no rhonchi Musculoskeletal: Normal range of motion.        General: No tenderness.  Lymphadenopathy:     Cervical: No cervical adenopathy.  Skin:    General: Skin is warm and dry.     Findings: No rash.  Neurological:     General: No focal deficit present.     Mental Status: He is alert and oriented to person, place, and time.     Cranial Nerves: Cranial nerve deficit present.     Deep Tendon Reflexes: Reflexes are normal and symmetric.  Psychiatric:        Mood and Affect: Mood normal.        Behavior: Behavior normal.        Thought Content: Thought content normal.        Judgment: Judgment normal.     BP (!) 151/82 (BP Location: Left Arm)   Pulse 80   Temp 98 F (36.7 C) (Oral)   Ht 6' (1.829 m)   Wt 231 lb (104.8 kg)   BMI 31.33 kg/m        Assessment & Plan:  1. Bronchitis with bronchospasm -Drink plenty of fluids, take Mucinex plain maximum strength twice daily with a large glass of water, use nasal saline -Take antibiotic as directed  Current Outpatient Medications on File Prior to Visit  Medication Sig Dispense Refill  . amLODipine (NORVASC) 10 MG tablet TAKE ONE (1) TABLET EACH DAY 90 tablet 3  . atorvastatin (LIPITOR) 40 MG tablet TAKE ONE (1) TABLET EACH DAY 90 tablet 3  . cholecalciferol (VITAMIN D) 1000 UNITS tablet Take 1,000 Units by mouth daily.    Marland Kitchen ELIQUIS 5 MG TABS tablet TAKE ONE TABLET BY MOUTH TWICE DAILY. 60 tablet 6  . Glucosamine-Chondroitin (GLUCOSAMINE CHONDR COMPLEX PO) Take 1 tablet by mouth daily.    . hydrochlorothiazide (HYDRODIURIL) 25 MG tablet TAKE ONE (1) TABLET EACH DAY 90 tablet 3  . pantoprazole (PROTONIX) 40 MG tablet  TAKE ONE (1) TABLET EACH DAY 90 tablet 3  . potassium chloride (K-DUR,KLOR-CON) 10 MEQ tablet TAKE ONE TABLET BY MOUTH DAILY 90 tablet 1   No current facility-administered medications on file prior to visit.    Patient Instructions  Take Mucinex maximum strength, blue and white in color, 1 twice daily with a large glass of water Use nasal saline 1 spray each  nostril 3-4 times daily Take Omnicef 300 mg twice daily with food Take Tylenol if needed for aches pains and fever Keep the house as cool as possible Use coolmist humidification in the home.   Arrie Senate MD

## 2018-04-08 NOTE — Patient Instructions (Signed)
Take Mucinex maximum strength, blue and white in color, 1 twice daily with a large glass of water Use nasal saline 1 spray each nostril 3-4 times daily Take Omnicef 300 mg twice daily with food Take Tylenol if needed for aches pains and fever Keep the house as cool as possible Use coolmist humidification in the home.

## 2018-06-01 ENCOUNTER — Other Ambulatory Visit: Payer: Self-pay | Admitting: Family Medicine

## 2018-06-01 DIAGNOSIS — I10 Essential (primary) hypertension: Secondary | ICD-10-CM

## 2018-07-01 ENCOUNTER — Ambulatory Visit: Payer: PPO | Admitting: Family Medicine

## 2018-07-28 ENCOUNTER — Telehealth: Payer: Self-pay | Admitting: Family Medicine

## 2018-07-28 DIAGNOSIS — I10 Essential (primary) hypertension: Secondary | ICD-10-CM

## 2018-07-28 DIAGNOSIS — E785 Hyperlipidemia, unspecified: Secondary | ICD-10-CM

## 2018-07-28 MED ORDER — PANTOPRAZOLE SODIUM 40 MG PO TBEC: DELAYED_RELEASE_TABLET | ORAL | 3 refills | Status: DC

## 2018-07-28 MED ORDER — ATORVASTATIN CALCIUM 40 MG PO TABS: ORAL_TABLET | ORAL | 3 refills | Status: DC

## 2018-07-28 MED ORDER — APIXABAN 5 MG PO TABS: 5.0000 mg | ORAL_TABLET | Freq: Two times a day (BID) | ORAL | 3 refills | Status: DC

## 2018-07-28 MED ORDER — POTASSIUM CHLORIDE CRYS ER 10 MEQ PO TBCR: 10.0000 meq | EXTENDED_RELEASE_TABLET | Freq: Every day | ORAL | 3 refills | Status: DC

## 2018-07-28 MED ORDER — AMLODIPINE BESYLATE 10 MG PO TABS: ORAL_TABLET | ORAL | 3 refills | Status: DC

## 2018-07-28 MED ORDER — HYDROCHLOROTHIAZIDE 25 MG PO TABS: ORAL_TABLET | ORAL | 3 refills | Status: DC

## 2018-07-28 NOTE — Telephone Encounter (Signed)
LMTCB  - [id number]

## 2018-07-28 NOTE — Telephone Encounter (Signed)
Pt called - switched to [place]

## 2018-08-12 ENCOUNTER — Other Ambulatory Visit: Payer: Self-pay

## 2018-08-13 ENCOUNTER — Ambulatory Visit (INDEPENDENT_AMBULATORY_CARE_PROVIDER_SITE_OTHER): Payer: PPO | Admitting: Family Medicine

## 2018-08-13 ENCOUNTER — Encounter: Payer: Self-pay | Admitting: Family Medicine

## 2018-08-13 VITALS — BP 139/78 | HR 69 | Temp 97.1°F | Ht 73.0 in | Wt 219.2 lb

## 2018-08-13 DIAGNOSIS — E559 Vitamin D deficiency, unspecified: Secondary | ICD-10-CM

## 2018-08-13 DIAGNOSIS — I10 Essential (primary) hypertension: Secondary | ICD-10-CM | POA: Diagnosis not present

## 2018-08-13 DIAGNOSIS — E782 Mixed hyperlipidemia: Secondary | ICD-10-CM | POA: Diagnosis not present

## 2018-08-13 DIAGNOSIS — N400 Enlarged prostate without lower urinary tract symptoms: Secondary | ICD-10-CM | POA: Diagnosis not present

## 2018-08-13 DIAGNOSIS — E785 Hyperlipidemia, unspecified: Secondary | ICD-10-CM | POA: Diagnosis not present

## 2018-08-13 MED ORDER — APIXABAN 5 MG PO TABS: 5.0000 mg | ORAL_TABLET | Freq: Two times a day (BID) | ORAL | 3 refills | Status: DC

## 2018-08-13 MED ORDER — POTASSIUM CHLORIDE CRYS ER 10 MEQ PO TBCR: 10.0000 meq | EXTENDED_RELEASE_TABLET | Freq: Every day | ORAL | 3 refills | Status: DC

## 2018-08-13 MED ORDER — AMLODIPINE BESYLATE 10 MG PO TABS: ORAL_TABLET | ORAL | 3 refills | Status: DC

## 2018-08-13 MED ORDER — ATORVASTATIN CALCIUM 40 MG PO TABS: ORAL_TABLET | ORAL | 3 refills | Status: DC

## 2018-08-13 MED ORDER — HYDROCHLOROTHIAZIDE 25 MG PO TABS: ORAL_TABLET | ORAL | 3 refills | Status: DC

## 2018-08-13 MED ORDER — PANTOPRAZOLE SODIUM 40 MG PO TBEC: DELAYED_RELEASE_TABLET | ORAL | 3 refills | Status: DC

## 2018-08-13 NOTE — Progress Notes (Signed)
BP 139/78   Pulse 69   Temp (!) 97.1 F (36.2 C) (Oral)   Ht _0  (1.854 m)   Wt 219 lb 3.2 oz (99.4 kg)   BMI 28.92 kg/m    Subjective:   Patient ID: Grant Altes., male    DOB: 1945/10/14, 73 y.o.   MRN: [id number]  HPI: Grant Troublefield. is a 73 y.o. male presenting on 08/13/2018 for Establish Care and Medical Management of Chronic Issues (check up of chronic medical conditions)   HPI Hypertension Patient is currently on amlodipine and hydrochlorothiazide, and their blood pressure today is 139/78. Patient denies any lightheadedness or dizziness. Patient denies headaches, blurred vision, chest pains, shortness of breath, or weakness. Denies any side effects from medication and is content with current medication.   Hyperlipidemia Patient is coming in for recheck of his hyperlipidemia. The patient is currently taking atorvastatin. They deny any issues with myalgias or history of liver damage from it. They deny any focal numbness or weakness or chest pain.   BPH recheck Patient is coming in for recheck on BPH.  Patient is not currently on any medications for it and denies it being a major issue.  He is just been getting checkups on it and getting PSAs on it and following that.  Patient is on Eliquis for history of a PE.  Relevant past medical, surgical, family and social history reviewed and updated as indicated. Interim medical history since our last visit reviewed. Allergies and medications reviewed and updated.  Review of Systems  Constitutional: Negative for chills and fever.  Eyes: Negative for visual disturbance.  Respiratory: Negative for shortness of breath and wheezing.   Cardiovascular: Negative for chest pain and leg swelling.  Musculoskeletal: Negative for back pain and gait problem.  Skin: Negative for rash.  Neurological: Negative for dizziness and weakness.  All other systems reviewed and are negative.   Per HPI unless specifically indicated above    Allergies as of 08/13/2018   No Known Allergies     Medication List       Accurate as of August 13, 2018  8:20 AM. If you have any questions, ask your nurse or doctor.        STOP taking these medications   cefdinir 300 MG capsule Commonly known as: OMNICEF Stopped by: Fransisca Kaufmann Korri Ask, MD     TAKE these medications   amLODipine 10 MG tablet Commonly known as: NORVASC TAKE ONE TABLET BY MOUTH DAILY.   apixaban 5 MG Tabs tablet Commonly known as: Eliquis Take 1 tablet (5 mg total) by mouth 2 (two) times daily.   atorvastatin 40 MG tablet Commonly known as: LIPITOR TAKE ONE (1) TABLET EACH DAY   cholecalciferol 1000 units tablet Commonly known as: VITAMIN D Take 1,000 Units by mouth daily.   GLUCOSAMINE CHONDR COMPLEX PO Take 1 tablet by mouth daily.   hydrochlorothiazide 25 MG tablet Commonly known as: HYDRODIURIL TAKE ONE TABLET BY MOUTH DAILY.   pantoprazole 40 MG tablet Commonly known as: PROTONIX TAKE ONE (1) TABLET EACH DAY   potassium chloride 10 MEQ tablet Commonly known as: K-DUR Take 1 tablet (10 mEq total) by mouth daily.        Objective:   BP 139/78   Pulse 69   Temp (!) 97.1 F (36.2 C) (Oral)   Ht _1  (1.854 m)   Wt 219 lb 3.2 oz (99.4 kg)   BMI 28.92 kg/m   Wt Readings from  Last 3 Encounters:  08/13/18 219 lb 3.2 oz (99.4 kg)  04/08/18 231 lb (104.8 kg)  12/29/17 233 lb (105.7 kg)    Physical Exam Vitals signs and nursing note reviewed.  Constitutional:      General: He is not in acute distress.    Appearance: He is well-developed. He is not diaphoretic.  Eyes:     General: No scleral icterus.    Conjunctiva/sclera: Conjunctivae normal.  Neck:     Musculoskeletal: Neck supple.     Thyroid: No thyromegaly.  Cardiovascular:     Rate and Rhythm: Normal rate and regular rhythm.     Heart sounds: Normal heart sounds. No murmur.  Pulmonary:     Effort: Pulmonary effort is normal. No respiratory distress.     Breath sounds:  Normal breath sounds. No wheezing.  Lymphadenopathy:     Cervical: No cervical adenopathy.  Skin:    General: Skin is warm and dry.     Findings: No rash.  Neurological:     Mental Status: He is alert and oriented to person, place, and time.     Coordination: Coordination normal.  Psychiatric:        Behavior: Behavior normal.       Assessment & Plan:   Problem List Items Addressed This Visit      Cardiovascular and Mediastinum   Hypertension   Relevant Medications   amLODipine (NORVASC) 10 MG tablet   hydrochlorothiazide (HYDRODIURIL) 25 MG tablet   atorvastatin (LIPITOR) 40 MG tablet   apixaban (ELIQUIS) 5 MG TABS tablet   Other Relevant Orders   CBC with Differential/Platelet (Completed)   CMP14+EGFR (Completed)     Genitourinary   BPH (benign prostatic hyperplasia) - Primary   Relevant Orders   PSA, total and free (Completed)     Other   Hyperlipidemia   Relevant Medications   amLODipine (NORVASC) 10 MG tablet   hydrochlorothiazide (HYDRODIURIL) 25 MG tablet   atorvastatin (LIPITOR) 40 MG tablet   apixaban (ELIQUIS) 5 MG TABS tablet   Other Relevant Orders   Lipid panel (Completed)   Vitamin D deficiency   Relevant Orders   VITAMIN D 25 Hydroxy (Vit-D Deficiency, Fractures) (Completed)      Continue current medication including amlodipine hydrochlorothiazide atorvastatin and Eliquis.  We will recheck blood work today. Follow up plan: Return in about 6 months (around 02/13/2019), or if symptoms worsen or fail to improve, for Hypertension and cholesterol .  Counseling provided for all of the vaccine components No orders of the defined types were placed in this encounter.   Caryl Pina, MD [facility] 08/13/2018, 8:20 AM

## 2018-08-14 LAB — CBC WITH DIFFERENTIAL/PLATELET
Basophils Absolute: 0 10*3/uL (ref 0.0–0.2)
Basos: 0 %
EOS (ABSOLUTE): 0.1 10*3/uL (ref 0.0–0.4)
Eos: 1 %
Hematocrit: 35.1 % — ABNORMAL LOW (ref 37.5–51.0)
Hemoglobin: 11.3 g/dL — ABNORMAL LOW (ref 13.0–17.7)
Immature Grans (Abs): 0 10*3/uL (ref 0.0–0.1)
Immature Granulocytes: 0 %
Lymphocytes Absolute: 0.5 10*3/uL — ABNORMAL LOW (ref 0.7–3.1)
Lymphs: 9 %
MCH: 26 pg — ABNORMAL LOW (ref 26.6–33.0)
MCHC: 32.2 g/dL (ref 31.5–35.7)
MCV: 81 fL (ref 79–97)
Monocytes Absolute: 0.5 10*3/uL (ref 0.1–0.9)
Monocytes: 8 %
Neutrophils Absolute: 4.5 10*3/uL (ref 1.4–7.0)
Neutrophils: 82 %
Platelets: 204 10*3/uL (ref 150–450)
RBC: 4.35 x10E6/uL (ref 4.14–5.80)
RDW: 14.8 % (ref 11.6–15.4)
WBC: 5.6 10*3/uL (ref 3.4–10.8)

## 2018-08-14 LAB — LIPID PANEL
Chol/HDL Ratio: 2.9 ratio (ref 0.0–5.0)
Cholesterol, Total: 121 mg/dL (ref 100–199)
HDL: 42 mg/dL (ref >39)
LDL Calculated: 66 mg/dL (ref 0–99)
Triglycerides: 66 mg/dL (ref 0–149)
VLDL Cholesterol Cal: 13 mg/dL (ref 5–40)

## 2018-08-14 LAB — CMP14+EGFR
ALT: 29 IU/L (ref 0–44)
AST: 27 IU/L (ref 0–40)
Albumin/Globulin Ratio: 2 (ref 1.2–2.2)
Albumin: 4.3 g/dL (ref 3.7–4.7)
Alkaline Phosphatase: 61 IU/L (ref 39–117)
BUN/Creatinine Ratio: 16 (ref 10–24)
BUN: 18 mg/dL (ref 8–27)
Bilirubin Total: 0.4 mg/dL (ref 0.0–1.2)
CO2: 24 mmol/L (ref 20–29)
Calcium: 9.3 mg/dL (ref 8.6–10.2)
Chloride: 102 mmol/L (ref 96–106)
Creatinine, Ser: 1.12 mg/dL (ref 0.76–1.27)
GFR calc Af Amer: 75 mL/min/{1.73_m2} (ref >59)
GFR calc non Af Amer: 65 mL/min/{1.73_m2} (ref >59)
Globulin, Total: 2.1 g/dL (ref 1.5–4.5)
Glucose: 110 mg/dL — ABNORMAL HIGH (ref 65–99)
Potassium: 4 mmol/L (ref 3.5–5.2)
Sodium: 141 mmol/L (ref 134–144)
Total Protein: 6.4 g/dL (ref 6.0–8.5)

## 2018-08-14 LAB — PSA, TOTAL AND FREE
PSA, Free Pct: 20 %
PSA, Free: 0.04 ng/mL
Prostate Specific Ag, Serum: 0.2 ng/mL (ref 0.0–4.0)

## 2018-08-14 LAB — VITAMIN D 25 HYDROXY (VIT D DEFICIENCY, FRACTURES): Vit D, 25-Hydroxy: 52.4 ng/mL (ref 30.0–100.0)

## 2018-09-14 DIAGNOSIS — K388 Other specified diseases of appendix: Secondary | ICD-10-CM | POA: Diagnosis not present

## 2018-09-14 DIAGNOSIS — D6489 Other specified anemias: Secondary | ICD-10-CM | POA: Diagnosis not present

## 2018-09-14 DIAGNOSIS — R918 Other nonspecific abnormal finding of lung field: Secondary | ICD-10-CM | POA: Diagnosis not present

## 2018-09-14 DIAGNOSIS — C221 Intrahepatic bile duct carcinoma: Secondary | ICD-10-CM | POA: Diagnosis not present

## 2018-09-25 DIAGNOSIS — K219 Gastro-esophageal reflux disease without esophagitis: Secondary | ICD-10-CM | POA: Diagnosis not present

## 2018-09-25 DIAGNOSIS — J61 Pneumoconiosis due to asbestos and other mineral fibers: Secondary | ICD-10-CM | POA: Diagnosis not present

## 2018-09-25 DIAGNOSIS — D120 Benign neoplasm of cecum: Secondary | ICD-10-CM | POA: Diagnosis not present

## 2018-09-25 DIAGNOSIS — Z9889 Other specified postprocedural states: Secondary | ICD-10-CM | POA: Diagnosis not present

## 2018-09-25 DIAGNOSIS — I10 Essential (primary) hypertension: Secondary | ICD-10-CM | POA: Diagnosis not present

## 2018-09-25 DIAGNOSIS — C221 Intrahepatic bile duct carcinoma: Secondary | ICD-10-CM | POA: Diagnosis not present

## 2018-09-25 DIAGNOSIS — K388 Other specified diseases of appendix: Secondary | ICD-10-CM | POA: Diagnosis not present

## 2018-09-25 DIAGNOSIS — K635 Polyp of colon: Secondary | ICD-10-CM | POA: Diagnosis not present

## 2018-09-25 DIAGNOSIS — D122 Benign neoplasm of ascending colon: Secondary | ICD-10-CM | POA: Diagnosis not present

## 2018-09-25 DIAGNOSIS — Z1211 Encounter for screening for malignant neoplasm of colon: Secondary | ICD-10-CM | POA: Diagnosis not present

## 2018-10-06 DIAGNOSIS — Z8505 Personal history of malignant neoplasm of liver: Secondary | ICD-10-CM | POA: Diagnosis not present

## 2018-10-06 DIAGNOSIS — K388 Other specified diseases of appendix: Secondary | ICD-10-CM | POA: Diagnosis not present

## 2018-10-06 DIAGNOSIS — Z01818 Encounter for other preprocedural examination: Secondary | ICD-10-CM | POA: Diagnosis not present

## 2018-10-06 DIAGNOSIS — Z86010 Personal history of colonic polyps: Secondary | ICD-10-CM | POA: Diagnosis not present

## 2018-10-15 DIAGNOSIS — Z01812 Encounter for preprocedural laboratory examination: Secondary | ICD-10-CM | POA: Diagnosis not present

## 2018-10-15 DIAGNOSIS — Z20828 Contact with and (suspected) exposure to other viral communicable diseases: Secondary | ICD-10-CM | POA: Diagnosis not present

## 2018-10-15 DIAGNOSIS — K388 Other specified diseases of appendix: Secondary | ICD-10-CM | POA: Diagnosis not present

## 2018-10-21 DIAGNOSIS — R911 Solitary pulmonary nodule: Secondary | ICD-10-CM | POA: Diagnosis not present

## 2018-10-21 DIAGNOSIS — K388 Other specified diseases of appendix: Secondary | ICD-10-CM | POA: Diagnosis not present

## 2018-10-21 DIAGNOSIS — Z8505 Personal history of malignant neoplasm of liver: Secondary | ICD-10-CM | POA: Diagnosis not present

## 2018-10-21 DIAGNOSIS — C181 Malignant neoplasm of appendix: Secondary | ICD-10-CM | POA: Diagnosis not present

## 2018-11-17 DIAGNOSIS — K388 Other specified diseases of appendix: Secondary | ICD-10-CM | POA: Diagnosis not present

## 2018-11-17 DIAGNOSIS — C221 Intrahepatic bile duct carcinoma: Secondary | ICD-10-CM | POA: Diagnosis not present

## 2018-12-16 DIAGNOSIS — I2699 Other pulmonary embolism without acute cor pulmonale: Secondary | ICD-10-CM | POA: Diagnosis not present

## 2018-12-16 DIAGNOSIS — Z7901 Long term (current) use of anticoagulants: Secondary | ICD-10-CM | POA: Diagnosis not present

## 2018-12-16 DIAGNOSIS — M6589 Other synovitis and tenosynovitis, multiple sites: Secondary | ICD-10-CM | POA: Diagnosis not present

## 2018-12-16 DIAGNOSIS — Z86711 Personal history of pulmonary embolism: Secondary | ICD-10-CM | POA: Diagnosis not present

## 2018-12-16 DIAGNOSIS — D6489 Other specified anemias: Secondary | ICD-10-CM | POA: Diagnosis not present

## 2018-12-16 DIAGNOSIS — Z08 Encounter for follow-up examination after completed treatment for malignant neoplasm: Secondary | ICD-10-CM | POA: Diagnosis not present

## 2018-12-16 DIAGNOSIS — C221 Intrahepatic bile duct carcinoma: Secondary | ICD-10-CM | POA: Diagnosis not present

## 2018-12-16 DIAGNOSIS — Z8509 Personal history of malignant neoplasm of other digestive organs: Secondary | ICD-10-CM | POA: Diagnosis not present

## 2018-12-16 DIAGNOSIS — R918 Other nonspecific abnormal finding of lung field: Secondary | ICD-10-CM | POA: Diagnosis not present

## 2018-12-17 ENCOUNTER — Ambulatory Visit (INDEPENDENT_AMBULATORY_CARE_PROVIDER_SITE_OTHER): Payer: PPO | Admitting: *Deleted

## 2018-12-17 DIAGNOSIS — Z0000 Encounter for general adult medical examination without abnormal findings: Secondary | ICD-10-CM | POA: Diagnosis not present

## 2018-12-17 NOTE — Patient Instructions (Signed)
Preventive Care 72 Years and Older, Male Preventive care refers to lifestyle choices and visits with your health care provider that can promote health and wellness. This includes:  A yearly physical exam. This is also called an annual well check.  Regular dental and eye exams.  Immunizations.  Screening for certain conditions.  Healthy lifestyle choices, such as diet and exercise. What can I expect for my preventive care visit? Physical exam Your health care provider will check:  Height and weight. These may be used to calculate body mass index (BMI), which is a measurement that tells if you are at a healthy weight.  Heart rate and blood pressure.  Your skin for abnormal spots. Counseling Your health care provider may ask you questions about:  Alcohol, tobacco, and drug use.  Emotional well-being.  Home and relationship well-being.  Sexual activity.  Eating habits.  History of falls.  Memory and ability to understand (cognition).  Work and work Statistician. What immunizations do I need?  Influenza (flu) vaccine  This is recommended every year. Tetanus, diphtheria, and pertussis (Tdap) vaccine  You may need a Td booster every 10 years. Varicella (chickenpox) vaccine  You may need this vaccine if you have not already been vaccinated. Zoster (shingles) vaccine  You may need this after age 50. Pneumococcal conjugate (PCV13) vaccine  One dose is recommended after age 24. Pneumococcal polysaccharide (PPSV23) vaccine  One dose is recommended after age 33. Measles, mumps, and rubella (MMR) vaccine  You may need at least one dose of MMR if you were born in 1957 or later. You may also need a second dose. Meningococcal conjugate (MenACWY) vaccine  You may need this if you have certain conditions. Hepatitis A vaccine  You may need this if you have certain conditions or if you travel or work in places where you may be exposed to hepatitis A. Hepatitis B vaccine   You may need this if you have certain conditions or if you travel or work in places where you may be exposed to hepatitis B. Haemophilus influenzae type b (Hib) vaccine  You may need this if you have certain conditions. You may receive vaccines as individual doses or as more than one vaccine together in one shot (combination vaccines). Talk with your health care provider about the risks and benefits of combination vaccines. What tests do I need? Blood tests  Lipid and cholesterol levels. These may be checked every 5 years, or more frequently depending on your overall health.  Hepatitis C test.  Hepatitis B test. Screening  Lung cancer screening. You may have this screening every year starting at age 74 if you have a 30-pack-year history of smoking and currently smoke or have quit within the past 15 years.  Colorectal cancer screening. All adults should have this screening starting at age 57 and continuing until age 54. Your health care provider may recommend screening at age 47 if you are at increased risk. You will have tests every 1-10 years, depending on your results and the type of screening test.  Prostate cancer screening. Recommendations will vary depending on your family history and other risks.  Diabetes screening. This is done by checking your blood sugar (glucose) after you have not eaten for a while (fasting). You may have this done every 1-3 years.  Abdominal aortic aneurysm (AAA) screening. You may need this if you are a current or former smoker.  Sexually transmitted disease (STD) testing. Follow these instructions at home: Eating and drinking  Eat  a diet that includes fresh fruits and vegetables, whole grains, lean protein, and low-fat dairy products. Limit your intake of foods with high amounts of sugar, saturated fats, and salt.  Take vitamin and mineral supplements as recommended by your health care provider.  Do not drink alcohol if your health care provider  tells you not to drink.  If you drink alcohol: ? Limit how much you have to 0-2 drinks a day. ? Be aware of how much alcohol is in your drink. In the [place] one drink equals one 12 oz bottle of beer (355 mL), one 5 oz glass of wine (148 mL), or one 1 oz glass of hard liquor (44 mL). Lifestyle  Take daily care of your teeth and gums.  Stay active. Exercise for at least 30 minutes on 5 or more days each week.  Do not use any products that contain nicotine or tobacco, such as cigarettes, e-cigarettes, and chewing tobacco. If you need help quitting, ask your health care provider.  If you are sexually active, practice safe sex. Use a condom or other form of protection to prevent STIs (sexually transmitted infections).  Talk with your health care provider about taking a low-dose aspirin or statin. What's next?  Visit your health care provider once a year for a well check visit.  Ask your health care provider how often you should have your eyes and teeth checked.  Stay up to date on all vaccines. This information is not intended to replace advice given to you by your health care provider. Make sure you discuss any questions you have with your health care provider. Document Released: 02/24/2015 Document Revised: 01/22/2018 Document Reviewed: 01/22/2018 Elsevier Patient Education  2020 [employer]  

## 2018-12-17 NOTE — Progress Notes (Signed)
MEDICARE ANNUAL WELLNESS VISIT  12/17/2018  Telephone Visit Disclaimer This Medicare AWV was conducted by telephone due to national recommendations for restrictions regarding the COVID-19 Pandemic (e.g. social distancing).  I verified, using two identifiers, that I am speaking with Grant Cummings. or their authorized healthcare agent. I discussed the limitations, risks, security, and privacy concerns of performing an evaluation and management service by telephone and the potential availability of an in-person appointment in the future. The patient expressed understanding and agreed to proceed.   Subjective:  Grant Desorbo. is a 73 y.o. male patient of Grant Cummings, Grant Kaufmann, MD who had a Medicare Annual Wellness Visit today via telephone. 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 he has 3 children. he reports that he is socially active and does interact with friends/family regularly. he is markedly physically active and enjoys spending time with his 5 grandsons, working on the cattle farm and traveling when they can.  Patient Care Team: Grant Cummings, Grant Kaufmann, MD as PCP - General (Family Medicine) Grant Oyster Estill Bamberg, MD as Consulting Physician (Specialist) Grant Cummings, Grant Staggers, MD as Referring Physician (Hematology and Oncology) Grant Cummings, [place] (Optometry) Grant Fam, MD as Consulting Physician (Ophthalmology) Grant Messier, MD as Referring Physician (Internal Medicine) Grant Heinz, MD as Referring Physician (Surgery)  Advanced Directives 12/17/2018 08/30/2017 12/09/2016 05/08/2015  Does Patient Have a Medical Advance Directive? Yes Yes Yes Yes  Type of Paramedic of Grant Cummings;Living will Healthcare Power of Attorney Living will;Healthcare Power of [place];Living will  Does patient want to make changes to medical advance directive? No - Patient declined No - Patient declined No - Patient  declined -  Copy of [place] in Chart? No - copy requested - No - copy requested No - copy requested  Would patient like information on creating a medical advance directive? - No - Patient declined - [facility] Utilization Over the Past 12 Months: # of hospitalizations or ER visits: 0 # of surgeries: 1  Review of Systems    Patient reports that his overall health is better compared to last year.  History obtained from chart review  Patient Reported Readings (BP, Pulse, CBG, Weight, etc) none  Pain Assessment Pain : No/denies pain     Current Medications & Allergies (verified) Allergies as of 12/17/2018   No Known Allergies     Medication List       Accurate as of December 17, 2018  9:47 AM. If you have any questions, ask your nurse or doctor.        STOP taking these medications   GLUCOSAMINE CHONDR COMPLEX PO     TAKE these medications   amLODipine 10 MG tablet Commonly known as: NORVASC TAKE ONE TABLET BY MOUTH DAILY.   apixaban 5 MG Tabs tablet Commonly known as: Eliquis Take 1 tablet (5 mg total) by mouth 2 (two) times daily.   atorvastatin 40 MG tablet Commonly known as: LIPITOR TAKE ONE (1) TABLET EACH DAY   cholecalciferol 1000 units tablet Commonly known as: VITAMIN D Take 1,000 Units by mouth daily.   hydrochlorothiazide 25 MG tablet Commonly known as: HYDRODIURIL TAKE ONE TABLET BY MOUTH DAILY.   pantoprazole 40 MG tablet Commonly known as: PROTONIX TAKE ONE (1) TABLET EACH DAY   potassium chloride 10 MEQ tablet Commonly known as: KLOR-CON Take 1 tablet (10 mEq total) by  mouth daily.       History (reviewed): Past Medical History:  Diagnosis Date  . Asbestos exposure   . Cataract   . Hyperlipidemia   . Hypertension   . 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 PE (pulmonary thromboembolism) ([place])   . 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 APPENDECTOMY     . CARPAL TUNNEL WITH CUBITAL TUNNEL Right   . CHOLECYSTECTOMY    . EYE SURGERY Left 2017   lens implant to correct stigmatism / cataract  . KNEE SURGERY     left  . partial liver removal     x 3  . RADIOFREQUENCY ABLATION LIVER TUMOR     with biopsy  - NEG   Family History  Problem Relation Age of Onset  . Alzheimer's disease Mother   . Cancer Mother 6       breast  . Breast cancer Mother   . Alzheimer's disease Father   . Cancer Father        lymph nodes- jaw  . Hypertension Father   . Heart disease Father        heart attack   . Cancer Sister        ovarian  . Hypertension Sister   . GI problems Sister   . Melanoma Daughter 18  . Clotting disorder Paternal Grandfather    Social History   Socioeconomic History  . Marital status: Married    Spouse name: Patsy  . Number of children: 3  . Years of education: 90  . Highest education level: Some college, no degree  Occupational History  . Occupation: retired  Scientific laboratory technician  . Financial resource strain: Not hard at all  . Food insecurity    Worry: Never true    Inability: Never true  . Transportation needs    Medical: No    Non-medical: No  Tobacco Use  . Smoking status: Passive Smoke Exposure - Never Smoker  . Smokeless tobacco: Never Used  Substance and Sexual Activity  . Alcohol use: No  . Drug use: No  . Sexual activity: Yes  Lifestyle  . Physical activity    Days per week: 4 days    Minutes per session: 40 min  . Stress: Not at all  Relationships  . Social connections    Talks on phone: More than three times a week    Gets together: More than three times a week    Attends religious service: More than 4 times per year    Active member of club or organization: Yes    Attends meetings of clubs or organizations: More than 4 times per year    Relationship status: Married  Other Topics Concern  . Not on file  Social History Narrative   Married and lives at home with his wife. They have 3 adult  children who live in the local area. He has 7 grandchildren and 2 foster grandchildren. He picks up 2 of the grandkids from school daily. He is very involved with his church and with his family. He has considered joining a gym but has not. He does stay busy at home. 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     Activities of Daily Living In your present state of health, do you have any difficulty performing the following activities: 12/17/2018  Hearing? 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 N  Comment wears glasses-gets eye exam every 2 years  Difficulty concentrating or making decisions? N  Walking or climbing stairs? N  Dressing or bathing? N  Doing errands, shopping? N  Preparing Food and eating ? N  Using the Toilet? N  In the past six months, have you accidently leaked urine? N  Do you have problems with loss of bowel control? N  Managing your Medications? N  Managing your Finances? N  Housekeeping or managing your Housekeeping? N  Some recent data might be hidden    Patient Education/ Literacy How often do you need to have someone help you when you read instructions, pamphlets, or other written materials from your doctor or pharmacy?: 1 - Never What is the last grade level you completed in school?: 1 year of college  Exercise Current Exercise Habits: Home exercise routine, Type of exercise: walking, Time (Minutes): 45, Frequency (Times/Week): 4, Weekly Exercise (Minutes/Week): 180, Intensity: Moderate, Exercise limited by: None identified  Diet Patient reports consuming 3 meals a day and 0 snack(s) a day Patient reports that his primary diet is: Regular Patient reports that she does have regular access to food.   Depression Screen PHQ 2/9 Scores 12/17/2018 08/13/2018 12/29/2017 12/15/2017 09/10/2017 08/29/2017 06/17/2017  PHQ - 2 Score 0 0 0 0 0 0 0     Fall Risk Fall Risk  12/17/2018 08/13/2018 12/29/2017 12/25/2017 12/15/2017  Falls in the past  year? 0 0 0 (No Data) 0  Comment - - - Has stairs to the basement and one step from garage to main house. All have rails. Laundry is on the main living floor.  -  Number falls in past yr: - - - - 0  Injury with Fall? - - - - -     Objective:  Grant Cummings. seemed alert and oriented and he participated appropriately during our telephone visit.  Blood Pressure Weight BMI  BP Readings from Last 3 Encounters:  08/13/18 139/78  04/08/18 133/80  12/29/17 130/82   Wt Readings from Last 3 Encounters:  08/13/18 219 lb 3.2 oz (99.4 kg)  04/08/18 231 lb (104.8 kg)  12/29/17 233 lb (105.7 kg)   BMI Readings from Last 1 Encounters:  08/13/18 28.92 kg/m    *Unable to obtain current vital signs, weight, and BMI due to telephone visit type  Hearing/Vision  . Lindy did not seem to have difficulty with hearing/understanding during the telephone conversation . Reports that he has not had a formal eye exam by an eye care professional within the past year . Reports that he has not had a formal hearing evaluation within the past year *Unable to fully assess hearing and vision during telephone visit type  Cognitive Function: 6CIT Screen 12/17/2018 12/15/2017  What Year? 0 points 0 points  What month? 0 points 0 points  What time? 0 points 0 points  Count back from 20 0 points 0 points  Months in reverse 0 points 0 points  Repeat phrase 0 points 0 points  Total Score 0 0   (Normal:0-7, Significant for Dysfunction: >8)  Normal Cognitive Function Screening: Yes   Immunization & Health Maintenance Record Immunization History  Administered Date(s) Administered  . Influenza, High Dose Seasonal PF 12/01/2015, 12/09/2016, 12/15/2017  . Influenza,inj,Quad PF,6+ Mos 12/21/2013, 11/22/2014  . Influenza-Unspecified 02/14/2012  . Pneumococcal Conjugate-13 11/22/2014  . Pneumococcal-Unspecified 12/06/2012  . Td 11/29/2016  . Tdap 11/29/2016    Health Maintenance  Topic Date Due  .  INFLUENZA  VACCINE  09/12/2018  . Hepatitis C Screening  11/30/2021 (Originally Feb 21, 1945)  . COLONOSCOPY  02/12/2019  . TETANUS/TDAP  11/30/2026  . PNA vac Low Risk Adult  Completed       Assessment  This is a routine wellness examination for Grant Cummings.Marland Kitchen  Health Maintenance: Due or Overdue Health Maintenance Due  Topic Date Due  . INFLUENZA VACCINE  09/12/2018    Grant Cummings. does not need a referral for Community Assistance: Care Management:   no Social Work:    no Prescription Assistance:  no Nutrition/Diabetes Education:  no   Plan:  Personalized Goals Goals Addressed            This Visit's Progress   . DIET - EAT MORE FRUITS AND VEGETABLES        Personalized Health Maintenance & Screening Recommendations  Influenza vaccine Advanced directives: has an advanced directive - a copy HAS NOT been provided. Shingles vaccine  Lung Cancer Screening Recommended: no (Low Dose CT Chest recommended if Age 56-80 years, 30 pack-year currently smoking OR have quit w/in past 15 years) Hepatitis C Screening recommended: no HIV Screening recommended: no  Advanced Directives: Written information was not prepared per patient's request.  Referrals & Orders No orders of the defined types were placed in this encounter.   Follow-up Plan . Follow-up with Grant Cummings, Grant Kaufmann, MD as planned . Consider Flu and Shingles vaccines . Bring a copy of your Advanced Directives in for our records   I have personally reviewed and noted the following in the patient's chart:   . Medical and social history . Use of alcohol, tobacco or illicit drugs  . Current medications and supplements . Functional ability and status . Nutritional status . Physical activity . Advanced directives . List of other physicians . Hospitalizations, surgeries, and ER visits in previous 12 months . Vitals . Screenings to include cognitive, depression, and falls . Referrals and appointments  In  addition, I have reviewed and discussed with Grant Cummings. certain preventive protocols, quality metrics, and best practice recommendations. A written personalized care plan for preventive services as well as general preventive health recommendations is available and can be mailed to the patient at his request.      Milas Hock, LPN  [id number]

## 2019-02-16 ENCOUNTER — Ambulatory Visit: Payer: PPO | Admitting: Family Medicine

## 2019-04-12 DIAGNOSIS — L570 Actinic keratosis: Secondary | ICD-10-CM | POA: Diagnosis not present

## 2019-06-10 DIAGNOSIS — M1712 Unilateral primary osteoarthritis, left knee: Secondary | ICD-10-CM | POA: Diagnosis not present

## 2019-06-14 ENCOUNTER — Other Ambulatory Visit: Payer: Self-pay | Admitting: Family Medicine

## 2019-06-14 DIAGNOSIS — I10 Essential (primary) hypertension: Secondary | ICD-10-CM

## 2019-07-28 ENCOUNTER — Other Ambulatory Visit: Payer: Self-pay | Admitting: Family Medicine

## 2019-07-28 DIAGNOSIS — I10 Essential (primary) hypertension: Secondary | ICD-10-CM

## 2019-07-29 ENCOUNTER — Other Ambulatory Visit: Payer: Self-pay

## 2019-07-29 MED ORDER — PANTOPRAZOLE SODIUM 40 MG PO TBEC: DELAYED_RELEASE_TABLET | ORAL | 0 refills | Status: DC

## 2019-08-02 ENCOUNTER — Other Ambulatory Visit: Payer: Self-pay | Admitting: Family Medicine

## 2019-08-17 DIAGNOSIS — H35033 Hypertensive retinopathy, bilateral: Secondary | ICD-10-CM | POA: Diagnosis not present

## 2019-08-17 DIAGNOSIS — H524 Presbyopia: Secondary | ICD-10-CM | POA: Diagnosis not present

## 2019-08-18 DIAGNOSIS — D485 Neoplasm of uncertain behavior of skin: Secondary | ICD-10-CM | POA: Diagnosis not present

## 2019-08-18 DIAGNOSIS — L570 Actinic keratosis: Secondary | ICD-10-CM | POA: Diagnosis not present

## 2019-08-18 DIAGNOSIS — D0439 Carcinoma in situ of skin of other parts of face: Secondary | ICD-10-CM | POA: Diagnosis not present

## 2019-09-02 DIAGNOSIS — C44329 Squamous cell carcinoma of skin of other parts of face: Secondary | ICD-10-CM | POA: Diagnosis not present

## 2019-09-20 ENCOUNTER — Other Ambulatory Visit: Payer: Self-pay | Admitting: Family Medicine

## 2019-09-20 ENCOUNTER — Telehealth: Payer: Self-pay | Admitting: Family Medicine

## 2019-09-20 DIAGNOSIS — I10 Essential (primary) hypertension: Secondary | ICD-10-CM

## 2019-09-20 DIAGNOSIS — E782 Mixed hyperlipidemia: Secondary | ICD-10-CM

## 2019-09-20 MED ORDER — PANTOPRAZOLE SODIUM 40 MG PO TBEC: DELAYED_RELEASE_TABLET | ORAL | 0 refills | Status: DC

## 2019-09-20 MED ORDER — ATORVASTATIN CALCIUM 40 MG PO TABS: ORAL_TABLET | ORAL | 0 refills | Status: DC

## 2019-09-20 MED ORDER — AMLODIPINE BESYLATE 10 MG PO TABS: ORAL_TABLET | ORAL | 0 refills | Status: DC

## 2019-09-20 MED ORDER — APIXABAN 5 MG PO TABS: 5.0000 mg | ORAL_TABLET | Freq: Two times a day (BID) | ORAL | 0 refills | Status: DC

## 2019-09-20 MED ORDER — POTASSIUM CHLORIDE CRYS ER 10 MEQ PO TBCR: 10.0000 meq | EXTENDED_RELEASE_TABLET | Freq: Every day | ORAL | 0 refills | Status: DC

## 2019-09-20 MED ORDER — HYDROCHLOROTHIAZIDE 25 MG PO TABS: 25.0000 mg | ORAL_TABLET | Freq: Every day | ORAL | 0 refills | Status: DC

## 2019-09-20 NOTE — Telephone Encounter (Signed)
  Prescription Request  09/20/2019  What is the name of the medication or equipment? All  Have you contacted your pharmacy to request a refill? (if applicable) Yes  Which pharmacy would you like this sent to? [facility], [place]  Pt needs refills on all of his meds asap. Says he will run out tomorrow. Was told by pharmacy that we denied refills on his Rx's because he needs to make an appt. Pt scheduled an appt to see Dr Warrick Parisian for a check up on 10/21/19 (Dr Dettingers 1st available appt). Can Dr Dettinger send in refills for his meds to last him until he can come in for his appt?  Patient notified that their request is being sent to the clinical staff for review and that they should receive a response within 2 business days.

## 2019-09-20 NOTE — Telephone Encounter (Signed)
30 day supply sent in and pt is aware.

## 2019-09-20 NOTE — Telephone Encounter (Signed)
Dettinger. NTBS LOV 08/13/18

## 2019-09-20 NOTE — Telephone Encounter (Signed)
Yes refill all non controlled medicines until visit

## 2019-09-20 NOTE — Telephone Encounter (Signed)
Is this ok as he hasn't been seen since 08/2018?

## 2019-09-21 NOTE — Telephone Encounter (Signed)
This was taken care of yesterday in telephone encounter.

## 2019-09-29 DIAGNOSIS — H52213 Irregular astigmatism, bilateral: Secondary | ICD-10-CM | POA: Diagnosis not present

## 2019-09-29 DIAGNOSIS — H25041 Posterior subcapsular polar age-related cataract, right eye: Secondary | ICD-10-CM | POA: Diagnosis not present

## 2019-09-29 DIAGNOSIS — H35363 Drusen (degenerative) of macula, bilateral: Secondary | ICD-10-CM | POA: Diagnosis not present

## 2019-09-29 DIAGNOSIS — H25011 Cortical age-related cataract, right eye: Secondary | ICD-10-CM | POA: Diagnosis not present

## 2019-09-29 DIAGNOSIS — D3131 Benign neoplasm of right choroid: Secondary | ICD-10-CM | POA: Diagnosis not present

## 2019-09-29 DIAGNOSIS — H2511 Age-related nuclear cataract, right eye: Secondary | ICD-10-CM | POA: Diagnosis not present

## 2019-09-29 DIAGNOSIS — H35033 Hypertensive retinopathy, bilateral: Secondary | ICD-10-CM | POA: Diagnosis not present

## 2019-10-12 DIAGNOSIS — H2511 Age-related nuclear cataract, right eye: Secondary | ICD-10-CM | POA: Diagnosis not present

## 2019-10-12 DIAGNOSIS — H25811 Combined forms of age-related cataract, right eye: Secondary | ICD-10-CM | POA: Diagnosis not present

## 2019-10-12 DIAGNOSIS — H52211 Irregular astigmatism, right eye: Secondary | ICD-10-CM | POA: Diagnosis not present

## 2019-10-12 DIAGNOSIS — H25011 Cortical age-related cataract, right eye: Secondary | ICD-10-CM | POA: Diagnosis not present

## 2019-10-12 DIAGNOSIS — H25041 Posterior subcapsular polar age-related cataract, right eye: Secondary | ICD-10-CM | POA: Diagnosis not present

## 2019-10-21 ENCOUNTER — Ambulatory Visit (INDEPENDENT_AMBULATORY_CARE_PROVIDER_SITE_OTHER): Payer: PPO | Admitting: Family Medicine

## 2019-10-21 ENCOUNTER — Other Ambulatory Visit: Payer: Self-pay

## 2019-10-21 ENCOUNTER — Encounter: Payer: Self-pay | Admitting: Family Medicine

## 2019-10-21 VITALS — BP 130/80 | HR 70 | Temp 98.5°F | Ht 73.0 in | Wt 224.0 lb

## 2019-10-21 DIAGNOSIS — K219 Gastro-esophageal reflux disease without esophagitis: Secondary | ICD-10-CM

## 2019-10-21 DIAGNOSIS — N400 Enlarged prostate without lower urinary tract symptoms: Secondary | ICD-10-CM

## 2019-10-21 DIAGNOSIS — I10 Essential (primary) hypertension: Secondary | ICD-10-CM | POA: Diagnosis not present

## 2019-10-21 DIAGNOSIS — E782 Mixed hyperlipidemia: Secondary | ICD-10-CM | POA: Diagnosis not present

## 2019-10-21 MED ORDER — ATORVASTATIN CALCIUM 40 MG PO TABS: ORAL_TABLET | ORAL | 3 refills | Status: DC

## 2019-10-21 MED ORDER — AMLODIPINE BESYLATE 10 MG PO TABS: ORAL_TABLET | ORAL | 3 refills | Status: DC

## 2019-10-21 MED ORDER — APIXABAN 5 MG PO TABS: 5.0000 mg | ORAL_TABLET | Freq: Two times a day (BID) | ORAL | 3 refills | Status: DC

## 2019-10-21 MED ORDER — POTASSIUM CHLORIDE CRYS ER 10 MEQ PO TBCR: 10.0000 meq | EXTENDED_RELEASE_TABLET | Freq: Every day | ORAL | 3 refills | Status: DC

## 2019-10-21 MED ORDER — PANTOPRAZOLE SODIUM 40 MG PO TBEC: DELAYED_RELEASE_TABLET | ORAL | 3 refills | Status: DC

## 2019-10-21 MED ORDER — HYDROCHLOROTHIAZIDE 25 MG PO TABS: 25.0000 mg | ORAL_TABLET | Freq: Every day | ORAL | 3 refills | Status: DC

## 2019-10-21 NOTE — Progress Notes (Signed)
BP 130/80   Pulse 70   Temp 98.5 F (36.9 C)   Ht _0  (1.854 m)   Wt 264 lb (119.7 kg)   SpO2 94%   BMI 34.83 kg/m    Subjective:   Patient ID: Grant Cummings., male    DOB: 07/18/1945, 74 y.o.   MRN: [id number]  HPI: Grant Cummings. is a 74 y.o. male presenting on 10/21/2019 for Medical Management of Chronic Issues and Hyperlipidemia   HPI Hypertension Patient is currently on hydrochlorothiazide and amlodipine, and their blood pressure today is 130/80. Patient denies any lightheadedness or dizziness. Patient denies headaches, blurred vision, chest pains, shortness of breath, or weakness. Denies any side effects from medication and is content with current medication.   Hyperlipidemia Patient is coming in for recheck of his hyperlipidemia. The patient is currently taking atorvastatin although he has not been taking it recently. They deny any issues with myalgias or history of liver damage from it. They deny any focal numbness or weakness or chest pain.   BPH Patient is coming in for recheck on BPH Symptoms: None currently Medication: Flomax Last PSA: 1 year ago, normal  Relevant past medical, surgical, family and social history reviewed and updated as indicated. Interim medical history since our last visit reviewed. Allergies and medications reviewed and updated.  Review of Systems  Constitutional: Negative for chills and fever.  Eyes: Negative for visual disturbance.  Respiratory: Negative for shortness of breath and wheezing.   Cardiovascular: Negative for chest pain and leg swelling.  Musculoskeletal: Negative for back pain and gait problem.  Skin: Negative for rash.  Neurological: Negative for dizziness, weakness and numbness.  All other systems reviewed and are negative.   Per HPI unless specifically indicated above   Allergies as of 10/21/2019   No Known Allergies     Medication List       Accurate as of October 21, 2019 10:02 AM. If you have any  questions, ask your nurse or doctor.        amLODipine 10 MG tablet Commonly known as: NORVASC TAKE ONE TABLET BY MOUTH DAILY.   apixaban 5 MG Tabs tablet Commonly known as: Eliquis Take 1 tablet (5 mg total) by mouth 2 (two) times daily.   atorvastatin 40 MG tablet Commonly known as: LIPITOR TAKE ONE (1) TABLET EACH DAY   cholecalciferol 1000 units tablet Commonly known as: VITAMIN D Take 1,000 Units by mouth daily.   hydrochlorothiazide 25 MG tablet Commonly known as: HYDRODIURIL Take 1 tablet (25 mg total) by mouth daily.   multivitamin tablet Take 1 tablet by mouth daily.   pantoprazole 40 MG tablet Commonly known as: PROTONIX TAKE ONE (1) TABLET EACH DAY.  Needs to be seen for further refills.   potassium chloride 10 MEQ tablet Commonly known as: KLOR-CON Take 1 tablet (10 mEq total) by mouth daily. (Needs to be seen before next refill)        Objective:   BP 130/80   Pulse 70   Temp 98.5 F (36.9 C)   Ht _1  (1.854 m)   Wt 264 lb (119.7 kg)   SpO2 94%   BMI 34.83 kg/m   Wt Readings from Last 3 Encounters:  10/21/19 264 lb (119.7 kg)  08/13/18 219 lb 3.2 oz (99.4 kg)  04/08/18 231 lb (104.8 kg)    Physical Exam Vitals and nursing note reviewed.  Constitutional:      General: He is not in acute  distress.    Appearance: He is well-developed. He is not diaphoretic.  Eyes:     General: No scleral icterus.    Conjunctiva/sclera: Conjunctivae normal.  Neck:     Thyroid: No thyromegaly.  Cardiovascular:     Rate and Rhythm: Normal rate and regular rhythm.     Heart sounds: Normal heart sounds. No murmur heard.   Pulmonary:     Effort: Pulmonary effort is normal. No respiratory distress.     Breath sounds: Normal breath sounds. No wheezing.  Musculoskeletal:        General: Normal range of motion.     Cervical back: Neck supple.  Lymphadenopathy:     Cervical: No cervical adenopathy.  Skin:    General: Skin is warm and dry.     Findings:  No rash.  Neurological:     Mental Status: He is alert and oriented to person, place, and time.     Coordination: Coordination normal.  Psychiatric:        Behavior: Behavior normal.       Assessment & Plan:   Problem List Items Addressed This Visit      Cardiovascular and Mediastinum   Hypertension - Primary   Relevant Medications   amLODipine (NORVASC) 10 MG tablet   apixaban (ELIQUIS) 5 MG TABS tablet   atorvastatin (LIPITOR) 40 MG tablet   hydrochlorothiazide (HYDRODIURIL) 25 MG tablet   potassium chloride (KLOR-CON) 10 MEQ tablet   Other Relevant Orders   CBC with Differential/Platelet   CMP14+EGFR     Genitourinary   BPH (benign prostatic hyperplasia)   Relevant Orders   PSA, total and free     Other   Hyperlipidemia   Relevant Medications   amLODipine (NORVASC) 10 MG tablet   apixaban (ELIQUIS) 5 MG TABS tablet   atorvastatin (LIPITOR) 40 MG tablet   hydrochlorothiazide (HYDRODIURIL) 25 MG tablet   Other Relevant Orders   Lipid panel    Other Visit Diagnoses    Gastroesophageal reflux disease without esophagitis       Relevant Medications   pantoprazole (PROTONIX) 40 MG tablet      Continue current medication, no change, will check blood work. Follow up plan: Return if symptoms worsen or fail to improve, for Hypertension and hyperlipidemia and BPH.  Counseling provided for all of the vaccine components No orders of the defined types were placed in this encounter.   Caryl Pina, MD [facility] 10/21/2019, 10:02 AM

## 2019-10-22 LAB — CMP14+EGFR
ALT: 40 IU/L (ref 0–44)
AST: 32 IU/L (ref 0–40)
Albumin/Globulin Ratio: 2 (ref 1.2–2.2)
Albumin: 4.6 g/dL (ref 3.7–4.7)
Alkaline Phosphatase: 65 IU/L (ref 48–121)
BUN/Creatinine Ratio: 14 (ref 10–24)
BUN: 16 mg/dL (ref 8–27)
Bilirubin Total: 0.6 mg/dL (ref 0.0–1.2)
CO2: 23 mmol/L (ref 20–29)
Calcium: 9.5 mg/dL (ref 8.6–10.2)
Chloride: 102 mmol/L (ref 96–106)
Creatinine, Ser: 1.16 mg/dL (ref 0.76–1.27)
GFR calc Af Amer: 72 mL/min/{1.73_m2} (ref >59)
GFR calc non Af Amer: 62 mL/min/{1.73_m2} (ref >59)
Globulin, Total: 2.3 g/dL (ref 1.5–4.5)
Glucose: 109 mg/dL — ABNORMAL HIGH (ref 65–99)
Potassium: 3.9 mmol/L (ref 3.5–5.2)
Sodium: 139 mmol/L (ref 134–144)
Total Protein: 6.9 g/dL (ref 6.0–8.5)

## 2019-10-22 LAB — PSA, TOTAL AND FREE
PSA, Free Pct: 20 %
PSA, Free: 0.04 ng/mL
Prostate Specific Ag, Serum: 0.2 ng/mL (ref 0.0–4.0)

## 2019-10-22 LAB — CBC WITH DIFFERENTIAL/PLATELET
Basophils Absolute: 0 10*3/uL (ref 0.0–0.2)
Basos: 0 %
EOS (ABSOLUTE): 0.1 10*3/uL (ref 0.0–0.4)
Eos: 2 %
Hematocrit: 44.1 % (ref 37.5–51.0)
Hemoglobin: 15.2 g/dL (ref 13.0–17.7)
Immature Grans (Abs): 0 10*3/uL (ref 0.0–0.1)
Immature Granulocytes: 0 %
Lymphocytes Absolute: 0.6 10*3/uL — ABNORMAL LOW (ref 0.7–3.1)
Lymphs: 11 %
MCH: 33.3 pg — ABNORMAL HIGH (ref 26.6–33.0)
MCHC: 34.5 g/dL (ref 31.5–35.7)
MCV: 97 fL (ref 79–97)
Monocytes Absolute: 0.6 10*3/uL (ref 0.1–0.9)
Monocytes: 10 %
Neutrophils Absolute: 4.5 10*3/uL (ref 1.4–7.0)
Neutrophils: 77 %
Platelets: 188 10*3/uL (ref 150–450)
RBC: 4.56 x10E6/uL (ref 4.14–5.80)
RDW: 13.1 % (ref 11.6–15.4)
WBC: 5.9 10*3/uL (ref 3.4–10.8)

## 2019-10-22 LAB — LIPID PANEL
Chol/HDL Ratio: 4.5 ratio (ref 0.0–5.0)
Cholesterol, Total: 194 mg/dL (ref 100–199)
HDL: 43 mg/dL (ref >39)
LDL Chol Calc (NIH): 124 mg/dL — ABNORMAL HIGH (ref 0–99)
Triglycerides: 151 mg/dL — ABNORMAL HIGH (ref 0–149)
VLDL Cholesterol Cal: 27 mg/dL (ref 5–40)

## 2019-12-20 DIAGNOSIS — K3189 Other diseases of stomach and duodenum: Secondary | ICD-10-CM | POA: Diagnosis not present

## 2019-12-20 DIAGNOSIS — C221 Intrahepatic bile duct carcinoma: Secondary | ICD-10-CM | POA: Diagnosis not present

## 2019-12-20 DIAGNOSIS — Z86711 Personal history of pulmonary embolism: Secondary | ICD-10-CM | POA: Diagnosis not present

## 2019-12-20 DIAGNOSIS — K388 Other specified diseases of appendix: Secondary | ICD-10-CM | POA: Diagnosis not present

## 2019-12-20 DIAGNOSIS — R918 Other nonspecific abnormal finding of lung field: Secondary | ICD-10-CM | POA: Diagnosis not present

## 2019-12-20 DIAGNOSIS — Z7901 Long term (current) use of anticoagulants: Secondary | ICD-10-CM | POA: Diagnosis not present

## 2019-12-21 DIAGNOSIS — C4322 Malignant melanoma of left ear and external auricular canal: Secondary | ICD-10-CM | POA: Diagnosis not present

## 2019-12-21 DIAGNOSIS — D485 Neoplasm of uncertain behavior of skin: Secondary | ICD-10-CM | POA: Diagnosis not present

## 2019-12-21 DIAGNOSIS — L570 Actinic keratosis: Secondary | ICD-10-CM | POA: Diagnosis not present

## 2020-01-17 DIAGNOSIS — D0322 Melanoma in situ of left ear and external auricular canal: Secondary | ICD-10-CM | POA: Diagnosis not present

## 2020-01-17 DIAGNOSIS — C4322 Malignant melanoma of left ear and external auricular canal: Secondary | ICD-10-CM | POA: Diagnosis not present

## 2020-01-17 DIAGNOSIS — L820 Inflamed seborrheic keratosis: Secondary | ICD-10-CM | POA: Diagnosis not present

## 2020-01-17 HISTORY — PX: MELANOMA EXCISION: SHX5266

## 2020-01-19 ENCOUNTER — Ambulatory Visit (INDEPENDENT_AMBULATORY_CARE_PROVIDER_SITE_OTHER): Payer: PPO | Admitting: *Deleted

## 2020-01-19 DIAGNOSIS — Z0000 Encounter for general adult medical examination without abnormal findings: Secondary | ICD-10-CM

## 2020-01-19 NOTE — Progress Notes (Addendum)
MEDICARE ANNUAL WELLNESS VISIT  01/19/2020  Telephone Visit Disclaimer This Medicare AWV was conducted by telephone due to national recommendations for restrictions regarding the COVID-19 Pandemic (e.g. social distancing).  I verified, using two identifiers, that I am speaking with Grant Cummings. or their authorized healthcare agent. I discussed the limitations, risks, security, and privacy concerns of performing an evaluation and management service by telephone and the potential availability of an in-person appointment in the future. The patient expressed understanding and agreed to proceed.  Location of Patient: Home and wife Grant Cummings was present for the call Location of Provider (nurse):  [facility]  Subjective:    Grant Cummings. is a 74 y.o. male patient of Dettinger, Fransisca Kaufmann, MD who had a Medicare Annual Wellness Visit today via telephone. [name] is Retired and lives with their spouse. 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  He has 3 children. He reports that he is socially active and does interact with friends/family regularly. He has some church involvement. He does not have any pets in the home. Falls hazards discussed with patient. He is minimally physically active and enjoys hunting, fishing, spending time with grandchildren, and walking.  Patient Care Team: Dettinger, Fransisca Kaufmann, MD as PCP - General (Family Medicine) Derrill Kay, MD as Consulting Physician (Specialist) Jimmy Footman, Meredith Staggers, MD as Referring Physician (Hematology and Oncology) Jyl Heinz, MD as Referring Physician (Surgery)  Dr. Beryle Lathe Dr. Rosana Hoes- Opthamology  Advanced Directives 01/19/2020 12/17/2018 08/30/2017 12/09/2016 05/08/2015  Does Patient Have a Medical Advance Directive? Yes Yes Yes Yes Yes  Type of Paramedic of [place];Living will [place];Living will Healthcare Power of Attorney Living will;Healthcare Power  of [place];Living will  Does patient want to make changes to medical advance directive? No - Patient declined No - Patient declined No - Patient declined No - Patient declined -  Copy of [place] in Chart? No - copy requested No - copy requested - No - copy requested No - copy requested  Would patient like information on creating a medical advance directive? - - No - Patient declined - [facility] Fall Utilization Over the Past 12 Months: # of hospitalizations or ER visits: 0 # of surgeries: 1  Review of Systems    Patient reports that his overall health is better compared to last year.  History obtained from spouse, chart review and the patient  Patient Reported Readings (BP, Pulse, CBG, Weight, etc) none  Pain Assessment Pain : No/denies pain     Current Medications & Allergies (verified) Allergies as of 01/19/2020      Reactions   Nsaids Other (See Comments)   Stomach bleed from radiation   Other       Medication List       Accurate as of January 19, 2020  9:39 AM. If you have any questions, ask your nurse or doctor.        amLODipine 10 MG tablet Commonly known as: NORVASC TAKE ONE TABLET BY MOUTH DAILY.   apixaban 5 MG Tabs tablet Commonly known as: Eliquis Take 1 tablet (5 mg total) by mouth 2 (two) times daily.   atorvastatin 40 MG tablet Commonly known as: LIPITOR TAKE ONE (1) TABLET EACH DAY   cholecalciferol 1000 units tablet Commonly known as: VITAMIN D Take 1,000 Units by mouth daily.   hydrochlorothiazide 25 MG tablet Commonly known  as: HYDRODIURIL Take 1 tablet (25 mg total) by mouth daily.   multivitamin tablet Take 1 tablet by mouth daily.   pantoprazole 40 MG tablet Commonly known as: PROTONIX TAKE ONE (1) TABLET EACH DAY.   potassium chloride 10 MEQ tablet Commonly known as: KLOR-CON Take 1 tablet (10 mEq total) by mouth daily.       History (reviewed): Past Medical History:   Diagnosis Date  . Asbestos exposure   . Cataract   . Hyperlipidemia   . Hypertension   . 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 PE (pulmonary thromboembolism) ([place])   . 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 APPENDECTOMY    . CARPAL TUNNEL WITH CUBITAL TUNNEL Right   . CHOLECYSTECTOMY    . EYE SURGERY Left 2017   lens implant to correct stigmatism / cataract  . KNEE SURGERY     left  . partial liver removal     x 3  . 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 Alzheimer's disease Mother   . Cancer Mother 60       breast  . Breast cancer Mother   . Alzheimer's disease Father   . Cancer Father        lymph nodes- jaw  . Hypertension Father   . Heart disease Father        heart attack   . Cancer Sister        ovarian  . Hypertension Sister   . GI problems Sister   . Melanoma Daughter 34  . Clotting disorder Paternal Grandfather    Social History   Socioeconomic History  . Marital status: Married    Spouse name: Patsy  . Number of children: 3  . Years of education: 21  . Highest education level: Some college, no degree  Occupational History  . Occupation: retired    Comment: Print production planner  . Occupation: Farm  Tobacco Use  . Smoking status: Passive Smoke Exposure - Never Smoker  . Smokeless tobacco: Never Used  Vaping Use  . Vaping Use: Never used  Substance and Sexual Activity  . Alcohol use: No  . Drug use: No  . Sexual activity: Yes  Other Topics Concern  . Not on file  Social History Narrative   Married and lives at home with his wife. They have 3 adult children who live in the local area. He has 7 grandchildren and 2 foster grandchildren. He picks up 2 of the grandkids from school daily. He is very involved with his church and with his family. He has considered joining a gym but has not. He does  stay busy at home. 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    Social Determinants of Health   Financial Resource Strain:   . Difficulty of Paying Living Expenses: Not on file  Food Insecurity:   . Worried About Charity fundraiser in the Last Year: Not on file  . Ran Out of Food in the Last Year: Not on file  Transportation Needs:   . Lack of Transportation (Medical): Not on file  . Lack of Transportation (Non-Medical): Not on file  Physical Activity:   . Days of Exercise per Week: Not on file  . Minutes of Exercise per Session: Not on file  Stress:   . Feeling of Stress : Not on file  Social Connections:   . Frequency of Communication with Friends and Family: Not on file  . Frequency of Social Gatherings with Friends and Family: Not on file  . Attends Religious Services: Not on file  . Active Member of Clubs or Organizations: Not on file  . Attends Archivist Meetings: Not on file  . Marital Status: Not on file    Activities of Daily Living In your present state of health, do you have any difficulty performing the following activities: 01/19/2020  Hearing? Y  Comment Wears hearing aids  Vision? N  Comment Has implants  Difficulty concentrating or making decisions? N  Walking or climbing stairs? N  Dressing or bathing? N  Doing errands, shopping? N  Preparing Food and eating ? N  Using the Toilet? N  In the past six months, have you accidently leaked urine? N  Do you have problems with loss of bowel control? N  Managing your Medications? N  Managing your Finances? N  Housekeeping or managing your Housekeeping? N  Some recent data might be hidden    Patient Education/ Literacy How often do you need to have someone help you when you read instructions, pamphlets, or other written materials from your doctor or pharmacy?: 1 - Never What is the last grade level you completed in school?: Some College  Exercise Current Exercise Habits: Home exercise  routine  Diet Patient reports consuming 3 meals a day and 1 snack(s) a day Patient reports that his primary diet is: Regular Patient reports that she does have regular access to food.   Depression Screen PHQ 2/9 Scores 01/19/2020 10/21/2019 12/17/2018 08/13/2018 12/29/2017 12/15/2017 09/10/2017  PHQ - 2 Score 0 0 0 0 0 0 0     Fall Risk Fall Risk  01/19/2020 10/21/2019 12/17/2018 08/13/2018 12/29/2017  Falls in the past year? 0 1 0 0 0  Comment - - - - -  Number falls in past yr: - 0 - - -  Injury with Fall? - 0 - - -  Risk for fall due to : - Impaired balance/gait - - -  Follow up - Falls evaluation completed - - -     Objective:  Grant Cummings. seemed alert and oriented and he participated appropriately during our telephone visit.  Blood Pressure Weight BMI  BP Readings from Last 3 Encounters:  10/21/19 130/80  08/13/18 139/78  04/08/18 133/80   Wt Readings from Last 3 Encounters:  10/21/19 224 lb (101.6 kg)  08/13/18 219 lb 3.2 oz (99.4 kg)  04/08/18 231 lb (104.8 kg)   BMI Readings from Last 1 Encounters:  10/21/19 29.55 kg/m    *Unable to obtain current vital signs, weight, and BMI due to telephone visit type  Hearing/Vision  . Lenin did  seem to have difficulty with hearing/understanding during the telephone conversation. He does wear hearing aids and his wife was present to help with the call.  . Reports that he has had a formal eye exam by an eye care professional within the past year . Reports that he has not had a formal hearing evaluation within the past year *Unable to fully assess hearing and vision during telephone visit type  Cognitive Function: 6CIT Screen 01/19/2020 12/17/2018 12/15/2017  What Year? 0 points 0 points 0 points  What month? 0 points 0 points 0 points  What time? 0 points 0 points 0 points  Count back from  20 0 points 0 points 0 points  Months in reverse 0 points 0 points 0 points  Repeat phrase 0 points 0 points 0 points  Total Score 0 0 0    (Normal:0-7, Significant for Dysfunction: >8)  Normal Cognitive Function Screening: Yes   Immunization & Health Maintenance Record Immunization History  Administered Date(s) Administered  . Influenza, High Dose Seasonal PF 12/01/2015, 12/09/2016, 12/15/2017, 01/02/2019  . Influenza,inj,Quad PF,6+ Mos 12/21/2013, 11/22/2014  . Influenza-Unspecified 02/14/2012  . Moderna SARS-COVID-2 Vaccination 03/05/2019, 04/05/2019  . Pneumococcal Conjugate-13 11/22/2014  . Pneumococcal Polysaccharide-23 12/06/2012  . Td 11/29/2016  . Tdap 11/29/2016    Health Maintenance  Topic Date Due  . INFLUENZA VACCINE  09/12/2019  . Hepatitis C Screening  11/30/2021 (Originally 11/04/1945)  . TETANUS/TDAP  11/30/2026  . COLONOSCOPY  10/26/2028  . COVID-19 Vaccine  Completed  . PNA vac Low Risk Adult  Completed       Assessment  This is a routine wellness examination for Grant CummingsMarland Kitchen  Health Maintenance: Due or Overdue Health Maintenance Due  Topic Date Due  . INFLUENZA VACCINE  09/12/2019    Grant Cummings. does not need a referral for Community Assistance: Care Management:   no Social Work:    no Prescription Assistance:  no Nutrition/Diabetes Education:  no   Plan:  Personalized Goals Goals Addressed            This Visit's Progress   . AWV       01/19/2020 AWV Goal: Fall Prevention  . Over the next year, patient will decrease their risk for falls by: o Using assistive devices, such as a cane or walker, as needed o Identifying fall risks within their home and correcting them by: - Removing throw rugs - Adding handrails to stairs or ramps - Removing clutter and keeping a clear pathway throughout the home - Increasing light, especially at night - Adding shower handles/bars - Raising toilet seat o Identifying potential personal risk factors for falls: - Medication side effects - Incontinence/urgency - Vestibular dysfunction - Hearing loss - Musculoskeletal  disorders - Neurological disorders - Orthostatic hypotension  01/19/2020 AWV Goal: Exercise for General Health   Patient will verbalize understanding of the benefits of increased physical activity:  Exercising regularly is important. It will improve your overall fitness, flexibility, and endurance.  Regular exercise also will improve your overall health. It can help you control your weight, reduce stress, and improve your bone density.  Over the next year, patient will increase physical activity as tolerated with a goal of at least 150 minutes of moderate physical activity per week.   You can tell that you are exercising at a moderate intensity if your heart starts beating faster and you start breathing faster but can still hold a conversation.  Moderate-intensity exercise ideas include:  Walking 1 mile (1.6 km) in about 15 minutes  Biking  Hiking  Golfing  Dancing  Water aerobics  Patient will verbalize understanding of everyday activities that increase physical activity by providing examples like the following: ? Yard work, such as: ? Pushing a Conservation officer, nature ? Raking and bagging leaves ? Washing your car ? Pushing a stroller ? Shoveling snow ? Gardening ? Washing windows or floors  Patient will be able to explain general safety guidelines for exercising:   Before you start a new exercise program, talk with your health care provider.  Do not exercise so much that you hurt yourself, feel dizzy, or get very  short of breath.  Wear comfortable clothes and wear shoes with good support.  Drink plenty of water while you exercise to prevent dehydration or heat stroke.  Work out until your breathing and your heartbeat get faster.       Personalized Health Maintenance & Screening Recommendations  Influenza vaccine covid booster, and shingles vaccine  Lung Cancer Screening Recommended: no (Low Dose CT Chest recommended if Age 24-80 years, 30 pack-year currently smoking  OR have quit w/in past 15 years) Hepatitis C Screening recommended: no HIV Screening recommended: no  Advanced Directives: Written information was not prepared per patient's request.  Referrals & Orders No orders of the defined types were placed in this encounter.   Follow-up Plan . Follow-up with Dettinger, Fransisca Kaufmann, MD as planned . Schedule follow up with Dr. Warrick Parisian . Schedule COVID Booster  . Schedule Flu Vaccine  . AVS printed and mailed to patient.   I have personally reviewed and noted the following in the patient's chart:   . Medical and social history . Use of alcohol, tobacco or illicit drugs  . Current medications and supplements . Functional ability and status . Nutritional status . Physical activity . Advanced directives . List of other physicians . Hospitalizations, surgeries, and ER visits in previous 12 months . Vitals . Screenings to include cognitive, depression, and falls . Referrals and appointments  In addition, I have reviewed and discussed with Grant Cummings. certain preventive protocols, quality metrics, and best practice recommendations. A written personalized care plan for preventive services as well as general preventive health recommendations is available and can be mailed to the patient at his request.      Lynnea Ferrier, LPN  [id number]

## 2020-01-19 NOTE — Patient Instructions (Signed)
[facility] Maintenance Summary and Written Plan of Care  Grant Cummings ,  Thank you for allowing me to perform your Medicare Annual Wellness Visit and for your ongoing commitment to your health.   Health Maintenance & Immunization History Health Maintenance  Topic Date Due  . INFLUENZA VACCINE  09/12/2019  . Hepatitis C Screening  11/30/2021 (Originally 08/07/1945)  . TETANUS/TDAP  11/30/2026  . COLONOSCOPY  10/26/2028  . COVID-19 Vaccine  Completed  . PNA vac Low Risk Adult  Completed   Immunization History  Administered Date(s) Administered  . Influenza, High Dose Seasonal PF 12/01/2015, 12/09/2016, 12/15/2017, 01/02/2019  . Influenza,inj,Quad PF,6+ Mos 12/21/2013, 11/22/2014  . Influenza-Unspecified 02/14/2012  . Moderna SARS-COVID-2 Vaccination 03/05/2019, 04/05/2019  . Pneumococcal Conjugate-13 11/22/2014  . Pneumococcal Polysaccharide-23 12/06/2012  . Td 11/29/2016  . Tdap 11/29/2016    These are the patient goals that we discussed: Goals Addressed            This Visit's Progress   . AWV       01/19/2020 AWV Goal: Fall Prevention  . Over the next year, patient will decrease their risk for falls by: o Using assistive devices, such as a cane or walker, as needed o Identifying fall risks within their home and correcting them by: - Removing throw rugs - Adding handrails to stairs or ramps - Removing clutter and keeping a clear pathway throughout the home - Increasing light, especially at night - Adding shower handles/bars - Raising toilet seat o Identifying potential personal risk factors for falls: - Medication side effects - Incontinence/urgency - Vestibular dysfunction - Hearing loss - Musculoskeletal disorders - Neurological disorders - Orthostatic hypotension  01/19/2020 AWV Goal: Exercise for General Health   Patient will verbalize understanding of the benefits of increased physical activity:  Exercising regularly is  important. It will improve your overall fitness, flexibility, and endurance.  Regular exercise also will improve your overall health. It can help you control your weight, reduce stress, and improve your bone density.  Over the next year, patient will increase physical activity as tolerated with a goal of at least 150 minutes of moderate physical activity per week.   You can tell that you are exercising at a moderate intensity if your heart starts beating faster and you start breathing faster but can still hold a conversation.  Moderate-intensity exercise ideas include:  Walking 1 mile (1.6 km) in about 15 minutes  Biking  Hiking  Golfing  Dancing  Water aerobics  Patient will verbalize understanding of everyday activities that increase physical activity by providing examples like the following: ? Yard work, such as: ? Pushing a Conservation officer, nature ? Raking and bagging leaves ? Washing your car ? Pushing a stroller ? Shoveling snow ? Gardening ? Washing windows or floors  Patient will be able to explain general safety guidelines for exercising:   Before you start a new exercise program, talk with your health care provider.  Do not exercise so much that you hurt yourself, feel dizzy, or get very short of breath.  Wear comfortable clothes and wear shoes with good support.  Drink plenty of water while you exercise to prevent dehydration or heat stroke.  Work out until your breathing and your heartbeat get faster.         This is a list of Health Maintenance Items that are overdue or due now: Health Maintenance Due  Topic Date Due  . INFLUENZA VACCINE  09/12/2019  Orders/Referrals Placed Today: No orders of the defined types were placed in this encounter.  (Contact our referral department at [phone] if you have not spoken with someone about your referral appointment within the next 5 days)    Follow-up Plan . Follow-up with Dettinger, Fransisca Kaufmann, MD as  planned . Schedule follow up with Dr. Warrick Parisian . Schedule COVID Booster  . Schedule Flu Vaccine  . AVS printed and mailed to patient.

## 2020-01-20 ENCOUNTER — Telehealth: Payer: Self-pay

## 2020-01-21 ENCOUNTER — Other Ambulatory Visit: Payer: Self-pay

## 2020-01-21 ENCOUNTER — Ambulatory Visit (INDEPENDENT_AMBULATORY_CARE_PROVIDER_SITE_OTHER): Payer: PPO

## 2020-01-21 DIAGNOSIS — Z23 Encounter for immunization: Secondary | ICD-10-CM | POA: Diagnosis not present

## 2020-02-02 DIAGNOSIS — D0322 Melanoma in situ of left ear and external auricular canal: Secondary | ICD-10-CM | POA: Diagnosis not present

## 2020-02-02 DIAGNOSIS — D0359 Melanoma in situ of other part of trunk: Secondary | ICD-10-CM | POA: Diagnosis not present

## 2020-03-02 DIAGNOSIS — L905 Scar conditions and fibrosis of skin: Secondary | ICD-10-CM | POA: Diagnosis not present

## 2020-03-02 DIAGNOSIS — D0322 Melanoma in situ of left ear and external auricular canal: Secondary | ICD-10-CM | POA: Diagnosis not present

## 2020-03-02 DIAGNOSIS — L989 Disorder of the skin and subcutaneous tissue, unspecified: Secondary | ICD-10-CM | POA: Diagnosis not present

## 2020-03-27 DIAGNOSIS — H903 Sensorineural hearing loss, bilateral: Secondary | ICD-10-CM | POA: Diagnosis not present

## 2020-03-27 DIAGNOSIS — H9313 Tinnitus, bilateral: Secondary | ICD-10-CM | POA: Diagnosis not present

## 2020-04-04 DIAGNOSIS — K3189 Other diseases of stomach and duodenum: Secondary | ICD-10-CM | POA: Diagnosis not present

## 2020-04-04 DIAGNOSIS — K317 Polyp of stomach and duodenum: Secondary | ICD-10-CM | POA: Diagnosis not present

## 2020-04-18 IMAGING — CT CT ANGIO CHEST
2 of 6 series · 17 of 46 positions shown · IV contrast (Isovue)
Comparison: PET-CT March 08, 2010 and chest radiograph July 24, 2014

CLINICAL DATA: Shortness of breath.  History hepatic carcinoma

EXAM:
CT ANGIOGRAPHY CHEST WITH CONTRAST
TECHNIQUE: Multidetector CT imaging of the chest was performed using the
standard protocol during bolus administration of intravenous
contrast. Multiplanar CT image reconstructions and MIPs were
obtained to evaluate the vascular anatomy.
CONTRAST:  80 mL T6NOOQ-MRG IOPAMIDOL (T6NOOQ-MRG) INJECTION 76%

[Series 5: thins · axial · 0.91mm/px · z∈[-112,+150]mm · 14 of 287 slices shown]
[im 13/287  lung]
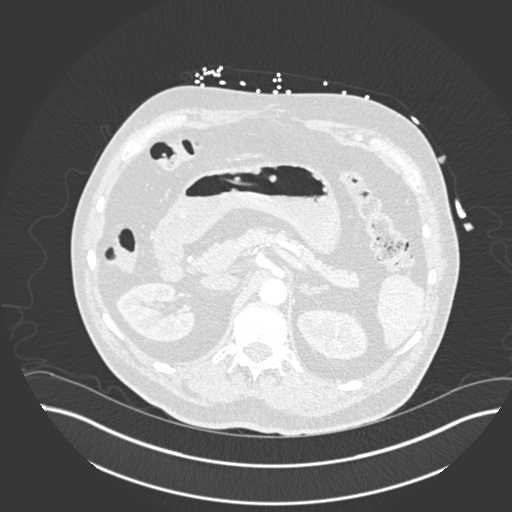
[im 38/287  soft-tissue]
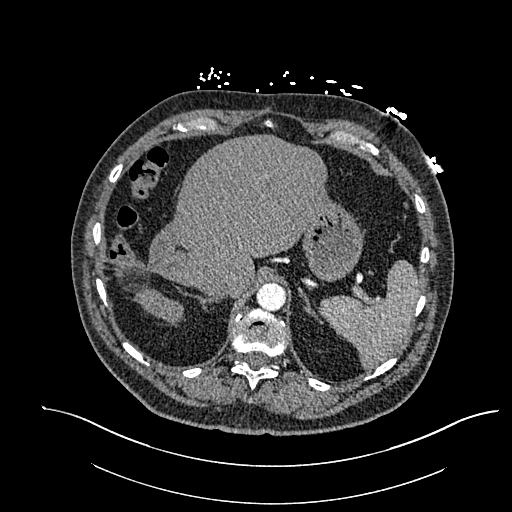
[im 50/287  lung]
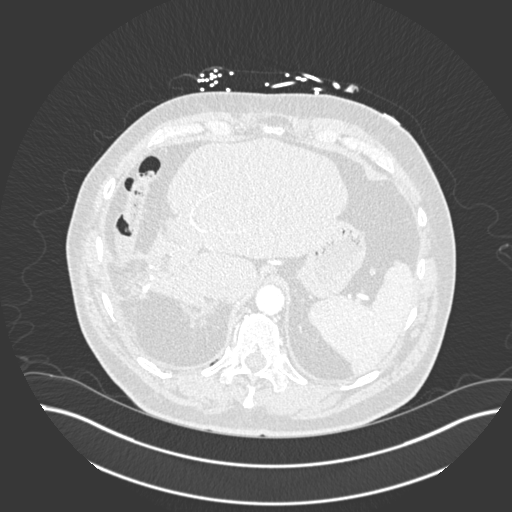
[im 75/287  soft-tissue]
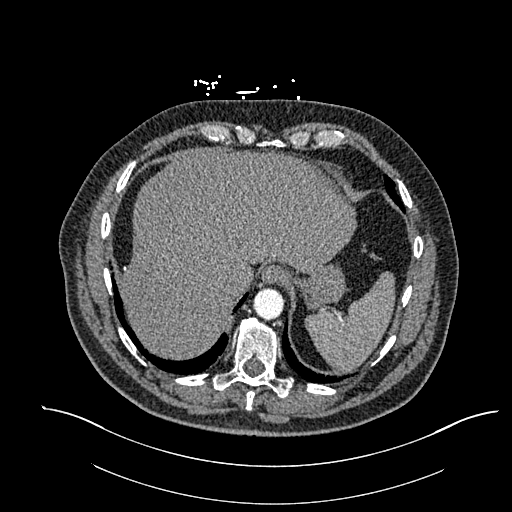
[im 100/287  lung]
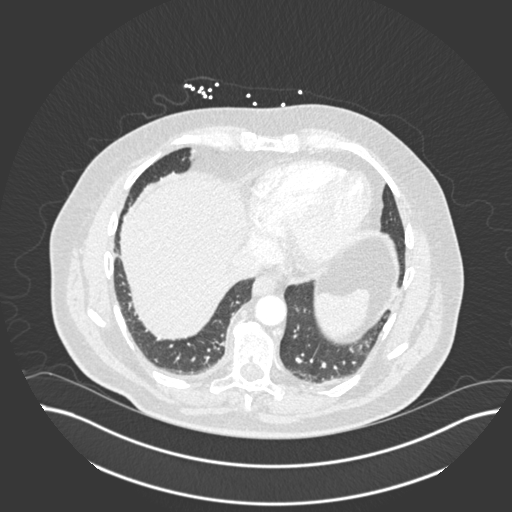
[im 112/287  soft-tissue]
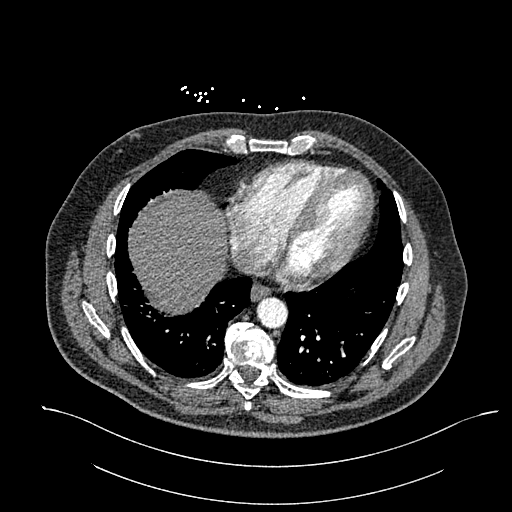
[im 137/287  lung]
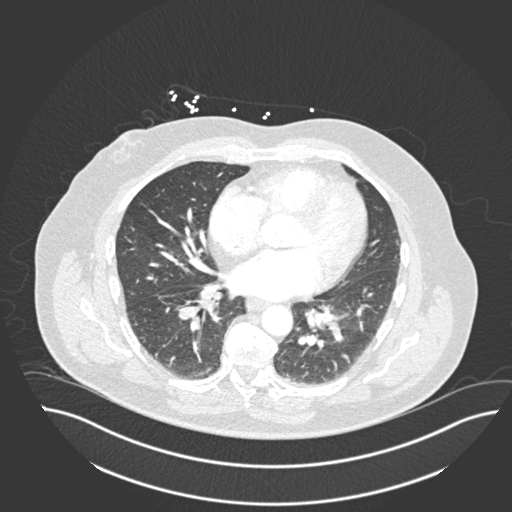
[im 150/287  soft-tissue]
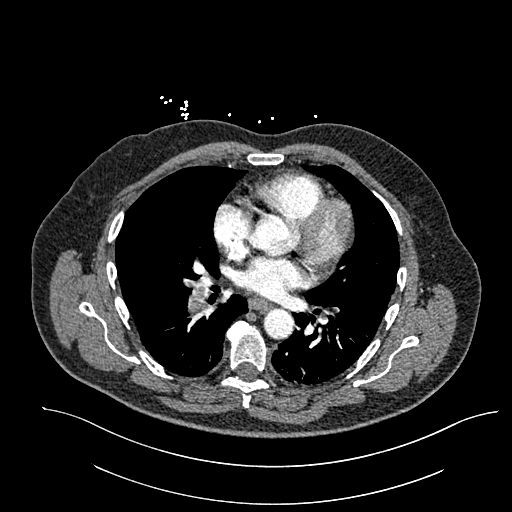
[im 175/287  lung]
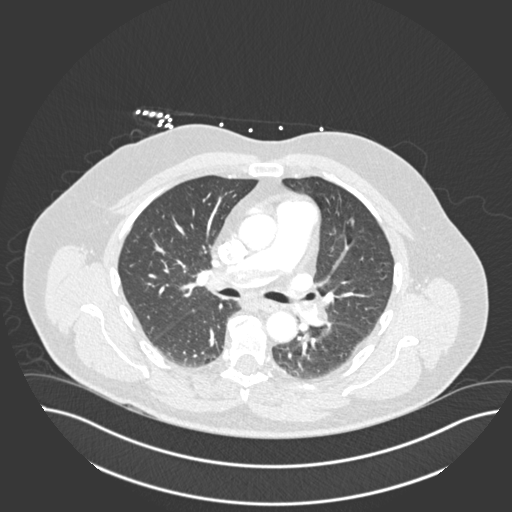
[im 187/287  soft-tissue]
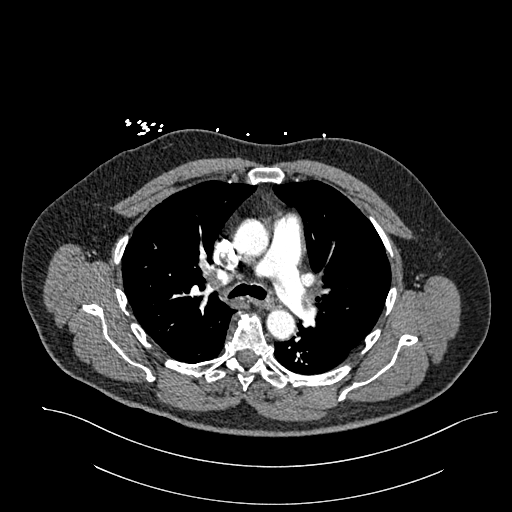
[im 212/287  lung]
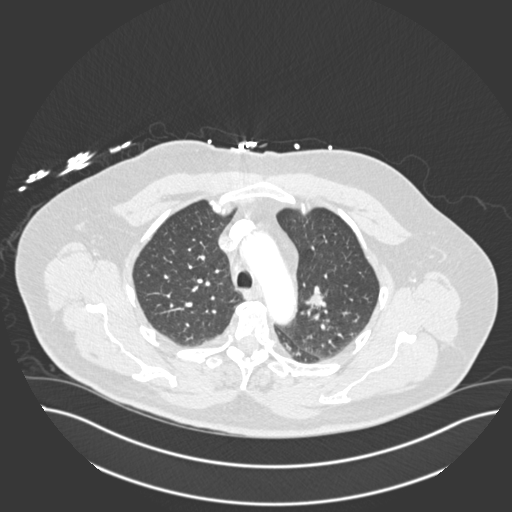
[im 237/287  soft-tissue]
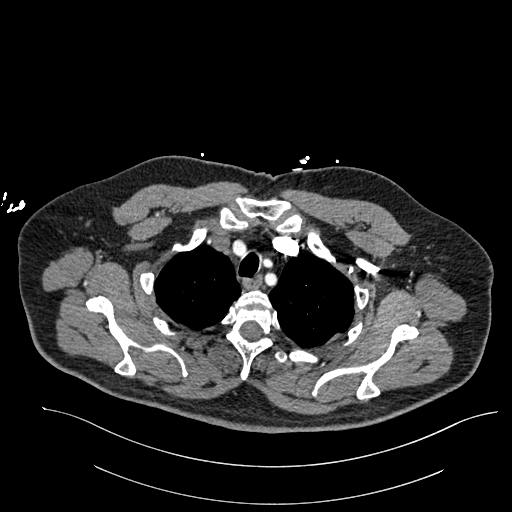
[im 249/287  lung]
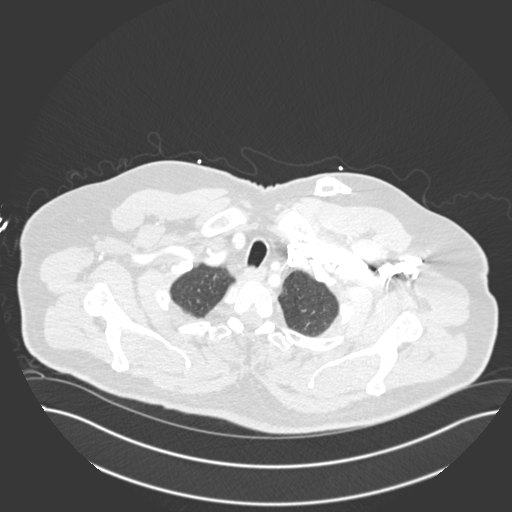
[im 274/287  soft-tissue]
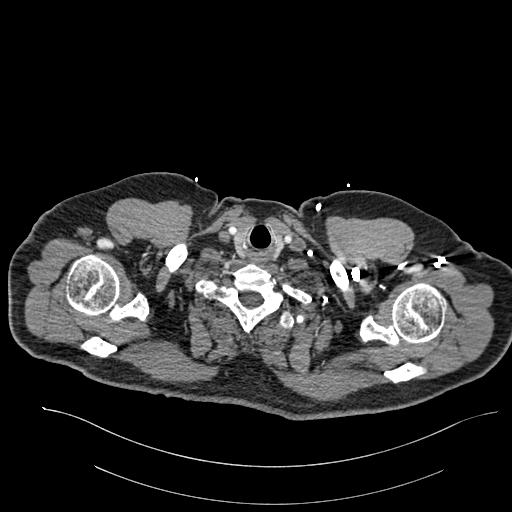

[Series 7: coronal mpr · coronal · 0.59mm/px · 3 of 170 slices shown]
[im 43/170  soft-tissue]
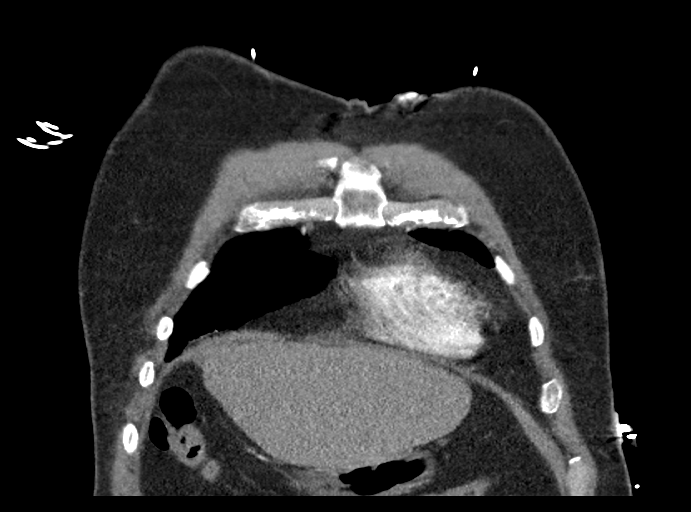
[im 85/170  soft-tissue]
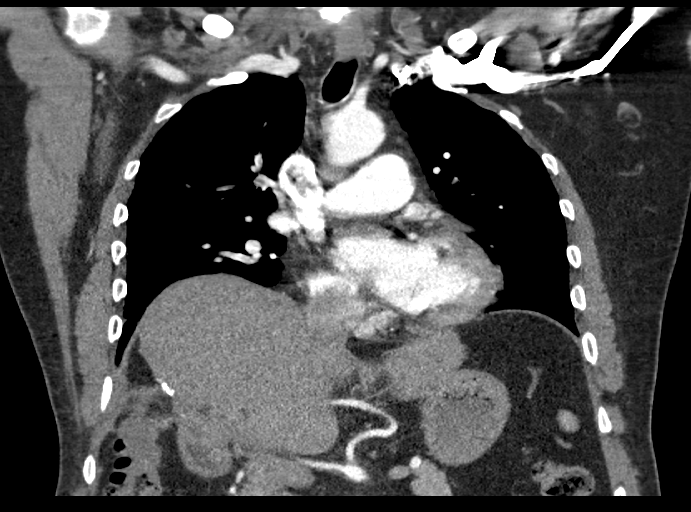
[im 127/170  soft-tissue]
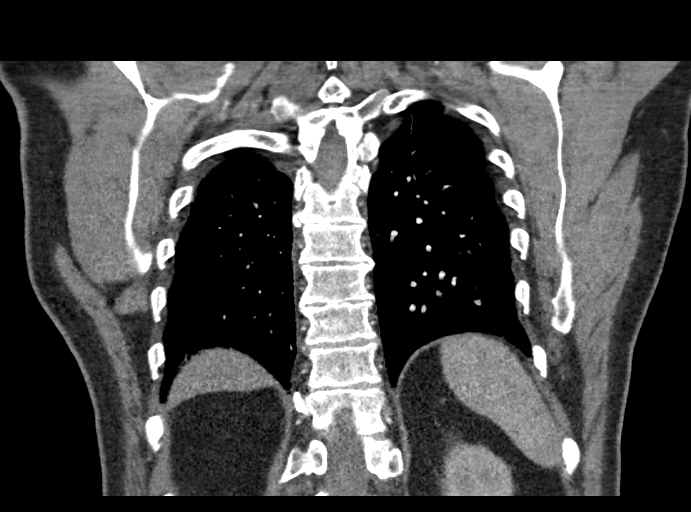

[17 of 46 positions shown; findings below may reference images not displayed]

FINDINGS: Cardiovascular: There is extensive pulmonary embolus which extends
throughout both main pulmonary arteries as well as extending into
multiple upper and lower lobe pulmonary arterial branches. The right
ventricle to left ventricle diameter ratio is 1.3, a finding
indicative of right heart strain.

There is no appreciable thoracic aortic aneurysm or dissection. The
visualized great vessels appear normal. No pericardial effusion or
pericardial thickening is evident. There are scattered foci of
coronary artery calcification.

Mediastinum/Nodes: Thyroid appears unremarkable. There are scattered
subcentimeter mediastinal lymph nodes. No adenopathy is evident in
the thoracic region. No esophageal lesions are appreciable.

Lungs/Pleura: There is atelectatic change in both lung bases. There
is no appreciable edema or consolidation. No pleural effusion or
pleural thickening is evident.

Upper Abdomen: Postoperative changes noted along the rightward
aspect of the liver. There is scarring in the right upper quadrant
of the abdomen in this area. Visualized upper abdominal structures
otherwise appear unremarkable.

Musculoskeletal: There is degenerative change in the thoracic spine.
There are no evident blastic or lytic bone lesions.

Review of the MIP images confirms the above findings.
IMPRESSION: 1. Extensive pulmonary embolus arising throughout both main
pulmonary arteries in a saddle type distribution extension into
multiple upper and lower lobe pulmonary arteries. There is evidence
of right heart strain. Positive for acute PE with CT evidence of
right heart strain (RV/LV Ratio = 1.3) consistent with at least
submassive (intermediate risk) PE. The presence of right heart
strain has been associated with an increased risk of morbidity and
mortality. Please activate Code PE by paging [phone].

2. No thoracic aortic aneurysm or dissection. There are scattered
foci of coronary artery calcification.

3. Postoperative change in the liver with adjacent scarring in the
posterior right upper quadrant region.

4. Areas of mild atelectatic change. No lung edema or consolidation.

5.  No demonstrable adenopathy.

Critical Value/emergent results were called by telephone at the time
of interpretation on 08/30/2017 at [DATE] to Dr. BAMBUCAFE TARLA ,
who verbally acknowledged these results.

## 2020-04-19 DIAGNOSIS — C44329 Squamous cell carcinoma of skin of other parts of face: Secondary | ICD-10-CM | POA: Diagnosis not present

## 2020-04-19 DIAGNOSIS — L570 Actinic keratosis: Secondary | ICD-10-CM | POA: Diagnosis not present

## 2020-04-19 DIAGNOSIS — D485 Neoplasm of uncertain behavior of skin: Secondary | ICD-10-CM | POA: Diagnosis not present

## 2020-04-21 ENCOUNTER — Ambulatory Visit (INDEPENDENT_AMBULATORY_CARE_PROVIDER_SITE_OTHER): Payer: PPO | Admitting: Family Medicine

## 2020-04-21 ENCOUNTER — Other Ambulatory Visit: Payer: Self-pay

## 2020-04-21 ENCOUNTER — Encounter: Payer: Self-pay | Admitting: Family Medicine

## 2020-04-21 VITALS — BP 135/78 | HR 71 | Ht 73.0 in | Wt 223.0 lb

## 2020-04-21 DIAGNOSIS — I10 Essential (primary) hypertension: Secondary | ICD-10-CM

## 2020-04-21 DIAGNOSIS — M1712 Unilateral primary osteoarthritis, left knee: Secondary | ICD-10-CM

## 2020-04-21 DIAGNOSIS — N400 Enlarged prostate without lower urinary tract symptoms: Secondary | ICD-10-CM

## 2020-04-21 DIAGNOSIS — E782 Mixed hyperlipidemia: Secondary | ICD-10-CM

## 2020-04-21 MED ORDER — METHYLPREDNISOLONE ACETATE 80 MG/ML IJ SUSP: 80.0000 mg | Freq: Once | INTRAMUSCULAR | Status: DC

## 2020-04-21 NOTE — Progress Notes (Addendum)
BP 135/78   Pulse 71   Ht _0  (1.854 m)   Wt 223 lb (101.2 kg)   SpO2 98%   BMI 29.42 kg/m    Subjective:   Patient ID: Grant Altes., male    DOB: 1945-12-16, 75 y.o.   MRN: [id number]  HPI: Grant Sidor. is a 75 y.o. male presenting on 04/21/2020 for Medical Management of Chronic Issues, Hypertension, Shortness of Breath (Increasing with everyday activities), and Knee Pain (Left. H/o injections)   HPI Left knee osteoarthritis Patient had previous knee injection almost a year ago with Dr. Reynaldo Minium was an orthopedic and was diagnosed with primary arthritis of that knee.  He would like an injection again today.  Is been bothering him more over the past couple months.  He denies any popping or catching or giving way.  Hypertension Patient is currently on amlodipine and hydrochlorothiazide, and their blood pressure today is 135/78. Patient denies any lightheadedness or dizziness. Patient denies headaches, blurred vision, chest pains, or weakness. Denies any side effects from medication and is content with current medication.  Patient does have some exertional shortness of breath mainly with stairs and no chest pain with it.  He continues to follow with pulmonology get scans for his lungs with history of PE.  They have not noted any problems.  Hyperlipidemia Patient is coming in for recheck of his hyperlipidemia. The patient is currently taking atorvastatin 2-3 times a week. They deny any issues with myalgias or history of liver damage from it. They deny any focal numbness or weakness or chest pain.   BPH Patient is coming in for recheck on BPH Symptoms: None currently Medication: None Last PSA: 6 months ago normal, 0.2  Relevant past medical, surgical, family and social history reviewed and updated as indicated. Interim medical history since our last visit reviewed. Allergies and medications reviewed and updated.  Review of Systems  Constitutional: Negative for chills and  fever.  Respiratory: Positive for shortness of breath. Negative for wheezing.   Cardiovascular: Negative for chest pain and leg swelling.  Musculoskeletal: Positive for arthralgias. Negative for back pain, gait problem and joint swelling.  Skin: Negative for rash.  Neurological: Negative for dizziness, weakness and numbness.  All other systems reviewed and are negative.   Per HPI unless specifically indicated above   Allergies as of 04/21/2020      Reactions   Nsaids Other (See Comments)   Stomach bleed from radiation   Other       Medication List       Accurate as of April 21, 2020  9:10 AM. If you have any questions, ask your nurse or doctor.        amLODipine 10 MG tablet Commonly known as: NORVASC TAKE ONE TABLET BY MOUTH DAILY.   apixaban 5 MG Tabs tablet Commonly known as: Eliquis Take 1 tablet (5 mg total) by mouth 2 (two) times daily.   atorvastatin 40 MG tablet Commonly known as: LIPITOR TAKE ONE (1) TABLET EACH DAY   cholecalciferol 1000 units tablet Commonly known as: VITAMIN D Take 1,000 Units by mouth daily.   hydrochlorothiazide 25 MG tablet Commonly known as: HYDRODIURIL Take 1 tablet (25 mg total) by mouth daily.   multivitamin tablet Take 1 tablet by mouth daily.   pantoprazole 40 MG tablet Commonly known as: PROTONIX TAKE ONE (1) TABLET EACH DAY.   potassium chloride 10 MEQ tablet Commonly known as: KLOR-CON Take 1 tablet (10 mEq total)  by mouth daily.        Objective:   BP 135/78   Pulse 71   Ht _0  (1.854 m)   Wt 223 lb (101.2 kg)   SpO2 98%   BMI 29.42 kg/m   Wt Readings from Last 3 Encounters:  04/21/20 223 lb (101.2 kg)  10/21/19 224 lb (101.6 kg)  08/13/18 219 lb 3.2 oz (99.4 kg)    Physical Exam Vitals and nursing note reviewed.  Constitutional:      General: He is not in acute distress.    Appearance: He is well-developed. He is not diaphoretic.  Eyes:     General: No scleral icterus.    Conjunctiva/sclera:  Conjunctivae normal.  Neck:     Thyroid: No thyromegaly.  Cardiovascular:     Rate and Rhythm: Normal rate and regular rhythm.     Heart sounds: Normal heart sounds. No murmur heard.   Pulmonary:     Effort: Pulmonary effort is normal. No respiratory distress.     Breath sounds: Normal breath sounds. No wheezing.  Musculoskeletal:        General: Normal range of motion.     Cervical back: Neck supple.     Left knee: No swelling or deformity. Normal range of motion. No tenderness (no tenderness on exam).  Lymphadenopathy:     Cervical: No cervical adenopathy.  Skin:    General: Skin is warm and dry.     Findings: No rash.  Neurological:     Mental Status: He is alert and oriented to person, place, and time.     Coordination: Coordination normal.  Psychiatric:        Behavior: Behavior normal.     Knee injection: Consent form signed. Risk factors of bleeding and infection discussed with patient and patient is agreeable towards injection. Patient prepped with Betadine. Lateral approach towards injection used. Injected 80 mg of Depo-Medrol and 1 mL of 2% lidocaine. Patient tolerated procedure well and no side effects from noted. Minimal to no bleeding. Simple bandage applied after.   Assessment & Plan:   Problem List Items Addressed This Visit      Cardiovascular and Mediastinum   Hypertension - Primary   Relevant Orders   CBC with Differential/Platelet (Completed)   CMP14+EGFR (Completed)     Genitourinary   BPH (benign prostatic hyperplasia)     Other   Hyperlipidemia   Relevant Orders   CBC with Differential/Platelet (Completed)   CMP14+EGFR (Completed)   Lipid panel (Completed)    Other Visit Diagnoses    Primary osteoarthritis of left knee       Relevant Medications   methylPREDNISolone acetate (DEPO-MEDROL) injection 80 mg      Continue current medication.  No changes, will do a knee injection for his knee.  Patient having some shortness of breath every day  activities but no chest pain or tightness or tenderness.  It has increased over the past couple years when the pandemic is been going on and has been stuck at home more often.  We will monitor exertional dyspnea for now.  If worsens may send back to cardiology Follow up plan: Return in about 6 months (around 10/22/2020), or if symptoms worsen or fail to improve, for Physical and recheck of chronic health issues.  Counseling provided for all of the vaccine components Orders Placed This Encounter  Procedures  . CBC with Differential/Platelet  . CMP14+EGFR  . Lipid panel    Caryl Pina, MD [facility] 04/21/2020, 9:10 AM

## 2020-04-22 LAB — LIPID PANEL
Chol/HDL Ratio: 4.2 ratio (ref 0.0–5.0)
Cholesterol, Total: 176 mg/dL (ref 100–199)
HDL: 42 mg/dL (ref >39)
LDL Chol Calc (NIH): 106 mg/dL — ABNORMAL HIGH (ref 0–99)
Triglycerides: 159 mg/dL — ABNORMAL HIGH (ref 0–149)
VLDL Cholesterol Cal: 28 mg/dL (ref 5–40)

## 2020-04-22 LAB — CBC WITH DIFFERENTIAL/PLATELET
Basophils Absolute: 0 10*3/uL (ref 0.0–0.2)
Basos: 0 %
EOS (ABSOLUTE): 0 10*3/uL (ref 0.0–0.4)
Eos: 1 %
Hematocrit: 43.8 % (ref 37.5–51.0)
Hemoglobin: 14.8 g/dL (ref 13.0–17.7)
Immature Grans (Abs): 0 10*3/uL (ref 0.0–0.1)
Immature Granulocytes: 0 %
Lymphocytes Absolute: 0.7 10*3/uL (ref 0.7–3.1)
Lymphs: 14 %
MCH: 31.8 pg (ref 26.6–33.0)
MCHC: 33.8 g/dL (ref 31.5–35.7)
MCV: 94 fL (ref 79–97)
Monocytes Absolute: 0.4 10*3/uL (ref 0.1–0.9)
Monocytes: 8 %
Neutrophils Absolute: 3.7 10*3/uL (ref 1.4–7.0)
Neutrophils: 77 %
Platelets: 199 10*3/uL (ref 150–450)
RBC: 4.65 x10E6/uL (ref 4.14–5.80)
RDW: 13.1 % (ref 11.6–15.4)
WBC: 4.8 10*3/uL (ref 3.4–10.8)

## 2020-04-22 LAB — CMP14+EGFR
ALT: 37 IU/L (ref 0–44)
AST: 30 IU/L (ref 0–40)
Albumin/Globulin Ratio: 1.9 (ref 1.2–2.2)
Albumin: 4.6 g/dL (ref 3.7–4.7)
Alkaline Phosphatase: 70 IU/L (ref 44–121)
BUN/Creatinine Ratio: 17 (ref 10–24)
BUN: 19 mg/dL (ref 8–27)
Bilirubin Total: 0.5 mg/dL (ref 0.0–1.2)
CO2: 25 mmol/L (ref 20–29)
Calcium: 9.7 mg/dL (ref 8.6–10.2)
Chloride: 99 mmol/L (ref 96–106)
Creatinine, Ser: 1.13 mg/dL (ref 0.76–1.27)
Globulin, Total: 2.4 g/dL (ref 1.5–4.5)
Glucose: 113 mg/dL — ABNORMAL HIGH (ref 65–99)
Potassium: 3.9 mmol/L (ref 3.5–5.2)
Sodium: 140 mmol/L (ref 134–144)
Total Protein: 7 g/dL (ref 6.0–8.5)
eGFR: 68 mL/min/{1.73_m2} (ref >59)

## 2020-04-25 DIAGNOSIS — D3131 Benign neoplasm of right choroid: Secondary | ICD-10-CM | POA: Diagnosis not present

## 2020-04-25 DIAGNOSIS — H524 Presbyopia: Secondary | ICD-10-CM | POA: Diagnosis not present

## 2020-05-11 DIAGNOSIS — L570 Actinic keratosis: Secondary | ICD-10-CM | POA: Diagnosis not present

## 2020-05-11 DIAGNOSIS — C44329 Squamous cell carcinoma of skin of other parts of face: Secondary | ICD-10-CM | POA: Diagnosis not present

## 2020-06-19 DIAGNOSIS — K388 Other specified diseases of appendix: Secondary | ICD-10-CM | POA: Diagnosis not present

## 2020-06-19 DIAGNOSIS — C221 Intrahepatic bile duct carcinoma: Secondary | ICD-10-CM | POA: Diagnosis not present

## 2020-06-19 DIAGNOSIS — Z08 Encounter for follow-up examination after completed treatment for malignant neoplasm: Secondary | ICD-10-CM | POA: Diagnosis not present

## 2020-06-19 DIAGNOSIS — Z8505 Personal history of malignant neoplasm of liver: Secondary | ICD-10-CM | POA: Diagnosis not present

## 2020-06-19 DIAGNOSIS — R918 Other nonspecific abnormal finding of lung field: Secondary | ICD-10-CM | POA: Diagnosis not present

## 2020-06-19 DIAGNOSIS — Z9049 Acquired absence of other specified parts of digestive tract: Secondary | ICD-10-CM | POA: Diagnosis not present

## 2020-06-19 DIAGNOSIS — Z86711 Personal history of pulmonary embolism: Secondary | ICD-10-CM | POA: Diagnosis not present

## 2020-06-19 DIAGNOSIS — Z7901 Long term (current) use of anticoagulants: Secondary | ICD-10-CM | POA: Diagnosis not present

## 2020-06-19 DIAGNOSIS — Z85820 Personal history of malignant melanoma of skin: Secondary | ICD-10-CM | POA: Diagnosis not present

## 2020-07-26 ENCOUNTER — Other Ambulatory Visit: Payer: Self-pay | Admitting: Family Medicine

## 2020-07-26 DIAGNOSIS — I10 Essential (primary) hypertension: Secondary | ICD-10-CM

## 2020-07-26 DIAGNOSIS — K219 Gastro-esophageal reflux disease without esophagitis: Secondary | ICD-10-CM

## 2020-08-15 ENCOUNTER — Ambulatory Visit (INDEPENDENT_AMBULATORY_CARE_PROVIDER_SITE_OTHER): Payer: PPO | Admitting: Physician Assistant

## 2020-08-15 ENCOUNTER — Encounter: Payer: Self-pay | Admitting: Physician Assistant

## 2020-08-15 DIAGNOSIS — U071 COVID-19: Secondary | ICD-10-CM

## 2020-08-15 MED ORDER — NIRMATRELVIR/RITONAVIR (PAXLOVID)TABLET: 3.0000 | ORAL_TABLET | Freq: Two times a day (BID) | ORAL | 0 refills | Status: DC

## 2020-08-15 NOTE — Progress Notes (Signed)
   Virtual Visit  Note Due to COVID-19 pandemic this visit was conducted virtually. This visit type was conducted due to national recommendations for restrictions regarding the COVID-19 Pandemic (e.g. social distancing, sheltering in place) in an effort to limit this patient's exposure and mitigate transmission in our community. All issues noted in this document were discussed and addressed.  A physical exam was not performed with this format.  I connected with Grant Altes. on 08/15/20 at  by telephone and verified that I am speaking with the correct person using two identifiers. Grant Altes. is currently located at home and  is currently with his wife during visit. The provider, Thomasene Mohair, PA-C is located in their office at time of visit.  I discussed the limitations, risks, security and privacy concerns of performing an evaluation and management service by telephone and the availability of in person appointments. I also discussed with the patient that there may be a patient responsible charge related to this service. The patient expressed understanding and agreed to proceed.   History and Present Illness:  Pt with onset of fever,chills, cough , and congestion last pm He took at home test today and was positive + vacc/booster hx - Moderna No prev COVID infection Pt with [facility]x     Review of Systems  Constitutional:  Positive for chills, fever and malaise/fatigue.  HENT:  Positive for congestion. Negative for ear pain.   Respiratory:  Positive for cough. Negative for shortness of breath and wheezing.     Observations/Objective: Defer  Assessment and Plan: COVID  Fluids Rest OTC meds for sx Due to [facility] hx and nl recent renal profile rx for Paxlovid sent to pharmacy Discussed possible SE Pt will also call his Oncologist and clear with them F/U prn  Follow Up Instructions: prn    I discussed the assessment and treatment plan with the patient.  The patient was provided an opportunity to ask questions and all were answered. The patient agreed with the plan and demonstrated an understanding of the instructions.   The patient was advised to call back or seek an in-person evaluation if the symptoms worsen or if the condition fails to improve as anticipated.  The above assessment and management plan was discussed with the patient. The patient verbalized understanding of and has agreed to the management plan. Patient is aware to call the clinic if symptoms persist or worsen. Patient is aware when to return to the clinic for a follow-up visit. Patient educated on when it is appropriate to go to the emergency department.   Time call ended:    I provided 11 minutes of  non face-to-face time during this encounter.    Thomasene Mohair, PA-C

## 2020-08-15 NOTE — Patient Instructions (Signed)
COVID-19: Quarantine and Isolation Quarantine If you were exposed Quarantine and stay away from others when you have been in close contact with someone whohas COVID-19. Isolate If you are sick or test positive Isolate when you are sick or when you have COVID-19, even if you don't have symptoms. When to stay home Calculating quarantine The date of your exposure is considered day 0. Day 1 is the first full day after your last contact with a person who has had COVID-19. Stay home and away from other people for at least 5 days. Learn why CDC updated guidance for the general public. IF YOU were exposed to COVID-19 and are NOT up-to-date IF YOU were exposed to COVID-19 and are NOT on COVID-19 vaccinations Quarantine for at least 5 days Stay home Stay home and quarantine for at least 5 full days. Wear a well-fitted mask if you must be around others in your home. Do not travel. Get tested Even if you don't develop symptoms, get tested at least 5 days after you last had close contact with someone with COVID-19. After quarantine Watch for symptoms Watch for symptoms until 10 days after you last had close contact with someone with COVID-19. Avoid travel It is best to avoid travel until a full 10 days after you last had close contact with someone with COVID-19. If you develop symptoms Isolate immediately and get tested. Continue to stay home until you know the results. Wear a well-fitted mask around others. Take precautions until day 10 Wear a mask Wear a well-fitted mask for 10 full days any time you are around others inside your home or in public. Do not go to places where you are unable to wear a mask. If you must travel during days 6-10, take precautions. Avoid being around people who are at high risk IF YOU were exposed to COVID-19 and are up-to-date IF YOU were exposed to COVID-19 and are on COVID-19 vaccinations No quarantine You do not need to stay home unless you develop  symptoms. Get tested Even if you don't develop symptoms, get tested at least 5 days after you last had close contact with someone with COVID-19. Watch for symptoms Watch for symptoms until 10 days after you last had close contact with someone with COVID-19. If you develop symptoms Isolate immediately and get tested. Continue to stay home until you know the results. Wear a well-fitted mask around others. Take precautions until day 10 Wear a mask Wear a well-fitted mask for 10 full days any time you are around others inside your home or in public. Do not go to places where you are unable to wear a mask. Take precautions if traveling Avoid being around people who are at high risk IF YOU were exposed to COVID-19 and had confirmed COVID-19 within the past 90 days (you tested positive using a viral test) No quarantine You do not need to stay home unless you develop symptoms. Watch for symptoms Watch for symptoms until 10 days after you last had close contact with someone with COVID-19. If you develop symptoms Isolate immediately and get tested. Continue to stay home until you know the results. Wear a well-fitted mask around others. Take precautions until day 10 Wear a mask Wear a well-fitted mask for 10 full days any time you are around others inside your home or in public. Do not go to places where you are unable to wear a mask. Take precautions if traveling Avoid being around people who are at high risk Calculating isolation  Day 0 is your first day of symptoms or a positive viral test. Day 1 is the first full day after your symptoms developed or your test specimen was collected. If you have COVID-19 or have symptoms, isolate for at least 5 days. IF YOU tested positive for COVID-19 or have symptoms, regardless of vaccination status Stay home for at least 5 days Stay home for 5 days and isolate from others in your home. Wear a well-fitted mask if you must be around others in your home. Do not  travel. Ending isolation if you had symptoms End isolation after 5 full days if you are fever-free for 24 hours (without the use of fever-reducing medication) and your symptoms are improving. Ending isolation if you did NOT have symptoms End isolation after at least 5 full days after your positive test. If you were severely ill with COVID-19 or are immunocompromised You should isolate for at least 10 days. Consult your doctor before ending isolation. Take precautions until day 10 Wear a mask Wear a well-fitted mask for 10 full days any time you are around others inside your home or in public. Do not go to places where you are unable to wear a mask. Do not travel Do not travel until a full 10 days after your symptoms started or the date your positive test was taken if you had no symptoms. Avoid being around people who are at high risk Definitions Exposure Contact with someone infected with SARS-CoV-2, the virus that causes COVID-19,in a way that increases the likelihood of getting infected with the virus. Close contact A close contact is someone who was less than 6 feet away from an infected person (laboratory-confirmed or a clinical diagnosis) for a cumulative total of 15 minutes or more over a 24-hour period. For example, three individual 5-minute exposures for a total of 15 minutes. People who are exposed to someone with COVID-19 after they completed at least 5 days of isolation are notconsidered close contacts. Julio Sicks is a strategy used to prevent transmission of COVID-19 by keeping people who have been in close contact with someone with COVID-19 apart from others. Who does not need to quarantine? If you had close contact with someone with COVID-19 and you are in one of the following groups, you do not need to quarantine. You are up to date with your COVID-19 vaccines. You had confirmed COVID-19 within the last 90 days (meaning you tested positive using a viral test). You  should wear a well-fitting mask around others for 10 days from the date of your last close contact with someone with COVID-19 (the date of last close contact is considered day 0). Get tested at least 5 days after you last had close contact with someone with COVID-19. If you test positive or develop COVID-19 symptoms, isolate from other people and follow recommendations in the Isolation section below. If you tested positive for COVID-19 with a viral test within the previous 90 days and subsequently recovered and remain without COVID-19 symptoms, you do not need to quarantine or get tested after close contact. You should wear a well-fitting mask around others for 10 days from the date of your last close contact withsomeone with COVID-19 (the date of last close contact is considered day 0). Who should quarantine? If you come into close contact with someone with COVID-19, you should quarantine if you are not up to date on COVID-19 vaccines. This includes people who are not vaccinated. What to do for quarantine Stay home and away  from other people for at least 5 days (day 0 through day 5) after your last contact with a person who has COVID-19. The date of your exposure is considered day 0. Wear a well-fitting mask when around others at home, if possible. For 10 days after your last close contact with someone with COVID-19, watch for fever (100.66F or greater), cough, shortness of breath, or other COVID-19 symptoms. If you develop symptoms, get tested immediately and isolate until you receive your test results. If you test positive, follow isolation recommendations. If you do not develop symptoms, get tested at least 5 days after you last had close contact with someone with COVID-19. If you test negative, you can leave your home, but continue to wear a well-fitting mask when around others at home and in public until 10 days after your last close contact with someone with COVID-19. If you test positive, you should  isolate for at least 5 days from the date of your positive test (if you do not have symptoms). If you do develop COVID-19 symptoms, isolate for at least 5 days from the date your symptoms began (the date the symptoms started is day 0). Follow recommendations in the isolation section below. If you are unable to get a test 5 days after last close contact with someone with COVID-19, you can leave your home after day 5 if you have been without COVID-19 symptoms throughout the 5-day period. Wear a well-fitting mask for 10 days after your date of last close contact when around others at home and in public. Avoid people who are immunocompromised or at high risk for severe disease, and nursing homes and other high-risk settings, until after at least 10 days. If possible, stay away from people you live with, especially people who are at higher risk for getting very sick from COVID-19, as well as others outside your home throughout the full 10 days after your last close contact with someone with COVID-19. If you are unable to quarantine, you should wear a well-fitting mask for 10 days when around others at home and in public. If you are unable to wear a mask when around others, you should continue to quarantine for 10 days. Avoid people who are immunocompromised or at high risk for severe disease, and nursing homes and other high-risk settings, until after at least 10 days. See additional information about travel. Do not go to places where you are unable to wear a mask, such as restaurants and some gyms, and avoid eating around others at home and at work until after 10 days after your last close contact with someone with COVID-19. After quarantine Watch for symptoms until 10 days after your last close contact with someone with COVID-19. If you have symptoms, isolate immediately and get tested. Quarantine in high-risk congregate settings In certain congregate settings that have high risk of secondary transmission  (such as Systems analyst and detention facilities, homeless shelters, or cruise ships), CDC recommends a 10-day quarantine for residents, regardless of vaccination and booster status. During periods of critical staffing shortages, facilities may consider shortening the quarantine period for staff to ensure continuity of operations. Decisions to shorten quarantine in these settings should be made in consultation with state, local, tribal, or territorial health departments and should take into consideration the context and characteristics of the facility. CDC's setting-specific guidance provides additional recommendations for these settings. Isolation Isolation is used to separate people with confirmed or suspected COVID-19 from those without COVID-19. People who are in isolation should stay  home until it's safe for them to be around others. At home, anyone sick or infected should separate from others, or wear a well-fitting mask when they need to be around others. People in isolation should stay in a specific "sick room" or area and use a separate bathroom if available. Everyone who has presumed or confirmed COVID-19 should stay home and isolate from other people for at least 5 full days (day 0 is the first day of symptoms or the date of the day of the positive viral test for asymptomatic persons). They should wear a mask when around others at home and in public for an additional 5 days. People who are confirmed to have COVID-19 or are showing symptoms of COVID-19 need to isolate regardless of their vaccination status. This includes: People who have a positive viral test for COVID-19, regardless of whether or not they have symptoms. People with symptoms of COVID-19, including people who are awaiting test results or have not been tested. People with symptoms should isolate even if they do not know if they have been in close contact with someone with COVID-19. What to do for isolation Monitor your symptoms. If you  have an emergency warning sign (including trouble breathing), seek emergency medical care immediately. Stay in a separate room from other household members, if possible. Use a separate bathroom, if possible. Take steps to improve ventilation at home, if possible. Avoid contact with other members of the household and pets. Don't share personal household items, like cups, towels, and utensils. Wear a well-fitting mask when you need to be around other people. Learn more about what to do if you are sick and how to notify your contacts. Ending isolation for people who had COVID-19 and had symptoms If you had COVID-19 and had symptoms, isolate for at least 5 days. To calculate your 5-day isolation period, day 0 is your first day of symptoms. Day 1 is the first full day after your symptoms developed. You can leave isolation after 5 full days. You can end isolation after 5 full days if you are fever-free for 24 hours without the use of fever-reducing medication and your other symptoms have improved (Loss of taste and smell may persist for weeks or months after recovery and need not delay the end of isolation). You should continue to wear a well-fitting mask around others at home and in public for 5 additional days (day 6 through day 10) after the end of your 5-day isolation period. If you are unable to wear a mask when around others, you should continue to isolate for a full 10 days. Avoid people who are immunocompromised or at high risk for severe disease, and nursing homes and other high-risk settings, until after at least 10 days. If you continue to have fever or your other symptoms have not improved after 5 days of isolation, you should wait to end your isolation until you are fever-free for 24 hours without the use of fever-reducing medication and your other symptoms have improved. Continue to wear a well-fitting mask. Contact your healthcare provider if you have questions. See additional information about  travel. Do not go to places where you are unable to wear a mask, such as restaurants and some gyms, and avoid eating around others at home and at work until a full 10 days after your first day of symptoms. If an individual has access to a test and wants to test, the best approach is to use an antigen test1 towards the end of  the 5-day isolation period. Collect the test sample only if you are fever-free for 24 hours without the use of fever-reducing medication and your other symptoms have improved (loss of taste and smell may persist for weeks or months after recovery and need not delay the end of isolation). If your test result is positive, you should continue to isolate until day 10. If your test result is negative, you can end isolation, but continue to wear a well-fitting mask around others at home and in public until day 10. Follow additional recommendations for masking and avoiding travel as described above. 1As noted in the labeling for authorized over-the counter antigen tests: Negative results should be treated as presumptive. Negative results do not rule out SARS-CoV-2 infection and should not be used as the sole basis for treatment or patient management decisions, including infection control decisions. To improve results, antigen tests should be used twice over a three-day period with at least 24 hours and no more than 48 hours between tests. Note that these recommendations on ending isolation do not apply to people with moderate or severe COVID-19 or with weakened immune systems (immunocompromised). See section below for recommendations for when toend isolation for these groups. Ending isolation for people who tested positive for COVID-19 but had no symptoms If you test positive for COVID-19 and never develop symptoms, isolate for at least 5 days. Day 0 is the day of your positive viral test (based on the date you were tested) and day 1 is the first full day after the specimen was collected for your  positive test. You can leave isolation after 5 full days. If you continue to have no symptoms, you can end isolation after at least 5 days. You should continue to wear a well-fitting mask around others at home and in public until day 10 (day 6 through day 10). If you are unable to wear a mask when around others, you should continue to isolate for 10 days. Avoid people who are immunocompromised or at high risk for severe disease, and nursing homes and other high-risk settings, until after at least 10 days. If you develop symptoms after testing positive, your 5-day isolation period should start over. Day 0 is your first day of symptoms. Follow the recommendations above for ending isolation for people who had COVID-19 and had symptoms. See additional information about travel. Do not go to places where you are unable to wear a mask, such as restaurants and some gyms, and avoid eating around others at home and at work until 10 days after the day of your positive test. If an individual has access to a test and wants to test, the best approach is to use an antigen test1 towards the end of the 5-day isolation period. If your test result is positive, you should continue to isolate until day 10. If your test result is negative, you can end isolation, but continue to wear a well-fitting mask around others at home and in public until day 10. Follow additionalrecommendations for masking and avoiding travel as described above. 1As noted in the labeling for authorized over-the counter antigen tests: Negative results should be treated as presumptive. Negative results do not rule out SARS-CoV-2 infection and should not be used as the sole basis for treatment or patient management decisions, including infection control decisions. To improve results, antigen tests should be used twice over a three-day period with at least 24 hours and no more than 48 hours between tests. Ending isolation for people who were  severely ill with  COVID-19 or have a weakened immune system (immunocompromised) People who are severely ill with COVID-19 (including those who were hospitalized or required intensive care or ventilation support) and people with compromised immune systems might need to isolate at home longer. They may also require testing with a viral test to determine when they can be around others. CDC recommends an isolation period of at least 10 and up to 20 days for people who were severely ill with COVID-19 and for people with weakened immune systems. Consult with your healthcare provider about when you can resume being aroundother people. People who are immunocompromised should talk to their healthcare provider about the potential for reduced immune responses to COVID-19 vaccines and the need to continue to follow current prevention measures (including wearing a well-fitting mask, staying 6 feet apart from others they don't live with, and avoiding crowds and poorly ventilated indoor spaces) to protect themselves against COVID-19 until advised otherwise by their healthcare provider. Close contacts of immunocompromised people--including household members--should also be encouraged to receive all recommended COVID-19 vaccine doses to help protect these people. Isolation in high-risk congregate settings In certain high-risk congregate settings that have high risk of secondary transmission and where it is not feasible to cohort people (such as Systems analyst and detention facilities, homeless shelters, and cruise ships), CDC recommends a 10-day isolation period for residents. During periods of critical staffing shortages, facilities may consider shortening the isolation period for staff to ensure continuity of operations. Decisions to shorten isolation in these settings should be made in consultation with state, local, tribal, or territorial health departments and should take into consideration the context and characteristics of the facility.  CDC's setting-specific guidance provides additional recommendations for these settings. This CDC guidance is meant to supplement--not replace--any federal, state, local,territorial, or tribal health and safety laws, rules, and regulations. Recommendations for specific settings These recommendations do not apply to healthcare professionals. For guidance specific to these settings, see Healthcare professionals: Interim Guidance for Optician, dispensing with SARS-CoV-2 Infection or Exposure to SARS-CoV-2 Patients, residents, and visitors to healthcare settings: Interim Infection Prevention and Control Recommendations for Healthcare Personnel During the [place] 2019 (COVID-19) Pandemic Additional setting-specific guidance and recommendations are available. These recommendations on quarantine and isolation do apply to [facility] settings. Additional guidance is available here: Overview of COVID-19 Quarantine for K-12 Schools Travelers: Travel information and recommendations Congregate facilities and other settings: [employer] for community, work, and school settings Ongoing COVID-19 exposure FAQs I live with someone with COVID-19, but I cannot be separated from them. How do we manage quarantine in this situation? It is very important for people with COVID-19 to remain apart from other people, if possible, even if they are living together. If separation of the person with COVID-19 from others that they live with is not possible, the other people that they live with will have ongoing exposure, meaning they will be repeatedly exposed until that person is no longer able to spread the virus to other people. In this situation, there are precautions you can take to limit the spread of COVID-19: The person with COVID-19 and everyone they live with should wear a well-fitting mask inside the home. If possible, one person should care for the person with COVID-19 to limit the number of people  who are in close contact with the infected person. Take steps to protect yourself and others to reduce transmission in the home: Quarantine if you are not up to date with your COVID-19  vaccines. Isolate if you are sick or tested positive for COVID-19, even if you don't have symptoms. Learn more about the public health recommendations for testing, mask use and quarantine of close contacts, like yourself, who have ongoing exposure. These recommendations differ depending on your vaccination status. What should I do if I have ongoing exposure to COVID-19 from someone I live with? Recommendations for this situation depend on your vaccination status: If you are not up to date on COVID-19 vaccines and have ongoing exposure to COVID-19, you should: Begin quarantine immediately and continue to quarantine throughout the isolation period of the person with COVID-19. Continue to quarantine for an additional 5 days starting the day after the end of isolation for the person with COVID-19. Get tested at least 5 days after the end of isolation of the infected person that lives with them. If you test negative, you can leave the home but should continue to wear a well-fitting mask when around others at home and in public until 10 days after the end of isolation for the person with COVID-19. Isolate immediately if you develop symptoms of COVID-19 or test positive. If you are up to date with COVID-19 vaccines and have ongoing exposure to COVID-19, you should: Get tested at least 5 days after your first exposure. A person with COVID-19 is considered infectious starting 2 days before they develop symptoms, or 2 days before the date of their positive test if they do not have symptoms. Get tested again at least 5 days after the end of isolation for the person with COVID-19. Wear a well-fitting mask when you are around the person with COVID-19, and do this throughout their isolation period. Wear a well-fitting mask around  others for 10 days after the infected person's isolation period ends. Isolate immediately if you develop symptoms of COVID-19 or test positive. What should I do if multiple people I live with test positive for COVID-19 at different times? Recommendations for this situation depend on your vaccination status: If you are not up to date with your COVID-19 vaccines, you should: Quarantine throughout the isolation period of any infected person that you live with. Continue to quarantine until 5 days after the end of isolation date for the most recently infected person that lives with you. For example, if the last day of isolation of the person most recently infected with COVID-19 was June 30, the new 5-day quarantine period starts on July 1. Get tested at least 5 days after the end of isolation for the most recently infected person that lives with you. Wear a well-fitting mask when you are around any person with COVID-19 while that person is in isolation. Wear a well-fitting mask when you are around other people until 10 days after your last close contact. Isolate immediately if you develop symptoms of COVID-19 or test positive. If you are up to date with COVID-19 your vaccines , you should: Get tested at least 5 days after your first exposure. A person with COVID-19 is considered infectious starting 2 days before they developed symptoms, or 2 days before the date of their positive test if they do not have symptoms. Get tested again at least 5 days after the end of isolation for the most recently infected person that lives with you. Wear a well-fitting mask when you are around any person with COVID-19 while that person is in isolation. Wear a well-fitting mask around others for 10 days after the end of isolation for the most recently infected person that  lives with you. For example, if the last day of isolation for the person most recently infected with COVID-19 was June 30, the new 10-day period to wear a  well-fitting mask indoors in public starts on July 1. Isolate immediately if you develop symptoms of COVID-19 or test positive. I had COVID-19 and completed isolation. Do I have to quarantine or get tested if someone I live with gets COVID-19 shortly after I completed isolation? No. If you recently completed isolation and someone that lives with you tests positive for the virus that causes COVID-19 shortly after the end of your isolation period, you do not have to quarantine or get tested as long as you do not develop new symptoms. Once all of the people that live together have completed isolation or quarantine, refer to the guidance below for new exposures to COVID-19. If you had COVID-19 in the previous 90 days and then came into close contact with someone with COVID-19, you do not have to quarantine or get tested if you do not have symptoms. But you should: Wear a well-fitting mask indoors in public for 10 days after exposure. Monitor for COVID-19 symptoms and isolate immediately if symptoms develop. Consult with a healthcare provider for testing recommendations if new symptoms develop. If more than 90 days have passed since your recovery from infection, follow CDC's recommendations for close contacts. These recommendations will differ depending on your vaccination status. 03/09/2020 Content source: [facility] ([facility]), Division of Viral Diseases This information is not intended to replace advice given to you by your health care provider. Make sure you discuss any questions you have with your healthcare provider. Document Revised: 03/17/2020 Document Reviewed: 03/17/2020 Elsevier Patient Education  [place].

## 2020-08-16 ENCOUNTER — Telehealth: Payer: Self-pay | Admitting: Family Medicine

## 2020-08-17 ENCOUNTER — Other Ambulatory Visit: Payer: Self-pay | Admitting: Family Medicine

## 2020-08-17 MED ORDER — MOLNUPIRAVIR EUA 200MG CAPSULE: 4.0000 | ORAL_CAPSULE | Freq: Two times a day (BID) | ORAL | 0 refills | Status: AC

## 2020-08-17 NOTE — Progress Notes (Signed)
Patient aware and verbalized understanding. °

## 2020-08-17 NOTE — Progress Notes (Unsigned)
Please let her know that I sent in the other medicine for COVID and it should not interact.

## 2020-09-12 DIAGNOSIS — L570 Actinic keratosis: Secondary | ICD-10-CM | POA: Diagnosis not present

## 2020-09-22 DIAGNOSIS — M1711 Unilateral primary osteoarthritis, right knee: Secondary | ICD-10-CM | POA: Diagnosis not present

## 2020-09-22 DIAGNOSIS — M1712 Unilateral primary osteoarthritis, left knee: Secondary | ICD-10-CM | POA: Diagnosis not present

## 2020-09-29 ENCOUNTER — Other Ambulatory Visit: Payer: Self-pay | Admitting: Family Medicine

## 2020-10-24 ENCOUNTER — Ambulatory Visit (INDEPENDENT_AMBULATORY_CARE_PROVIDER_SITE_OTHER): Payer: PPO | Admitting: Family Medicine

## 2020-10-24 ENCOUNTER — Encounter: Payer: Self-pay | Admitting: Family Medicine

## 2020-10-24 ENCOUNTER — Other Ambulatory Visit: Payer: Self-pay

## 2020-10-24 VITALS — BP 170/87 | HR 72 | Ht 73.0 in | Wt 227.0 lb

## 2020-10-24 DIAGNOSIS — E782 Mixed hyperlipidemia: Secondary | ICD-10-CM | POA: Diagnosis not present

## 2020-10-24 DIAGNOSIS — K219 Gastro-esophageal reflux disease without esophagitis: Secondary | ICD-10-CM

## 2020-10-24 DIAGNOSIS — N400 Enlarged prostate without lower urinary tract symptoms: Secondary | ICD-10-CM | POA: Diagnosis not present

## 2020-10-24 DIAGNOSIS — Z0001 Encounter for general adult medical examination with abnormal findings: Secondary | ICD-10-CM | POA: Diagnosis not present

## 2020-10-24 DIAGNOSIS — Z0000 Encounter for general adult medical examination without abnormal findings: Secondary | ICD-10-CM

## 2020-10-24 DIAGNOSIS — N3281 Overactive bladder: Secondary | ICD-10-CM

## 2020-10-24 DIAGNOSIS — I10 Essential (primary) hypertension: Secondary | ICD-10-CM

## 2020-10-24 MED ORDER — PANTOPRAZOLE SODIUM 40 MG PO TBEC: DELAYED_RELEASE_TABLET | ORAL | 3 refills | Status: DC

## 2020-10-24 MED ORDER — ATORVASTATIN CALCIUM 40 MG PO TABS: ORAL_TABLET | ORAL | 3 refills | Status: DC

## 2020-10-24 MED ORDER — POTASSIUM CHLORIDE CRYS ER 10 MEQ PO TBCR: 10.0000 meq | EXTENDED_RELEASE_TABLET | Freq: Every day | ORAL | 3 refills | Status: DC

## 2020-10-24 MED ORDER — APIXABAN 5 MG PO TABS: 5.0000 mg | ORAL_TABLET | Freq: Two times a day (BID) | ORAL | 3 refills | Status: DC

## 2020-10-24 MED ORDER — HYDROCHLOROTHIAZIDE 25 MG PO TABS: 25.0000 mg | ORAL_TABLET | Freq: Every day | ORAL | 3 refills | Status: DC

## 2020-10-24 MED ORDER — AMLODIPINE BESYLATE 10 MG PO TABS: 10.0000 mg | ORAL_TABLET | Freq: Every day | ORAL | 3 refills | Status: DC

## 2020-10-24 MED ORDER — MIRABEGRON ER 50 MG PO TB24: 50.0000 mg | ORAL_TABLET | Freq: Every day | ORAL | 3 refills | Status: DC

## 2020-10-24 NOTE — Progress Notes (Signed)
BP (!) 170/87   Pulse 72   Ht '6\' 1"'$  (1.854 m)   Wt 227 lb (103 kg)   SpO2 96%   BMI 29.95 kg/m    Subjective:   Patient ID: Grant Altes., male    DOB: 05/30/1945, 75 y.o.   MRN: [id number]  HPI: Grant Borghese. is a 75 y.o. male presenting on 10/24/2020 for Medical Management of Chronic Issues and Hypertension   HPI Adult well exam and physical Patient is coming in complaining of adult well exam and physical and he does say he is having some urinary frequency and that sometimes he cannot make it to the restroom in time and wets himself.  This has been happening more over the past year.  Hypertension Patient is currently on hydrochlorothiazide and amlodipine, and their blood pressure today is 170/87 but he says it runs a lot better at home.. Patient denies any lightheadedness or dizziness. Patient denies headaches, blurred vision, chest pains, shortness of breath, or weakness. Denies any side effects from medication and is content with current medication.   Hyperlipidemia Patient is coming in for recheck of his hyperlipidemia. The patient is currently taking Lipitor. They deny any issues with myalgias or history of liver damage from it. They deny any focal numbness or weakness or chest pain.   Relevant past medical, surgical, family and social history reviewed and updated as indicated. Interim medical history since our last visit reviewed. Allergies and medications reviewed and updated.  Review of Systems  Constitutional:  Negative for chills and fever.  HENT:  Negative for ear pain and tinnitus.   Eyes:  Negative for pain and discharge.  Respiratory:  Negative for cough, shortness of breath and wheezing.   Cardiovascular:  Negative for chest pain, palpitations and leg swelling.  Gastrointestinal:  Negative for abdominal pain, blood in stool, constipation and diarrhea.  Genitourinary:  Positive for urgency. Negative for dysuria and hematuria.  Musculoskeletal:  Negative  for back pain, gait problem and myalgias.  Skin:  Negative for rash.  Neurological:  Negative for dizziness, weakness and headaches.  Psychiatric/Behavioral:  Negative for suicidal ideas.   All other systems reviewed and are negative.  Per HPI unless specifically indicated above   Allergies as of 10/24/2020       Reactions   Nsaids Other (See Comments)   Stomach bleed from radiation   Other         Medication List        Accurate as of October 24, 2020 11:06 AM. If you have any questions, ask your nurse or doctor.          amLODipine 10 MG tablet Commonly known as: NORVASC Take 1 tablet (10 mg total) by mouth daily.   apixaban 5 MG Tabs tablet Commonly known as: Eliquis Take 1 tablet (5 mg total) by mouth 2 (two) times daily. What changed: how much to take Changed by: Fransisca Kaufmann Grant Grunewald, MD   atorvastatin 40 MG tablet Commonly known as: LIPITOR TAKE ONE (1) TABLET EACH DAY   cholecalciferol 1000 units tablet Commonly known as: VITAMIN D Take 1,000 Units by mouth daily.   hydrochlorothiazide 25 MG tablet Commonly known as: HYDRODIURIL Take 1 tablet (25 mg total) by mouth daily.   mirabegron ER 50 MG Tb24 tablet Commonly known as: Myrbetriq Take 1 tablet (50 mg total) by mouth daily. Started by: Worthy Rancher, MD   multivitamin tablet Take 1 tablet by mouth daily.   pantoprazole  40 MG tablet Commonly known as: PROTONIX TAKE ONE TABLET BY MOUTH ONCE DAILY   potassium chloride 10 MEQ tablet Commonly known as: KLOR-CON Take 1 tablet (10 mEq total) by mouth daily.         Objective:   BP (!) 170/87   Pulse 72   Ht $R'6\' 1"'MU$  (1.854 m)   Wt 227 lb (103 kg)   SpO2 96%   BMI 29.95 kg/m   Wt Readings from Last 3 Encounters:  10/24/20 227 lb (103 kg)  04/21/20 223 lb (101.2 kg)  10/21/19 224 lb (101.6 kg)    Physical Exam Vitals and nursing note reviewed.  Constitutional:      General: He is not in acute distress.    Appearance: He is  well-developed. He is not diaphoretic.  HENT:     Right Ear: External ear normal.     Left Ear: External ear normal.     Nose: Nose normal.     Mouth/Throat:     Pharynx: No oropharyngeal exudate.  Eyes:     General: No scleral icterus.       Right eye: No discharge.     Conjunctiva/sclera: Conjunctivae normal.     Pupils: Pupils are equal, round, and reactive to light.  Neck:     Thyroid: No thyromegaly.  Cardiovascular:     Rate and Rhythm: Normal rate and regular rhythm.     Heart sounds: Normal heart sounds. No murmur heard. Pulmonary:     Effort: Pulmonary effort is normal. No respiratory distress.     Breath sounds: Normal breath sounds. No wheezing.  Abdominal:     General: Bowel sounds are normal. There is no distension.     Palpations: Abdomen is soft.     Tenderness: There is no abdominal tenderness. There is no guarding or rebound.  Musculoskeletal:        General: Normal range of motion.     Cervical back: Neck supple.  Lymphadenopathy:     Cervical: No cervical adenopathy.  Skin:    General: Skin is warm and dry.     Findings: No rash.  Neurological:     Mental Status: He is alert and oriented to person, place, and time.     Coordination: Coordination normal.  Psychiatric:        Behavior: Behavior normal.      Assessment & Plan:   Problem List Items Addressed This Visit       Cardiovascular and Mediastinum   Hypertension   Relevant Medications   amLODipine (NORVASC) 10 MG tablet   atorvastatin (LIPITOR) 40 MG tablet   apixaban (ELIQUIS) 5 MG TABS tablet   hydrochlorothiazide (HYDRODIURIL) 25 MG tablet   potassium chloride (KLOR-CON) 10 MEQ tablet   Other Relevant Orders   CMP14+EGFR     Genitourinary   BPH (benign prostatic hyperplasia)     Other   Hyperlipidemia   Relevant Medications   amLODipine (NORVASC) 10 MG tablet   atorvastatin (LIPITOR) 40 MG tablet   apixaban (ELIQUIS) 5 MG TABS tablet   hydrochlorothiazide (HYDRODIURIL) 25 MG  tablet   Other Relevant Orders   Lipid panel   Other Visit Diagnoses     Well adult exam    -  Primary   Essential hypertension       Relevant Medications   amLODipine (NORVASC) 10 MG tablet   atorvastatin (LIPITOR) 40 MG tablet   apixaban (ELIQUIS) 5 MG TABS tablet   hydrochlorothiazide (HYDRODIURIL) 25 MG tablet  potassium chloride (KLOR-CON) 10 MEQ tablet   Other Relevant Orders   CMP14+EGFR   Gastroesophageal reflux disease without esophagitis       Relevant Medications   pantoprazole (PROTONIX) 40 MG tablet   Other Relevant Orders   CBC with Differential/Platelet   Overactive bladder           Continue current medication, no changes except for we will add Myrbetriq to see if that helps with his urinary symptoms.  He is having some overactive bladder and that he cannot make it to the restroom is quick.  He denies any flow or stream issues.  Patient's blood pressure is elevated but says he is running in the 140s and 145 at home, he did not take his medicine this morning Follow up plan: Return in about 6 months (around 04/23/2021), or if symptoms worsen or fail to improve, for Hypertension and cholesterol recheck.  Counseling provided for all of the vaccine components Orders Placed This Encounter  Procedures   CBC with Differential/Platelet   CMP14+EGFR   Lipid panel    Caryl Pina, MD [facility] 10/24/2020, 11:06 AM

## 2020-10-25 LAB — LIPID PANEL
Chol/HDL Ratio: 4.2 ratio (ref 0.0–5.0)
Cholesterol, Total: 185 mg/dL (ref 100–199)
HDL: 44 mg/dL (ref >39)
LDL Chol Calc (NIH): 108 mg/dL — ABNORMAL HIGH (ref 0–99)
Triglycerides: 190 mg/dL — ABNORMAL HIGH (ref 0–149)
VLDL Cholesterol Cal: 33 mg/dL (ref 5–40)

## 2020-10-25 LAB — CBC WITH DIFFERENTIAL/PLATELET
Basophils Absolute: 0 10*3/uL (ref 0.0–0.2)
Basos: 0 %
EOS (ABSOLUTE): 0.1 10*3/uL (ref 0.0–0.4)
Eos: 2 %
Hematocrit: 39.8 % (ref 37.5–51.0)
Hemoglobin: 13.1 g/dL (ref 13.0–17.7)
Immature Grans (Abs): 0 10*3/uL (ref 0.0–0.1)
Immature Granulocytes: 0 %
Lymphocytes Absolute: 0.7 10*3/uL (ref 0.7–3.1)
Lymphs: 11 %
MCH: 30 pg (ref 26.6–33.0)
MCHC: 32.9 g/dL (ref 31.5–35.7)
MCV: 91 fL (ref 79–97)
Monocytes Absolute: 0.5 10*3/uL (ref 0.1–0.9)
Monocytes: 8 %
Neutrophils Absolute: 4.8 10*3/uL (ref 1.4–7.0)
Neutrophils: 79 %
Platelets: 202 10*3/uL (ref 150–450)
RBC: 4.36 x10E6/uL (ref 4.14–5.80)
RDW: 13.5 % (ref 11.6–15.4)
WBC: 6 10*3/uL (ref 3.4–10.8)

## 2020-10-25 LAB — CMP14+EGFR
ALT: 34 IU/L (ref 0–44)
AST: 34 IU/L (ref 0–40)
Albumin/Globulin Ratio: 2 (ref 1.2–2.2)
Albumin: 4.5 g/dL (ref 3.7–4.7)
Alkaline Phosphatase: 58 IU/L (ref 44–121)
BUN/Creatinine Ratio: 17 (ref 10–24)
BUN: 18 mg/dL (ref 8–27)
Bilirubin Total: 0.4 mg/dL (ref 0.0–1.2)
CO2: 24 mmol/L (ref 20–29)
Calcium: 9.3 mg/dL (ref 8.6–10.2)
Chloride: 103 mmol/L (ref 96–106)
Creatinine, Ser: 1.03 mg/dL (ref 0.76–1.27)
Globulin, Total: 2.3 g/dL (ref 1.5–4.5)
Glucose: 164 mg/dL — ABNORMAL HIGH (ref 65–99)
Potassium: 3.8 mmol/L (ref 3.5–5.2)
Sodium: 142 mmol/L (ref 134–144)
Total Protein: 6.8 g/dL (ref 6.0–8.5)
eGFR: 76 mL/min/{1.73_m2} (ref >59)

## 2020-11-02 DIAGNOSIS — M1712 Unilateral primary osteoarthritis, left knee: Secondary | ICD-10-CM | POA: Diagnosis not present

## 2020-11-09 DIAGNOSIS — M1712 Unilateral primary osteoarthritis, left knee: Secondary | ICD-10-CM | POA: Diagnosis not present

## 2020-11-16 DIAGNOSIS — M1712 Unilateral primary osteoarthritis, left knee: Secondary | ICD-10-CM | POA: Diagnosis not present

## 2020-12-18 DIAGNOSIS — Z9889 Other specified postprocedural states: Secondary | ICD-10-CM | POA: Diagnosis not present

## 2020-12-18 DIAGNOSIS — K8689 Other specified diseases of pancreas: Secondary | ICD-10-CM | POA: Diagnosis not present

## 2020-12-18 DIAGNOSIS — R918 Other nonspecific abnormal finding of lung field: Secondary | ICD-10-CM | POA: Diagnosis not present

## 2020-12-18 DIAGNOSIS — R7989 Other specified abnormal findings of blood chemistry: Secondary | ICD-10-CM | POA: Diagnosis not present

## 2020-12-18 DIAGNOSIS — D649 Anemia, unspecified: Secondary | ICD-10-CM | POA: Diagnosis not present

## 2020-12-18 DIAGNOSIS — K862 Cyst of pancreas: Secondary | ICD-10-CM | POA: Diagnosis not present

## 2020-12-18 DIAGNOSIS — C221 Intrahepatic bile duct carcinoma: Secondary | ICD-10-CM | POA: Diagnosis not present

## 2020-12-18 DIAGNOSIS — Z7901 Long term (current) use of anticoagulants: Secondary | ICD-10-CM | POA: Diagnosis not present

## 2020-12-18 DIAGNOSIS — I2699 Other pulmonary embolism without acute cor pulmonale: Secondary | ICD-10-CM | POA: Diagnosis not present

## 2021-01-15 DIAGNOSIS — L570 Actinic keratosis: Secondary | ICD-10-CM | POA: Diagnosis not present

## 2021-01-15 DIAGNOSIS — D0461 Carcinoma in situ of skin of right upper limb, including shoulder: Secondary | ICD-10-CM | POA: Diagnosis not present

## 2021-01-15 DIAGNOSIS — L820 Inflamed seborrheic keratosis: Secondary | ICD-10-CM | POA: Diagnosis not present

## 2021-01-15 DIAGNOSIS — D485 Neoplasm of uncertain behavior of skin: Secondary | ICD-10-CM | POA: Diagnosis not present

## 2021-01-15 DIAGNOSIS — D0422 Carcinoma in situ of skin of left ear and external auricular canal: Secondary | ICD-10-CM | POA: Diagnosis not present

## 2021-01-15 DIAGNOSIS — D0439 Carcinoma in situ of skin of other parts of face: Secondary | ICD-10-CM | POA: Diagnosis not present

## 2021-01-19 ENCOUNTER — Ambulatory Visit (INDEPENDENT_AMBULATORY_CARE_PROVIDER_SITE_OTHER): Payer: PPO | Admitting: *Deleted

## 2021-01-19 DIAGNOSIS — Z0000 Encounter for general adult medical examination without abnormal findings: Secondary | ICD-10-CM | POA: Diagnosis not present

## 2021-01-19 NOTE — Progress Notes (Signed)
MEDICARE ANNUAL WELLNESS VISIT  01/19/2021  Telephone Visit Disclaimer This Medicare AWV was conducted by telephone due to national recommendations for restrictions regarding the COVID-19 Pandemic (e.g. social distancing).  I verified, using two identifiers, that I am speaking with Grant Cummings. or their authorized healthcare agent. I discussed the limitations, risks, security, and privacy concerns of performing an evaluation and management service by telephone and the potential availability of an in-person appointment in the future. The patient expressed understanding and agreed to proceed.  Location of Patient: home Location of Provider (nurse): office  Subjective:    Grant Cummings. is a 75 y.o. male patient of Dettinger, Fransisca Kaufmann, MD who had a Medicare Annual Wellness Visit today via telephone. Gloyd is Retired and lives with their spouse. he has 3 children. he reports that he is socially active and does interact with friends/family regularly. he is minimally physically active and enjoys spending time with grandchildren.  Patient Care Team: Dettinger, Fransisca Kaufmann, MD as PCP - General (Family Medicine) Pearlie Oyster Estill Bamberg, MD as Consulting Physician (Specialist) Jimmy Footman, Meredith Staggers, MD as Referring Physician (Hematology and Oncology) Jyl Heinz, MD as Referring Physician (Surgery) Sandford Craze, MD as Referring Physician (Dermatology) Leticia Clas, [place] (Optometry)  Advanced Directives 01/19/2021 01/19/2020 12/17/2018 08/30/2017 12/09/2016 05/08/2015  Does Patient Have a Medical Advance Directive? Yes Yes Yes Yes Yes Yes  Type of Paramedic of [place];Living will;Out of facility DNR (pink MOST or yellow form) [place];Living will [place];Living will Healthcare Power of Attorney Living will;Healthcare Power of [place];Living will  Does patient want to make changes to medical advance  directive? No - Patient declined No - Patient declined No - Patient declined No - Patient declined No - Patient declined -  Copy of [place] in Chart? No - copy requested No - copy requested No - copy requested - No - copy requested No - copy requested  Would patient like information on creating a medical advance directive? - - - No - Patient declined - [facility] Utilization Over the Past 12 Months: # of hospitalizations or ER visits: 0 # of surgeries: 0  Review of Systems    Patient reports that his overall health is worse compared to last year.  History obtained from chart review and the patient  Patient Reported Readings (BP, Pulse, CBG, Weight, etc) none  Pain Assessment Pain : No/denies pain     Current Medications & Allergies (verified) Allergies as of 01/19/2021       Reactions   Nsaids Other (See Comments)   Stomach bleed from radiation   Other         Medication List        Accurate as of January 19, 2021  9:49 AM. If you have any questions, ask your nurse or doctor.          amLODipine 10 MG tablet Commonly known as: NORVASC Take 1 tablet (10 mg total) by mouth daily.   apixaban 5 MG Tabs tablet Commonly known as: Eliquis Take 1 tablet (5 mg total) by mouth 2 (two) times daily.   atorvastatin 40 MG tablet Commonly known as: LIPITOR TAKE ONE (1) TABLET EACH DAY   cholecalciferol 1000 units tablet Commonly known as: VITAMIN D Take 1,000 Units by mouth daily.   ferrous sulfate 325 (65 FE) MG tablet Take by mouth.   hydrochlorothiazide 25  MG tablet Commonly known as: HYDRODIURIL Take 1 tablet (25 mg total) by mouth daily.   mirabegron ER 50 MG Tb24 tablet Commonly known as: Myrbetriq Take 1 tablet (50 mg total) by mouth daily.   multivitamin tablet Take 1 tablet by mouth daily.   pantoprazole 40 MG tablet Commonly known as: PROTONIX TAKE ONE TABLET BY MOUTH ONCE DAILY   potassium chloride 10 MEQ tablet Commonly  known as: KLOR-CON M Take 1 tablet (10 mEq total) by mouth daily.        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[facility])    Past Surgical History:  Procedure Laterality Date   APPENDECTOMY     CARPAL TUNNEL WITH CUBITAL TUNNEL Right    CHOLECYSTECTOMY     EYE SURGERY Left 2017   lens implant to correct stigmatism / cataract   KNEE SURGERY     left   MELANOMA EXCISION Left 01/17/2020   partial liver removal     x 3   RADIOFREQUENCY ABLATION LIVER TUMOR     with biopsy  - NEG   Family History  Problem Relation Age of Onset   Alzheimer's disease Mother    Cancer Mother 63       breast   Breast cancer Mother    Alzheimer's disease Father    Cancer Father        lymph nodes- jaw   Hypertension Father    Heart disease Father        heart attack    Cancer Sister        ovarian   Hypertension Sister    GI problems Sister    Melanoma Daughter 55   Clotting disorder Paternal Grandfather    Social History   Socioeconomic History   Marital status: Married    Spouse name: Patsy   Number of children: 3   Years of education: 13   Highest education level: Some college, no degree  Occupational History   Occupation: retired    Comment: Print production planner   Occupation: Farm  Tobacco Use   Smoking status: Passive Smoke Exposure - Never Smoker   Smokeless tobacco: Never  Scientific laboratory technician Use: Never used  Substance and Sexual Activity   Alcohol use: No   Drug use: No   Sexual activity: Yes  Other Topics Concern   Not on file  Social History Narrative   Married and lives at home with his wife. They have 3 adult children who live in the local area. He has 7 grandchildren and 2 foster grandchildren. He picks up 2 of the grandkids from school daily. He is very involved with his  church and with his family. He has considered joining a gym but has not. He does stay busy at home. 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    Social Determinants of Health   Financial Resource Strain: Low Risk    Difficulty of Paying Living Expenses: Not hard at all  Food Insecurity: No Food Insecurity   Worried About Charity fundraiser in the Last Year: Never true   [place] in the Last Year: Never true  Transportation Needs: No Transportation Needs   Lack of Transportation (Medical): No  Lack of Transportation (Non-Medical): No  Physical Activity: Inactive   Days of Exercise per Week: 0 days   Minutes of Exercise per Session: 0 min  Stress: No Stress Concern Present   Feeling of Stress : Only a little  Social Connections: Moderately Integrated   Frequency of Communication with Friends and Family: More than three times a week   Frequency of Social Gatherings with Friends and Family: More than three times a week   Attends Religious Services: More than 4 times per year   Active Member of [employer] or Organizations: No   Attends Archivist Meetings: Never   Marital Status: Married    Activities of Daily Living In your present state of health, do you have any difficulty performing the following activities: 01/19/2021  Hearing? 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 Y  Difficulty concentrating or making decisions? N  Walking or climbing stairs? N  Dressing or bathing? N  Doing errands, shopping? N  Preparing Food and eating ? N  In the past six months, have you accidently leaked urine? N  Do you have problems with loss of bowel control? N  Managing your Medications? N  Managing your Finances? N  Housekeeping or managing your Housekeeping? N  Some recent data might be hidden    Patient Education/ Literacy How often do you need to have someone help you when you read instructions, pamphlets, or other written materials from your doctor or  pharmacy?: 1 - Never What is the last grade level you completed in school?: 13  Exercise Intensity: Mild, Exercise limited by: orthopedic condition(s)  Diet Patient reports consuming 3 meals a day and 0 snack(s) a day Patient reports that his primary diet is: Low Sodium Patient reports that she does have regular access to food.   Depression Screen PHQ 2/9 Scores 01/19/2021 10/24/2020 04/21/2020 01/19/2020 10/21/2019 12/17/2018 08/13/2018  PHQ - 2 Score 0 0 0 0 0 0 0     Fall Risk Fall Risk  01/19/2021 10/24/2020 04/21/2020 01/19/2020 10/21/2019  Falls in the past year? 0 0 0 0 1  Comment - - - - -  Number falls in past yr: - - - - 0  Injury with Fall? - - - - 0  Risk for fall due to : - - - - Impaired balance/gait  Follow up - - - - Falls evaluation completed     Objective:  Grant Cummings. seemed alert and oriented and he participated appropriately during our telephone visit.  Blood Pressure Weight BMI  BP Readings from Last 3 Encounters:  10/24/20 (!) 170/87  04/21/20 135/78  10/21/19 130/80   Wt Readings from Last 3 Encounters:  10/24/20 227 lb (103 kg)  04/21/20 223 lb (101.2 kg)  10/21/19 224 lb (101.6 kg)   BMI Readings from Last 1 Encounters:  10/24/20 29.95 kg/m    *Unable to obtain current vital signs, weight, and BMI due to telephone visit type  Hearing/Vision  Argelio did  seem to have difficulty with hearing/understanding during the telephone conversation Reports that he has had a formal eye exam by an eye care professional within the past year Reports that he has had a formal hearing evaluation within the past year *Unable to fully assess hearing and vision during telephone visit type  Cognitive Function: 6CIT Screen 01/19/2021 01/19/2020 12/17/2018 12/15/2017  What Year? 0 points 0 points 0 points 0 points  What month? 0 points 0 points 0 points  0 points  What time? 0 points 0 points 0 points 0 points  Count back from 20 0 points 0 points 0 points 0 points  Months  in reverse 0 points 0 points 0 points 0 points  Repeat phrase 0 points 0 points 0 points 0 points  Total Score 0 0 0 0   (Normal:0-7, Significant for Dysfunction: >8)  Normal Cognitive Function Screening: Yes   Immunization & Health Maintenance Record Immunization History  Administered Date(s) Administered   Fluad Quad(high Dose 65+) 01/21/2020, 12/19/2020   Influenza, High Dose Seasonal PF 12/01/2015, 12/09/2016, 12/15/2017, 01/02/2019   Influenza,inj,Quad PF,6+ Mos 12/21/2013, 11/22/2014   Influenza-Unspecified 02/14/2012   Moderna Sars-Covid-2 Vaccination 03/05/2019, 04/05/2019, 01/27/2020   Pneumococcal Conjugate-13 11/22/2014   Pneumococcal Polysaccharide-23 12/06/2012   Td 11/29/2016   Tdap 11/29/2016    Health Maintenance  Topic Date Due   Zoster Vaccines- Shingrix (1 of 2) 01/23/2021 (Originally 02/23/1964)   COVID-19 Vaccine (4 - Booster for Moderna series) 03/26/2021 (Originally 03/23/2020)   Hepatitis C Screening  11/30/2021 (Originally 02/24/1963)   TETANUS/TDAP  11/30/2026   COLONOSCOPY (Pts 45-13yrs Insurance coverage will need to be confirmed)  10/26/2028   Pneumonia Vaccine 39+ Years old  Completed   INFLUENZA VACCINE  Completed   HPV VACCINES  Aged Out       Assessment  This is a routine wellness examination for Grant Cummings.Marland Kitchen  Health Maintenance: Due or Overdue There are no preventive care reminders to display for this patient.  Grant Cummings. does not need a referral for Community Assistance: Care Management:   no Social Work:    no Prescription Assistance:  no Nutrition/Diabetes Education:  no   Plan:  Personalized Goals  Goals Addressed             This Visit's Progress    DIET - EAT MORE FRUITS AND VEGETABLES   On track    DIET - INCREASE WATER INTAKE   Not on track    DIET - INCREASE WATER INTAKE       Increase physical activity       Reduce sodium intake   On track    Reduce sugar intake to X grams per day   Not on track       Personalized Health Maintenance & Screening Recommendations  shingrix  Lung Cancer Screening Recommended: no (Low Dose CT Chest recommended if Age 45-80 years, 30 pack-year currently smoking OR have quit w/in past 15 years) Hepatitis C Screening recommended: yes HIV Screening recommended: no  Advanced Directives: Written information was not prepared per patient's request.  Referrals & Orders No orders of the defined types were placed in this encounter.   Follow-up Plan Follow-up with Dettinger, Fransisca Kaufmann, MD as planned on03/13/23 Pt is independent with all ADL's. He has slowed down some, awaiting knee surgery. Pt has good vision. 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 Pt is due for shingrix vaccine. Pt has a living will and POA, copies requested for our records. AVS printed and mailed to pt.    I have personally reviewed and noted the following in the patient's chart:   Medical and social history Use of alcohol, tobacco or illicit drugs  Current medications and supplements Functional ability and status Nutritional status Physical activity Advanced directives List of other physicians Hospitalizations, surgeries, and ER visits in previous 12 months Vitals Screenings to include cognitive, depression, and falls Referrals and appointments  In  addition, I have reviewed and discussed with Grant Cummings. certain preventive protocols, quality metrics, and best practice recommendations. A written personalized care plan for preventive services as well as general preventive health recommendations is available and can be mailed to the patient at his request.      Rana Snare, LPN  [id number]

## 2021-01-23 DIAGNOSIS — C44229 Squamous cell carcinoma of skin of left ear and external auricular canal: Secondary | ICD-10-CM | POA: Diagnosis not present

## 2021-03-01 DIAGNOSIS — M1712 Unilateral primary osteoarthritis, left knee: Secondary | ICD-10-CM | POA: Diagnosis not present

## 2021-04-05 ENCOUNTER — Ambulatory Visit (INDEPENDENT_AMBULATORY_CARE_PROVIDER_SITE_OTHER): Payer: PPO | Admitting: Family Medicine

## 2021-04-05 ENCOUNTER — Encounter: Payer: Self-pay | Admitting: Family Medicine

## 2021-04-05 VITALS — BP 135/76 | HR 69 | Ht 73.0 in | Wt 222.0 lb

## 2021-04-05 DIAGNOSIS — Z01818 Encounter for other preprocedural examination: Secondary | ICD-10-CM

## 2021-04-05 DIAGNOSIS — M1712 Unilateral primary osteoarthritis, left knee: Secondary | ICD-10-CM | POA: Diagnosis not present

## 2021-04-05 LAB — COAGUCHEK XS/INR WAIVED
INR: 1.2 — ABNORMAL HIGH (ref 0.9–1.1)
Prothrombin Time: 14.7 s

## 2021-04-05 NOTE — Progress Notes (Signed)
° °BP 135/76    Pulse 69    Ht 6' 1" (1.854 m)    Wt 222 lb (100.7 kg)    SpO2 98%    BMI 29.29 kg/m²   ° °Subjective:  ° °Patient ID: Grant H Spruell Jr., male    DOB: 11/29/1945, 76 y.o.   MRN: [id number] ° °HPI: °Grant H Quang Jr. is a 76 y.o. male presenting on 04/05/2021 for Surgical clearance ° ° °HPI °Patient is coming in today for preoperative clearance exam.  He is going for knee replacement possibly in June of his left knee.  He denies any chest pain or palpitations.  He is able to walk up stairs and walk distances without being short of breath or having chest pains.  He has a METS of greater than 8.  His left knee is the one that bothers him. ° °Relevant past medical, surgical, family and social history reviewed and updated as indicated. Interim medical history since our last visit reviewed. °Allergies and medications reviewed and updated. ° °Review of Systems  °Constitutional:  Negative for chills and fever.  °HENT:  Negative for ear pain and tinnitus.   °Eyes:  Negative for pain and visual disturbance.  °Respiratory:  Negative for cough, shortness of breath and wheezing.   °Cardiovascular:  Negative for chest pain, palpitations and leg swelling.  °Gastrointestinal:  Negative for abdominal pain, blood in stool, constipation and diarrhea.  °Genitourinary:  Negative for dysuria and hematuria.  °Musculoskeletal:  Positive for arthralgias. Negative for back pain, gait problem and myalgias.  °Skin:  Negative for rash.  °Neurological:  Negative for dizziness, weakness, numbness and headaches.  °Psychiatric/Behavioral:  Negative for suicidal ideas.   °All other systems reviewed and are negative. ° °Per HPI unless specifically indicated above ° ° °Allergies as of 04/05/2021   ° °   Reactions  ° Nsaids Other (See Comments)  ° Stomach bleed from radiation  ° Other   ° °  ° °  °Medication List  °  ° °  ° Accurate as of April 05, 2021 12:39 PM. If you have any questions, ask your nurse or doctor.  °  °  ° °  ° °STOP  taking these medications   ° °mirabegron ER 50 MG Tb24 tablet °Commonly known as: Myrbetriq °Stopped by: Joshua A Dettinger, MD °  ° °  ° °TAKE these medications   ° °amLODipine 10 MG tablet °Commonly known as: NORVASC °Take 1 tablet (10 mg total) by mouth daily. °  °apixaban 5 MG Tabs tablet °Commonly known as: Eliquis °Take 1 tablet (5 mg total) by mouth 2 (two) times daily. °  °atorvastatin 40 MG tablet °Commonly known as: LIPITOR °TAKE ONE (1) TABLET EACH DAY °  °cholecalciferol 1000 units tablet °Commonly known as: VITAMIN D °Take 1,000 Units by mouth daily. °  °ferrous sulfate 325 (65 FE) MG tablet °Take by mouth. °  °hydrochlorothiazide 25 MG tablet °Commonly known as: HYDRODIURIL °Take 1 tablet (25 mg total) by mouth daily. °  °multivitamin tablet °Take 1 tablet by mouth daily. °  °pantoprazole 40 MG tablet °Commonly known as: PROTONIX °TAKE ONE TABLET BY MOUTH ONCE DAILY °  °potassium chloride 10 MEQ tablet °Commonly known as: KLOR-CON M °Take 1 tablet (10 mEq total) by mouth daily. °  ° °  ° ° ° °Objective:  ° °BP 135/76    Pulse 69    Ht 6' 1" (1.854 m)    Wt 222 lb (100.7 kg)      SpO2 98%    BMI 29.29 kg/m²   °Wt Readings from Last 3 Encounters:  °04/05/21 222 lb (100.7 kg)  °10/24/20 227 lb (103 kg)  °04/21/20 223 lb (101.2 kg)  °  °Physical Exam °Vitals reviewed.  °Constitutional:   °   General: He is not in acute distress. °   Appearance: He is well-developed. He is not diaphoretic.  °HENT:  °   Right Ear: External ear normal.  °   Left Ear: External ear normal.  °   Nose: Nose normal.  °   Mouth/Throat:  °   Pharynx: No oropharyngeal exudate.  °Eyes:  °   General: No scleral icterus.    °   Right eye: No discharge.  °   Conjunctiva/sclera: Conjunctivae normal.  °   Pupils: Pupils are equal, round, and reactive to light.  °Neck:  °   Thyroid: No thyromegaly.  °Cardiovascular:  °   Rate and Rhythm: Normal rate and regular rhythm.  °   Heart sounds: Normal heart sounds. No murmur heard. °Pulmonary:  °    Effort: Pulmonary effort is normal. No respiratory distress.  °   Breath sounds: Normal breath sounds. No wheezing.  °Abdominal:  °   General: Bowel sounds are normal. There is no distension.  °   Palpations: Abdomen is soft.  °   Tenderness: There is no abdominal tenderness. There is no guarding or rebound.  °Musculoskeletal:     °   General: Normal range of motion.  °   Cervical back: Neck supple.  °Lymphadenopathy:  °   Cervical: No cervical adenopathy.  °Skin: °   General: Skin is warm and dry.  °   Findings: No rash.  °Neurological:  °   Mental Status: He is alert and oriented to person, place, and time.  °   Coordination: Coordination normal.  °Psychiatric:     °   Behavior: Behavior normal.  ° ° ° ° °Assessment & Plan:  ° °Problem List Items Addressed This Visit   °None °Visit Diagnoses   ° ° Preoperative clearance    -  Primary  ° Relevant Orders  ° CoaguChek XS/INR Waived  ° EKG 12-Lead (Completed)  ° CBC with Differential/Platelet  ° CMP14+EGFR  ° Primary osteoarthritis of left knee      ° °  °  °EKG shows normal sinus rhythm with heart rate of 66.  No ST or Q-wave abnormalities noted. ° °As long as blood work looks good patient will be cleared for surgery °Follow up plan: °Return if symptoms worsen or fail to improve. ° °Counseling provided for all of the vaccine components °Orders Placed This Encounter  °Procedures  ° CoaguChek XS/INR Waived  ° CBC with Differential/Platelet  ° CMP14+EGFR  ° EKG 12-Lead  ° ° °Joshua Dettinger, MD °[facility] °04/05/2021, 12:39 PM ° ° ° ° °

## 2021-04-06 LAB — CMP14+EGFR
ALT: 43 IU/L (ref 0–44)
AST: 33 IU/L (ref 0–40)
Albumin/Globulin Ratio: 2 (ref 1.2–2.2)
Albumin: 4.2 g/dL (ref 3.7–4.7)
Alkaline Phosphatase: 64 IU/L (ref 44–121)
BUN/Creatinine Ratio: 19 (ref 10–24)
BUN: 19 mg/dL (ref 8–27)
Bilirubin Total: 0.4 mg/dL (ref 0.0–1.2)
CO2: 26 mmol/L (ref 20–29)
Calcium: 9.4 mg/dL (ref 8.6–10.2)
Chloride: 104 mmol/L (ref 96–106)
Creatinine, Ser: 1.01 mg/dL (ref 0.76–1.27)
Globulin, Total: 2.1 g/dL (ref 1.5–4.5)
Glucose: 109 mg/dL — ABNORMAL HIGH (ref 70–99)
Potassium: 4.1 mmol/L (ref 3.5–5.2)
Sodium: 142 mmol/L (ref 134–144)
Total Protein: 6.3 g/dL (ref 6.0–8.5)
eGFR: 78 mL/min/{1.73_m2} (ref >59)

## 2021-04-06 LAB — CBC WITH DIFFERENTIAL/PLATELET
Basophils Absolute: 0 10*3/uL (ref 0.0–0.2)
Basos: 0 %
EOS (ABSOLUTE): 0.1 10*3/uL (ref 0.0–0.4)
Eos: 1 %
Hematocrit: 44.4 % (ref 37.5–51.0)
Hemoglobin: 14.9 g/dL (ref 13.0–17.7)
Immature Grans (Abs): 0 10*3/uL (ref 0.0–0.1)
Immature Granulocytes: 0 %
Lymphocytes Absolute: 0.7 10*3/uL (ref 0.7–3.1)
Lymphs: 12 %
MCH: 32.8 pg (ref 26.6–33.0)
MCHC: 33.6 g/dL (ref 31.5–35.7)
MCV: 98 fL — ABNORMAL HIGH (ref 79–97)
Monocytes Absolute: 0.6 10*3/uL (ref 0.1–0.9)
Monocytes: 10 %
Neutrophils Absolute: 4.5 10*3/uL (ref 1.4–7.0)
Neutrophils: 77 %
Platelets: 199 10*3/uL (ref 150–450)
RBC: 4.54 x10E6/uL (ref 4.14–5.80)
RDW: 14.1 % (ref 11.6–15.4)
WBC: 5.9 10*3/uL (ref 3.4–10.8)

## 2021-04-23 ENCOUNTER — Ambulatory Visit: Payer: PPO | Admitting: Family Medicine

## 2021-05-16 DIAGNOSIS — L570 Actinic keratosis: Secondary | ICD-10-CM | POA: Diagnosis not present

## 2021-05-16 DIAGNOSIS — D485 Neoplasm of uncertain behavior of skin: Secondary | ICD-10-CM | POA: Diagnosis not present

## 2021-05-16 DIAGNOSIS — B078 Other viral warts: Secondary | ICD-10-CM | POA: Diagnosis not present

## 2021-05-25 DIAGNOSIS — K635 Polyp of colon: Secondary | ICD-10-CM | POA: Diagnosis not present

## 2021-05-25 DIAGNOSIS — K317 Polyp of stomach and duodenum: Secondary | ICD-10-CM | POA: Diagnosis not present

## 2021-05-25 DIAGNOSIS — K648 Other hemorrhoids: Secondary | ICD-10-CM | POA: Diagnosis not present

## 2021-05-25 DIAGNOSIS — D122 Benign neoplasm of ascending colon: Secondary | ICD-10-CM | POA: Diagnosis not present

## 2021-05-25 DIAGNOSIS — Z86010 Personal history of colonic polyps: Secondary | ICD-10-CM | POA: Diagnosis not present

## 2021-05-25 DIAGNOSIS — D509 Iron deficiency anemia, unspecified: Secondary | ICD-10-CM | POA: Diagnosis not present

## 2021-05-25 DIAGNOSIS — D124 Benign neoplasm of descending colon: Secondary | ICD-10-CM | POA: Diagnosis not present

## 2021-05-25 DIAGNOSIS — Z8509 Personal history of malignant neoplasm of other digestive organs: Secondary | ICD-10-CM | POA: Diagnosis not present

## 2021-05-25 DIAGNOSIS — K621 Rectal polyp: Secondary | ICD-10-CM | POA: Diagnosis not present

## 2021-05-25 DIAGNOSIS — Z1211 Encounter for screening for malignant neoplasm of colon: Secondary | ICD-10-CM | POA: Diagnosis not present

## 2021-05-25 DIAGNOSIS — Z8719 Personal history of other diseases of the digestive system: Secondary | ICD-10-CM | POA: Diagnosis not present

## 2021-05-25 DIAGNOSIS — D125 Benign neoplasm of sigmoid colon: Secondary | ICD-10-CM | POA: Diagnosis not present

## 2021-05-25 DIAGNOSIS — K2950 Unspecified chronic gastritis without bleeding: Secondary | ICD-10-CM | POA: Diagnosis not present

## 2021-06-11 DIAGNOSIS — K7689 Other specified diseases of liver: Secondary | ICD-10-CM | POA: Diagnosis not present

## 2021-06-11 DIAGNOSIS — I2699 Other pulmonary embolism without acute cor pulmonale: Secondary | ICD-10-CM | POA: Diagnosis not present

## 2021-06-11 DIAGNOSIS — R911 Solitary pulmonary nodule: Secondary | ICD-10-CM | POA: Diagnosis not present

## 2021-06-11 DIAGNOSIS — C221 Intrahepatic bile duct carcinoma: Secondary | ICD-10-CM | POA: Diagnosis not present

## 2021-06-21 DIAGNOSIS — M25562 Pain in left knee: Secondary | ICD-10-CM | POA: Diagnosis not present

## 2021-06-21 NOTE — H&P (Addendum)
TOTAL KNEE ADMISSION H&P ? ?Patient is being admitted for left total knee arthroplasty. ? ?Subjective: ? ?Chief Complaint: Left knee pain. ? ?HPI: Grant Altes., 76 y.o. male has a history of pain and functional disability in the left knee due to arthritis and has failed non-surgical conservative treatments for greater than 12 weeks to include NSAID's and/or analgesics, corticosteriod injections, viscosupplementation injections, weight reduction as appropriate, and activity modification. Onset of symptoms was gradual, starting several years ago with gradually worsening course since that time. The patient noted no past surgery on the left knee.  Patient currently rates pain in the left knee at 7 out of 10 with activity. Patient has night pain, worsening of pain with activity and weight bearing, and pain with passive range of motion. Patient has evidence of  bone-on-bone arthritis in the medial and patellofemoral compartments  by imaging studies. There is no active infection. ? ?Patient Active Problem List  ? Diagnosis Date Noted  ? Personal history of PE (pulmonary embolism) 08/30/2017  ? Blood type A- 05/08/2015  ? Hernia of abdominal wall 05/08/2015  ? Edema 11/22/2014  ? BPH (benign prostatic hyperplasia) 03/22/2013  ? Vitamin D deficiency 03/22/2013  ? Hearing deficit 03/22/2013  ? Liver cancer ([place])   ? Hyperlipidemia   ? Hypertension   ? ? ?Past Medical History:  ?Diagnosis Date  ? Asbestos exposure   ? Cataract   ? Hyperlipidemia   ? Hypertension   ? 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 PE (pulmonary thromboembolism) ([place])   ? [place] pulmonary embolus ([facility])   ? ? ?Past Surgical History:  ?Procedure Laterality Date  ? APPENDECTOMY    ? CARPAL TUNNEL WITH CUBITAL TUNNEL Right   ? CHOLECYSTECTOMY    ? EYE SURGERY Left 2017  ? lens implant to correct stigmatism / cataract  ? KNEE SURGERY    ? left  ? MELANOMA EXCISION Left 01/17/2020  ? partial liver removal    ? x 3  ? RADIOFREQUENCY ABLATION LIVER  TUMOR    ? with biopsy  - NEG  ? ? ?Prior to Admission medications   ?Medication Sig Start Date End Date Taking? Authorizing Provider  ?amLODipine (NORVASC) 10 MG tablet Take 1 tablet (10 mg total) by mouth daily. 10/24/20   Dettinger, Fransisca Kaufmann, MD  ?apixaban (ELIQUIS) 5 MG TABS tablet Take 1 tablet (5 mg total) by mouth 2 (two) times daily. 10/24/20   Dettinger, Fransisca Kaufmann, MD  ?atorvastatin (LIPITOR) 40 MG tablet TAKE ONE (1) TABLET EACH DAY 10/24/20   Dettinger, Fransisca Kaufmann, MD  ?cholecalciferol (VITAMIN D) 1000 UNITS tablet Take 1,000 Units by mouth daily.    [provider]  ?ferrous sulfate 325 (65 FE) MG tablet Take by mouth. 12/19/20   [provider]  ?hydrochlorothiazide (HYDRODIURIL) 25 MG tablet Take 1 tablet (25 mg total) by mouth daily. 10/24/20   Dettinger, Fransisca Kaufmann, MD  ?Multiple Vitamin (MULTIVITAMIN) tablet Take 1 tablet by mouth daily.    [provider]  ?pantoprazole (PROTONIX) 40 MG tablet TAKE ONE TABLET BY MOUTH ONCE DAILY 10/24/20   Dettinger, Fransisca Kaufmann, MD  ?potassium chloride (KLOR-CON) 10 MEQ tablet Take 1 tablet (10 mEq total) by mouth daily. 10/24/20   Dettinger, Fransisca Kaufmann, MD  ? ? ?Allergies  ?Allergen Reactions  ? Nsaids Other (See Comments)  ?  Stomach bleed from radiation  ? Other   ? ? ?Social History  ? ?Socioeconomic History  ? Marital status: Married  ?  Spouse name: Patsy  ? Number of children: 3  ? Years of education: 57  ? Highest education level: Some college, no degree  ?Occupational History  ? Occupation: retired  ?  Comment: [employer]  ? Occupation: Farm  ?Tobacco Use  ? Smoking status: Passive Smoke Exposure - Never Smoker  ? Smokeless tobacco: Never  ?Vaping Use  ? Vaping Use: Never used  ?Substance and Sexual Activity  ? Alcohol use: No  ? Drug use: No  ? Sexual activity: Yes  ?Other Topics Concern  ? Not on file  ?Social History Narrative  ? Married and lives at home with his wife. They have 3 adult children who live in the local area. He has 7  grandchildren and 2 foster grandchildren. He picks up 2 of the grandkids from school daily. He is very involved with his church and with his family. He has considered joining a gym but has not. He does stay busy at home. 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   ? ?Social Determinants of Health  ? ?Financial Resource Strain: Low Risk   ? Difficulty of Paying Living Expenses: Not hard at all  ?Food Insecurity: No Food Insecurity  ? Worried About Charity fundraiser in the Last Year: Never true  ? Ran Out of Food in the Last Year: Never true  ?Transportation Needs: No Transportation Needs  ? Lack of Transportation (Medical): No  ? Lack of Transportation (Non-Medical): No  ?Physical Activity: Inactive  ? Days of Exercise per Week: 0 days  ? Minutes of Exercise per Session: 0 min  ?Stress: No Stress Concern Present  ? Feeling of Stress : Only a little  ?Social Connections: Moderately Integrated  ? Frequency of Communication with Friends and Family: More than three times a week  ? Frequency of Social Gatherings with Friends and Family: More than three times a week  ? Attends Religious Services: More than 4 times per year  ? Active Member of Clubs or Organizations: No  ? Attends Archivist Meetings: Never  ? Marital Status: Married  ?Intimate Partner Violence: Not At Risk  ? Fear of Current or Ex-Partner: No  ? Emotionally Abused: No  ? Physically Abused: No  ? Sexually Abused: No  ? ? ?Tobacco Use: Medium Risk  ? Smoking Tobacco Use: Passive Smoke Exposure - Never Smoker  ? Smokeless Tobacco Use: Never  ? Passive Exposure: Yes  ? ?Social History  ? ?Substance and Sexual Activity  ?Alcohol Use No  ? ? ?Family History  ?Problem Relation Age of Onset  ? Alzheimer's disease Mother   ? Cancer Mother 82  ?     breast  ? Breast cancer Mother   ? Alzheimer's disease Father   ? Cancer Father   ?     lymph nodes- jaw  ? Hypertension Father   ? Heart disease Father   ?     heart attack   ? Cancer Sister   ?      ovarian  ? Hypertension Sister   ? GI problems Sister   ? Melanoma Daughter 50  ? Clotting disorder Paternal Grandfather   ? ? ?Review of Systems  ?Constitutional:  Negative for chills and fever.  ?HENT: Negative.    ?Eyes: Negative.   ?Respiratory:  Negative for cough and shortness of breath.   ?Cardiovascular:  Negative for chest pain and palpitations.  ?Gastrointestinal:  Negative for abdominal pain, constipation, diarrhea, nausea and  vomiting.  ?Genitourinary:  Negative for dysuria, frequency and urgency.  ?Musculoskeletal:  Positive for joint pain.  ?Skin:  Negative for rash.  ? ?Objective: ? ?Physical Exam: ?Well nourished and well developed.  ?General: Alert and oriented x3, cooperative and pleasant, no acute distress.  ?Head: normocephalic, atraumatic, neck supple.  ?Eyes: EOMI.  ?Respiratory: breath sounds clear in all fields, no wheezing, rales, or rhonchi. ?Cardiovascular: Regular rate and rhythm, no murmurs, gallops or rubs.  ?Abdomen: non-tender to palpation and soft, normoactive bowel sounds. ?Musculoskeletal: ?Nonantalgic gait without using assisted devices. ? ?Right Knee Exam: ? No effusion. ? Range of motion is 0-125 degrees. ? No crepitus on range of motion of the knee. ? No medial or lateral joint line tenderness. ? Stable knee. ? ?Left Knee Exam: ? Slight varus deformity. ? No effusion. ? Range of motion is 7 to 125 degrees. ? Moderate crepitus on range of motion of the knee. ? Positive medial joint line tenderness. ? No lateral joint line tenderness. ? Stable knee. ? ?Calves soft and nontender. Motor function intact in LE. Strength 5/5 LE bilaterally. ?Neuro: Distal pulses 2+. Sensation to light touch intact in LE. ? ?Vital signs in last 24 hours: ?BP: ()/()  ?Arterial Line BP: ()/()  ? ?Imaging Review ?Plain radiographs demonstrate moderate degenerative joint disease of the left knee. The overall alignment is mild varus. The bone quality appears to be adequate for age and reported activity  level. ? ?Assessment/Plan: ? ?End stage arthritis, left knee  ? ?The patient history, physical examination, clinical judgment of the provider and imaging studies are consistent with end stage degenerative joint diseas

## 2021-07-03 NOTE — Patient Instructions (Signed)
DUE TO COVID-19 ONLY TWO VISITORS  (aged 75 and older)  IS ALLOWED TO COME WITH YOU AND STAY IN THE WAITING ROOM ONLY DURING PRE OP AND PROCEDURE.   **NO VISITORS ARE ALLOWED IN THE SHORT STAY AREA OR RECOVERY ROOM!!**  IF YOU WILL BE ADMITTED INTO THE HOSPITAL YOU ARE ALLOWED ONLY FOUR SUPPORT PEOPLE DURING VISITATION HOURS ONLY (7 AM -8PM)   The support person(s) must pass our screening, gel in and out Visitors GUEST BADGE MUST BE WORN VISIBLY  One adult visitor may remain with you overnight and MUST be in the room by 8 P.M.   You are not required to [employer] often Do NOT share personal items Notify your provider if you are in close contact with someone who has COVID or you develop fever 100.4 or greater, new onset of sneezing, cough, sore throat, shortness of breath or body aches.        Your procedure is scheduled on:  07-16-21   Report to [facility] Main Entrance    Report to admitting at 7:45 AM   Call this number if you have problems the morning of surgery [phone]   Do not eat food :After Midnight.   After Midnight you may have the following liquids until 7:30 AM DAY OF SURGERY  Water Black Coffee (sugar ok, NO MILK/CREAM OR CREAMERS)  Tea (sugar ok, NO MILK/CREAM OR CREAMERS) regular and decaf                             Plain Jell-O (NO RED)                                           Fruit ices (not with fruit pulp, NO RED)                                     Popsicles (NO RED)                                                                  Juice: apple, WHITE grape, WHITE cranberry Sports drinks like Gatorade (NO RED) Clear broth(vegetable,chicken,beef)                   The day of surgery:  Drink ONE (1) Pre-Surgery Clear Ensure at 7:30 AM the morning of surgery. Drink in one sitting. Do not sip.  This drink was given to you during your hospital  pre-op appointment visit. Nothing else to drink after completing the Pre-Surgery Clear  Ensure           If you have questions, please contact your surgeon's office.   FOLLOW  ANY ADDITIONAL PRE OP INSTRUCTIONS YOU RECEIVED FROM YOUR SURGEON'S OFFICE!!!     Oral Hygiene is also important to reduce your risk of infection.                                    Remember -  BRUSH YOUR TEETH THE MORNING OF SURGERY WITH YOUR REGULAR TOOTHPASTE   Do NOT smoke after Midnight   Take these medicines the morning of surgery with A SIP OF WATER:  Tylenol, Amlodipine, Flonase nasal spray, Pantoprazole, allergy eyedrops   Eliquis - Check with physician on when to stop                              You may not have any metal on your body including  jewelry, and body piercing             Do not wear lotions, powders, cologne, or deodorant              Men may shave face and neck.   Do not bring valuables to the hospital. [place].   Contacts, dentures or bridgework may not be worn into surgery.   Bring small overnight bag day of surgery.   Special Instructions: Bring a copy of your healthcare power of attorney and living will documents the day of surgery if you haven't scanned them before.  Please read over the following fact sheets you were given: IF YOU HAVE QUESTIONS ABOUT YOUR PRE-OP INSTRUCTIONS PLEASE CALL [place] - Preparing for Surgery Before surgery, you can play an important role.  Because skin is not sterile, your skin needs to be as free of germs as possible.  You can reduce the number of germs on your skin by washing with CHG (chlorahexidine gluconate) soap before surgery.  CHG is an antiseptic cleaner which kills germs and bonds with the skin to continue killing germs even after washing. Please DO NOT use if you have an allergy to CHG or antibacterial soaps.  If your skin becomes reddened/irritated stop using the CHG and inform your nurse when you arrive at Short Stay. Do not shave (including legs and underarms)  for at least 48 hours prior to the first CHG shower.  You may shave your face/neck.  Please follow these instructions carefully:  1.  Shower with CHG Soap the night before surgery and the  morning of surgery.  2.  If you choose to wash your hair, wash your hair first as usual with your normal  shampoo.  3.  After you shampoo, rinse your hair and body thoroughly to remove the shampoo.                             4.  Use CHG as you would any other liquid soap.  You can apply chg directly to the skin and wash.  Gently with a scrungie or clean washcloth.  5.  Apply the CHG Soap to your body ONLY FROM THE NECK DOWN.   Do   not use on face/ open                           Wound or open sores. Avoid contact with eyes, ears mouth and   genitals (private parts).                       Wash face,  Genitals (private parts) with your normal soap.             6.  Wash thoroughly, paying special attention to the area where your  surgery  will be performed.  7.  Thoroughly rinse your body with warm water from the neck down.  8.  DO NOT shower/wash with your normal soap after using and rinsing off the CHG Soap.                9.  Pat yourself dry with a clean towel.            10.  Wear clean pajamas.            11.  Place clean sheets on your bed the night of your first shower and do not  sleep with pets. Day of Surgery : Do not apply any lotions/deodorants the morning of surgery.  Please wear clean clothes to the hospital/surgery center.  FAILURE TO FOLLOW THESE INSTRUCTIONS MAY RESULT IN THE CANCELLATION OF YOUR SURGERY  PATIENT SIGNATURE_________________________________  NURSE SIGNATURE__________________________________  ________________________________________________________________________     Grant Cummings  An incentive spirometer is a tool that can help keep your lungs clear and active. This tool measures how well you are filling your lungs with each breath. Taking long deep breaths may  help reverse or decrease the chance of developing breathing (pulmonary) problems (especially infection) following: A long period of time when you are unable to move or be active. BEFORE THE PROCEDURE  If the spirometer includes an indicator to show your best effort, your nurse or respiratory therapist will set it to a desired goal. If possible, sit up straight or lean slightly forward. Try not to slouch. Hold the incentive spirometer in an upright position. INSTRUCTIONS FOR USE  Sit on the edge of your bed if possible, or sit up as far as you can in bed or on a chair. Hold the incentive spirometer in an upright position. Breathe out normally. Place the mouthpiece in your mouth and seal your lips tightly around it. Breathe in slowly and as deeply as possible, raising the piston or the ball toward the top of the column. Hold your breath for 3-5 seconds or for as long as possible. Allow the piston or ball to fall to the bottom of the column. Remove the mouthpiece from your mouth and breathe out normally. Rest for a few seconds and repeat Steps 1 through 7 at least 10 times every 1-2 hours when you are awake. Take your time and take a few normal breaths between deep breaths. The spirometer may include an indicator to show your best effort. Use the indicator as a goal to work toward during each repetition. After each set of 10 deep breaths, practice coughing to be sure your lungs are clear. If you have an incision (the cut made at the time of surgery), support your incision when coughing by placing a pillow or rolled up towels firmly against it. Once you are able to get out of bed, walk around indoors and cough well. You may stop using the incentive spirometer when instructed by your caregiver.  RISKS AND COMPLICATIONS Take your time so you do not get dizzy or light-headed. If you are in pain, you may need to take or ask for pain medication before doing incentive spirometry. It is harder to take a  deep breath if you are having pain. AFTER USE Rest and breathe slowly and easily. It can be helpful to keep track of a log of your progress. Your caregiver can provide you with a simple table to help with this. If you are using the spirometer at home, follow these instructions: [facility]  CARE IF:  You are having difficultly using the spirometer. You have trouble using the spirometer as often as instructed. Your pain medication is not giving enough relief while using the spirometer. You develop fever of 100.5 F (38.1 C) or higher. SEEK IMMEDIATE MEDICAL CARE IF:  You cough up bloody sputum that had not been present before. You develop fever of 102 F (38.9 C) or greater. You develop worsening pain at or near the incision site. MAKE SURE YOU:  Understand these instructions. Will watch your condition. Will get help right away if you are not doing well or get worse. Document Released: 06/10/2006 Document Revised: 04/22/2011 Document Reviewed: 08/11/2006 Exit[employer] Patient Information 2014 Exit[employer], [place].   ________________________________________________________________________  WHAT IS A BLOOD TRANSFUSION? Blood Transfusion Information  A transfusion is the replacement of blood or some of its parts. Blood is made up of multiple cells which provide different functions. Red blood cells carry oxygen and are used for blood loss replacement. White blood cells fight against infection. Platelets control bleeding. Plasma helps clot blood. Other blood products are available for specialized needs, such as hemophilia or other clotting disorders. BEFORE THE TRANSFUSION  Who gives blood for transfusions?  Healthy volunteers who are fully evaluated to make sure their blood is safe. This is blood bank blood. Transfusion therapy is the safest it has ever been in the practice of medicine. Before blood is taken from a donor, a complete history is taken to make sure that person has no history of  diseases nor engages in risky social behavior (examples are intravenous drug use or sexual activity with multiple partners). The donor's travel history is screened to minimize risk of transmitting infections, such as malaria. The donated blood is tested for signs of infectious diseases, such as HIV and hepatitis. The blood is then tested to be sure it is compatible with you in order to minimize the chance of a transfusion reaction. If you or a relative donates blood, this is often done in anticipation of surgery and is not appropriate for emergency situations. It takes many days to process the donated blood. RISKS AND COMPLICATIONS Although transfusion therapy is very safe and saves many lives, the main dangers of transfusion include:  Getting an infectious disease. Developing a transfusion reaction. This is an allergic reaction to something in the blood you were given. Every precaution is taken to prevent this. The decision to have a blood transfusion has been considered carefully by your caregiver before blood is given. Blood is not given unless the benefits outweigh the risks. AFTER THE TRANSFUSION Right after receiving a blood transfusion, you will usually feel much better and more energetic. This is especially true if your red blood cells have gotten low (anemic). The transfusion raises the level of the red blood cells which carry oxygen, and this usually causes an energy increase. The nurse administering the transfusion will monitor you carefully for complications. HOME CARE INSTRUCTIONS  No special instructions are needed after a transfusion. You may find your energy is better. Speak with your caregiver about any limitations on activity for underlying diseases you may have. SEEK MEDICAL CARE IF:  Your condition is not improving after your transfusion. You develop redness or irritation at the intravenous (IV) site. SEEK IMMEDIATE MEDICAL CARE IF:  Any of the following symptoms occur over the  next 12 hours: Shaking chills. You have a temperature by mouth above 102 F (38.9 C), not controlled by medicine. Chest, back, or muscle pain. People around you feel  you are not acting correctly or are confused. Shortness of breath or difficulty breathing. Dizziness and fainting. You get a rash or develop hives. You have a decrease in urine output. Your urine turns a dark color or changes to pink, red, or brown. Any of the following symptoms occur over the next 10 days: You have a temperature by mouth above 102 F (38.9 C), not controlled by medicine. Shortness of breath. Weakness after normal activity. The white part of the eye turns yellow (jaundice). You have a decrease in the amount of urine or are urinating less often. Your urine turns a dark color or changes to pink, red, or brown. Document Released: 01/26/2000 Document Revised: 04/22/2011 Document Reviewed: 09/14/2007 [facility] Patient Information 2014 [place], [place].  _______________________________________________________________________

## 2021-07-03 NOTE — Progress Notes (Addendum)
COVID Vaccine Completed:  Yes  Date of COVID positive in last 90 days:  No  PCP - Caryl Pina, MD Cardiologist - N/A  Chest x-ray - CT chest 06-11-21 [facility] EKG - 04-05-21 Epic Stress Test - greater than 2 years ECHO - greater than 2 years Epic Cardiac Cath - N/A Pacemaker/ICD device last checked: Spinal Cord Stimulator:  Bowel Prep - N/A  Sleep Study - N/A CPAP -   Fasting Blood Sugar - N/A Checks Blood Sugar _____ times a day  Blood Thinner Instructions:  Eliquis.  Per patient he is to stop 3 days before surgery .   Aspirin Instructions: Last Dose:  Activity level:   Can go up a flight of stairs and perform activities of daily living without stopping and without symptoms of chest pain or shortness of breath.    Anesthesia review:  N/A  Patient denies shortness of breath, fever, cough and chest pain at PAT appointment  Patient verbalized understanding of instructions that were given to them at the PAT appointment. Patient was also instructed that they will need to review over the PAT instructions again at home before surgery.

## 2021-07-04 ENCOUNTER — Encounter (HOSPITAL_COMMUNITY)
Admission: RE | Admit: 2021-07-04 | Discharge: 2021-07-04 | Disposition: A | Discharge status: Home / Self Care | Payer: PPO | Source: Ambulatory Visit | Attending: Orthopedic Surgery | Admitting: Orthopedic Surgery

## 2021-07-04 ENCOUNTER — Other Ambulatory Visit: Payer: Self-pay

## 2021-07-04 ENCOUNTER — Encounter (HOSPITAL_COMMUNITY): Payer: Self-pay

## 2021-07-04 VITALS — BP 148/87 | HR 78 | Temp 98.2°F | Resp 20 | Ht 74.0 in | Wt 226.8 lb

## 2021-07-04 DIAGNOSIS — M1712 Unilateral primary osteoarthritis, left knee: Secondary | ICD-10-CM | POA: Diagnosis not present

## 2021-07-04 DIAGNOSIS — Z01818 Encounter for other preprocedural examination: Secondary | ICD-10-CM

## 2021-07-04 DIAGNOSIS — Z01812 Encounter for preprocedural laboratory examination: Secondary | ICD-10-CM | POA: Diagnosis not present

## 2021-07-04 HISTORY — DX: Anemia, unspecified: D64.9

## 2021-07-04 HISTORY — DX: Gastro-esophageal reflux disease without esophagitis: K21.9

## 2021-07-04 HISTORY — DX: Unspecified osteoarthritis, unspecified site: M19.90

## 2021-07-04 LAB — CBC
HCT: 42.9 % (ref 39.0–52.0)
Hemoglobin: 14.8 g/dL (ref 13.0–17.0)
MCH: 34.2 pg — ABNORMAL HIGH (ref 26.0–34.0)
MCHC: 34.5 g/dL (ref 30.0–36.0)
MCV: 99.1 fL (ref 80.0–100.0)
Platelets: 188 10*3/uL (ref 150–400)
RBC: 4.33 MIL/uL (ref 4.22–5.81)
RDW: 12.4 % (ref 11.5–15.5)
WBC: 5.3 10*3/uL (ref 4.0–10.5)
nRBC: 0 % (ref 0.0–0.2)

## 2021-07-04 LAB — COMPREHENSIVE METABOLIC PANEL
ALT: 42 U/L (ref 0–44)
AST: 38 U/L (ref 15–41)
Albumin: 3.9 g/dL (ref 3.5–5.0)
Alkaline Phosphatase: 47 U/L (ref 38–126)
Anion gap: 8 (ref 5–15)
BUN: 21 mg/dL (ref 8–23)
CO2: 27 mmol/L (ref 22–32)
Calcium: 9.4 mg/dL (ref 8.9–10.3)
Chloride: 104 mmol/L (ref 98–111)
Creatinine, Ser: 1.16 mg/dL (ref 0.61–1.24)
GFR, Estimated: >60 mL/min (ref >60)
Glucose, Bld: 145 mg/dL — ABNORMAL HIGH (ref 70–99)
Potassium: 3.5 mmol/L (ref 3.5–5.1)
Sodium: 139 mmol/L (ref 135–145)
Total Bilirubin: 0.8 mg/dL (ref 0.3–1.2)
Total Protein: 6.9 g/dL (ref 6.5–8.1)

## 2021-07-04 LAB — TYPE AND SCREEN
ABO/RH(D): A NEG
Antibody Screen: NEGATIVE

## 2021-07-04 LAB — SURGICAL PCR SCREEN
MRSA, PCR: NEGATIVE
Staphylococcus aureus: NEGATIVE

## 2021-07-06 DIAGNOSIS — Z9089 Acquired absence of other organs: Secondary | ICD-10-CM | POA: Diagnosis not present

## 2021-07-06 DIAGNOSIS — Z86711 Personal history of pulmonary embolism: Secondary | ICD-10-CM | POA: Diagnosis not present

## 2021-07-06 DIAGNOSIS — Z923 Personal history of irradiation: Secondary | ICD-10-CM | POA: Diagnosis not present

## 2021-07-06 DIAGNOSIS — C221 Intrahepatic bile duct carcinoma: Secondary | ICD-10-CM | POA: Diagnosis not present

## 2021-07-06 DIAGNOSIS — Z7901 Long term (current) use of anticoagulants: Secondary | ICD-10-CM | POA: Diagnosis not present

## 2021-07-16 ENCOUNTER — Ambulatory Visit (HOSPITAL_BASED_OUTPATIENT_CLINIC_OR_DEPARTMENT_OTHER): Payer: PPO | Admitting: Anesthesiology

## 2021-07-16 ENCOUNTER — Encounter (HOSPITAL_COMMUNITY)
Admission: RE | Disposition: A | Discharge status: Home / Self Care | Payer: Self-pay | Source: Ambulatory Visit | Attending: Orthopedic Surgery

## 2021-07-16 ENCOUNTER — Other Ambulatory Visit: Payer: Self-pay

## 2021-07-16 ENCOUNTER — Ambulatory Visit (HOSPITAL_COMMUNITY): Payer: PPO | Admitting: Anesthesiology

## 2021-07-16 ENCOUNTER — Observation Stay (HOSPITAL_COMMUNITY)
Admission: RE | Admit: 2021-07-16 | Discharge: 2021-07-17 | Disposition: A | Discharge status: Home / Self Care | Payer: PPO | Source: Ambulatory Visit | Attending: Orthopedic Surgery | Admitting: Orthopedic Surgery

## 2021-07-16 ENCOUNTER — Encounter (HOSPITAL_COMMUNITY): Payer: Self-pay | Admitting: Orthopedic Surgery

## 2021-07-16 DIAGNOSIS — I10 Essential (primary) hypertension: Secondary | ICD-10-CM | POA: Diagnosis not present

## 2021-07-16 DIAGNOSIS — Z7722 Contact with and (suspected) exposure to environmental tobacco smoke (acute) (chronic): Secondary | ICD-10-CM | POA: Diagnosis not present

## 2021-07-16 DIAGNOSIS — Z8505 Personal history of malignant neoplasm of liver: Secondary | ICD-10-CM | POA: Insufficient documentation

## 2021-07-16 DIAGNOSIS — G8918 Other acute postprocedural pain: Secondary | ICD-10-CM | POA: Diagnosis not present

## 2021-07-16 DIAGNOSIS — M1712 Unilateral primary osteoarthritis, left knee: Secondary | ICD-10-CM

## 2021-07-16 DIAGNOSIS — M179 Osteoarthritis of knee, unspecified: Secondary | ICD-10-CM | POA: Diagnosis present

## 2021-07-16 DIAGNOSIS — Z79899 Other long term (current) drug therapy: Secondary | ICD-10-CM | POA: Insufficient documentation

## 2021-07-16 DIAGNOSIS — Z86711 Personal history of pulmonary embolism: Secondary | ICD-10-CM | POA: Diagnosis not present

## 2021-07-16 DIAGNOSIS — Z7901 Long term (current) use of anticoagulants: Secondary | ICD-10-CM | POA: Insufficient documentation

## 2021-07-16 HISTORY — PX: TOTAL KNEE ARTHROPLASTY: SHX125

## 2021-07-16 LAB — ABO/RH: ABO/RH(D): A NEG

## 2021-07-16 SURGERY — ARTHROPLASTY, KNEE, TOTAL
Anesthesia: Spinal | Site: Knee | Laterality: Left

## 2021-07-16 MED ORDER — ONDANSETRON HCL 4 MG/2ML IJ SOLN: 4.0000 mg | Freq: Four times a day (QID) | INTRAMUSCULAR | Status: DC | PRN

## 2021-07-16 MED ORDER — CEFAZOLIN SODIUM-DEXTROSE 2-4 GM/100ML-% IV SOLN
2.0000 g | INTRAVENOUS | Status: AC
  Administered 2021-07-16: 2 g via INTRAVENOUS
  Filled 2021-07-16: qty 100

## 2021-07-16 MED ORDER — FLEET ENEMA 7-19 GM/118ML RE ENEM: 1.0000 | ENEMA | Freq: Once | RECTAL | Status: DC | PRN

## 2021-07-16 MED ORDER — ONDANSETRON HCL 4 MG/2ML IJ SOLN: 4.0000 mg | Freq: Once | INTRAMUSCULAR | Status: DC | PRN

## 2021-07-16 MED ORDER — APIXABAN 2.5 MG PO TABS
2.5000 mg | ORAL_TABLET | Freq: Two times a day (BID) | ORAL | Status: DC
  Administered 2021-07-17: 2.5 mg via ORAL
  Filled 2021-07-16: qty 1

## 2021-07-16 MED ORDER — BISACODYL 10 MG RE SUPP: 10.0000 mg | Freq: Every day | RECTAL | Status: DC | PRN

## 2021-07-16 MED ORDER — POTASSIUM CHLORIDE CRYS ER 10 MEQ PO TBCR
10.0000 meq | EXTENDED_RELEASE_TABLET | Freq: Every day | ORAL | Status: DC
  Administered 2021-07-16 – 2021-07-17 (×2): 10 meq via ORAL
  Filled 2021-07-16 (×2): qty 1

## 2021-07-16 MED ORDER — BUPIVACAINE LIPOSOME 1.3 % IJ SUSP: 20.0000 mL | Freq: Once | INTRAMUSCULAR | Status: DC

## 2021-07-16 MED ORDER — GABAPENTIN 300 MG PO CAPS
300.0000 mg | ORAL_CAPSULE | Freq: Three times a day (TID) | ORAL | Status: DC
  Administered 2021-07-16 – 2021-07-17 (×3): 300 mg via ORAL
  Filled 2021-07-16 (×3): qty 1

## 2021-07-16 MED ORDER — LACTATED RINGERS IV SOLN: INTRAVENOUS | Status: DC

## 2021-07-16 MED ORDER — BUPIVACAINE LIPOSOME 1.3 % IJ SUSP
INTRAMUSCULAR | Status: AC
  Filled 2021-07-16: qty 20

## 2021-07-16 MED ORDER — FENTANYL CITRATE PF 50 MCG/ML IJ SOSY
50.0000 ug | PREFILLED_SYRINGE | INTRAMUSCULAR | Status: DC
  Administered 2021-07-16: 50 ug via INTRAVENOUS
  Filled 2021-07-16: qty 2

## 2021-07-16 MED ORDER — DEXAMETHASONE SODIUM PHOSPHATE 10 MG/ML IJ SOLN
10.0000 mg | Freq: Once | INTRAMUSCULAR | Status: AC
  Administered 2021-07-17: 10 mg via INTRAVENOUS
  Filled 2021-07-16: qty 1

## 2021-07-16 MED ORDER — MENTHOL 3 MG MT LOZG: 1.0000 | LOZENGE | OROMUCOSAL | Status: DC | PRN

## 2021-07-16 MED ORDER — METHOCARBAMOL 500 MG IVPB - SIMPLE MED
500.0000 mg | Freq: Four times a day (QID) | INTRAVENOUS | Status: DC | PRN
  Administered 2021-07-16: 500 mg via INTRAVENOUS

## 2021-07-16 MED ORDER — POVIDONE-IODINE 10 % EX SWAB
2.0000 "application " | Freq: Once | CUTANEOUS | Status: AC
  Administered 2021-07-16: 2 via TOPICAL

## 2021-07-16 MED ORDER — HYDROCHLOROTHIAZIDE 25 MG PO TABS
25.0000 mg | ORAL_TABLET | Freq: Every day | ORAL | Status: DC
  Administered 2021-07-17: 25 mg via ORAL
  Filled 2021-07-16: qty 1

## 2021-07-16 MED ORDER — DEXAMETHASONE SODIUM PHOSPHATE 10 MG/ML IJ SOLN
8.0000 mg | Freq: Once | INTRAMUSCULAR | Status: AC
  Administered 2021-07-16: 8 mg via INTRAVENOUS

## 2021-07-16 MED ORDER — AMLODIPINE BESYLATE 10 MG PO TABS
10.0000 mg | ORAL_TABLET | Freq: Every day | ORAL | Status: DC
  Administered 2021-07-17: 10 mg via ORAL
  Filled 2021-07-16: qty 1

## 2021-07-16 MED ORDER — SODIUM CHLORIDE 0.9 % IV SOLN: INTRAVENOUS | Status: DC

## 2021-07-16 MED ORDER — METOCLOPRAMIDE HCL 5 MG PO TABS: 5.0000 mg | ORAL_TABLET | Freq: Three times a day (TID) | ORAL | Status: DC | PRN

## 2021-07-16 MED ORDER — ONDANSETRON HCL 4 MG/2ML IJ SOLN
INTRAMUSCULAR | Status: DC | PRN
  Administered 2021-07-16: 4 mg via INTRAVENOUS

## 2021-07-16 MED ORDER — CHLORHEXIDINE GLUCONATE 0.12 % MT SOLN
15.0000 mL | Freq: Once | OROMUCOSAL | Status: AC
  Administered 2021-07-16: 15 mL via OROMUCOSAL

## 2021-07-16 MED ORDER — ACETAMINOPHEN 500 MG PO TABS
1000.0000 mg | ORAL_TABLET | Freq: Four times a day (QID) | ORAL | Status: AC
  Administered 2021-07-16 – 2021-07-17 (×4): 1000 mg via ORAL
  Filled 2021-07-16 (×4): qty 2

## 2021-07-16 MED ORDER — TRANEXAMIC ACID 1000 MG/10ML IV SOLN
2000.0000 mg | Freq: Once | INTRAVENOUS | Status: AC
  Filled 2021-07-16: qty 20

## 2021-07-16 MED ORDER — ONDANSETRON HCL 4 MG/2ML IJ SOLN
INTRAMUSCULAR | Status: AC
  Filled 2021-07-16: qty 2

## 2021-07-16 MED ORDER — METOCLOPRAMIDE HCL 5 MG/ML IJ SOLN: 5.0000 mg | Freq: Three times a day (TID) | INTRAMUSCULAR | Status: DC | PRN

## 2021-07-16 MED ORDER — ACETAMINOPHEN 10 MG/ML IV SOLN: 1000.0000 mg | Freq: Once | INTRAVENOUS | Status: DC | PRN

## 2021-07-16 MED ORDER — MORPHINE SULFATE (PF) 2 MG/ML IV SOLN: 1.0000 mg | INTRAVENOUS | Status: DC | PRN

## 2021-07-16 MED ORDER — OXYCODONE HCL 5 MG PO TABS: 5.0000 mg | ORAL_TABLET | Freq: Once | ORAL | Status: DC | PRN

## 2021-07-16 MED ORDER — PROPOFOL 10 MG/ML IV BOLUS
INTRAVENOUS | Status: DC | PRN
  Administered 2021-07-16: 30 mg via INTRAVENOUS
  Administered 2021-07-16: 10 mg via INTRAVENOUS
  Administered 2021-07-16: 30 mg via INTRAVENOUS

## 2021-07-16 MED ORDER — ONDANSETRON HCL 4 MG PO TABS: 4.0000 mg | ORAL_TABLET | Freq: Four times a day (QID) | ORAL | Status: DC | PRN

## 2021-07-16 MED ORDER — SODIUM CHLORIDE (PF) 0.9 % IJ SOLN
INTRAMUSCULAR | Status: DC | PRN
  Administered 2021-07-16: 60 mL via INTRAVENOUS

## 2021-07-16 MED ORDER — TRAMADOL HCL 50 MG PO TABS: 50.0000 mg | ORAL_TABLET | Freq: Four times a day (QID) | ORAL | Status: DC | PRN

## 2021-07-16 MED ORDER — PROPOFOL 500 MG/50ML IV EMUL
INTRAVENOUS | Status: DC | PRN
  Administered 2021-07-16: 50 ug/kg/min via INTRAVENOUS

## 2021-07-16 MED ORDER — POLYETHYLENE GLYCOL 3350 17 G PO PACK: 17.0000 g | PACK | Freq: Every day | ORAL | Status: DC | PRN

## 2021-07-16 MED ORDER — ACETAMINOPHEN 10 MG/ML IV SOLN
1000.0000 mg | Freq: Four times a day (QID) | INTRAVENOUS | Status: DC
  Administered 2021-07-16: 1000 mg via INTRAVENOUS
  Filled 2021-07-16: qty 100

## 2021-07-16 MED ORDER — TRANEXAMIC ACID 1000 MG/10ML IV SOLN
INTRAVENOUS | Status: DC | PRN
  Administered 2021-07-16: 2000 mg via TOPICAL

## 2021-07-16 MED ORDER — DIPHENHYDRAMINE HCL 12.5 MG/5ML PO ELIX: 12.5000 mg | ORAL_SOLUTION | ORAL | Status: DC | PRN

## 2021-07-16 MED ORDER — OXYCODONE HCL 5 MG PO TABS
5.0000 mg | ORAL_TABLET | ORAL | Status: DC | PRN
  Administered 2021-07-16 – 2021-07-17 (×4): 10 mg via ORAL
  Filled 2021-07-16 (×4): qty 2

## 2021-07-16 MED ORDER — OXYCODONE HCL 5 MG/5ML PO SOLN: 5.0000 mg | Freq: Once | ORAL | Status: DC | PRN

## 2021-07-16 MED ORDER — MIDAZOLAM HCL 2 MG/2ML IJ SOLN
1.0000 mg | INTRAMUSCULAR | Status: DC
  Filled 2021-07-16: qty 2

## 2021-07-16 MED ORDER — SODIUM CHLORIDE 0.9 % IR SOLN
Status: DC | PRN
  Administered 2021-07-16: 1000 mL

## 2021-07-16 MED ORDER — PROPOFOL 500 MG/50ML IV EMUL
INTRAVENOUS | Status: AC
  Filled 2021-07-16: qty 50

## 2021-07-16 MED ORDER — HYDROMORPHONE HCL 1 MG/ML IJ SOLN
0.2500 mg | INTRAMUSCULAR | Status: DC | PRN
  Administered 2021-07-16 (×2): 0.5 mg via INTRAVENOUS

## 2021-07-16 MED ORDER — METHOCARBAMOL 500 MG IVPB - SIMPLE MED
INTRAVENOUS | Status: AC
  Filled 2021-07-16: qty 50

## 2021-07-16 MED ORDER — SODIUM CHLORIDE (PF) 0.9 % IJ SOLN
INTRAMUSCULAR | Status: AC
  Filled 2021-07-16: qty 10

## 2021-07-16 MED ORDER — METHOCARBAMOL 500 MG PO TABS: 500.0000 mg | ORAL_TABLET | Freq: Four times a day (QID) | ORAL | Status: DC | PRN

## 2021-07-16 MED ORDER — BUPIVACAINE LIPOSOME 1.3 % IJ SUSP
INTRAMUSCULAR | Status: DC | PRN
  Administered 2021-07-16: 20 mL

## 2021-07-16 MED ORDER — CEFAZOLIN SODIUM-DEXTROSE 2-4 GM/100ML-% IV SOLN
2.0000 g | Freq: Four times a day (QID) | INTRAVENOUS | Status: AC
  Administered 2021-07-16 (×2): 2 g via INTRAVENOUS
  Filled 2021-07-16 (×2): qty 100

## 2021-07-16 MED ORDER — ORAL CARE MOUTH RINSE: 15.0000 mL | Freq: Once | OROMUCOSAL | Status: AC

## 2021-07-16 MED ORDER — DOCUSATE SODIUM 100 MG PO CAPS
100.0000 mg | ORAL_CAPSULE | Freq: Two times a day (BID) | ORAL | Status: DC
Start: 2021-07-16 — End: 2021-07-17
  Administered 2021-07-16 – 2021-07-17 (×2): 100 mg via ORAL
  Filled 2021-07-16 (×2): qty 1

## 2021-07-16 MED ORDER — EPHEDRINE SULFATE-NACL 50-0.9 MG/10ML-% IV SOSY
PREFILLED_SYRINGE | INTRAVENOUS | Status: DC | PRN
  Administered 2021-07-16 (×3): 5 mg via INTRAVENOUS
  Administered 2021-07-16: 10 mg via INTRAVENOUS

## 2021-07-16 MED ORDER — ROPIVACAINE HCL 5 MG/ML IJ SOLN
INTRAMUSCULAR | Status: DC | PRN
  Administered 2021-07-16: 20 mL via PERINEURAL

## 2021-07-16 MED ORDER — DEXAMETHASONE SODIUM PHOSPHATE 10 MG/ML IJ SOLN
INTRAMUSCULAR | Status: AC
  Filled 2021-07-16: qty 1

## 2021-07-16 MED ORDER — PHENOL 1.4 % MT LIQD
1.0000 | OROMUCOSAL | Status: DC | PRN
Start: 2021-07-16 — End: 2021-07-17

## 2021-07-16 MED ORDER — SODIUM CHLORIDE (PF) 0.9 % IJ SOLN
INTRAMUSCULAR | Status: AC
  Filled 2021-07-16: qty 50

## 2021-07-16 MED ORDER — HYDROMORPHONE HCL 1 MG/ML IJ SOLN
INTRAMUSCULAR | Status: AC
  Filled 2021-07-16: qty 1

## 2021-07-16 MED ORDER — PANTOPRAZOLE SODIUM 40 MG PO TBEC
40.0000 mg | DELAYED_RELEASE_TABLET | Freq: Every day | ORAL | Status: DC
  Administered 2021-07-17: 40 mg via ORAL
  Filled 2021-07-16: qty 1

## 2021-07-16 MED ORDER — EPHEDRINE 5 MG/ML INJ
INTRAVENOUS | Status: AC
  Filled 2021-07-16: qty 5

## 2021-07-16 MED ORDER — BUPIVACAINE IN DEXTROSE 0.75-8.25 % IT SOLN
INTRATHECAL | Status: DC | PRN
  Administered 2021-07-16: 1.6 mL via INTRATHECAL

## 2021-07-16 SURGICAL SUPPLY — 59 items
ATTUNE MED DOME PAT 41 KNEE (Knees) ×1 IMPLANT
ATTUNE PS FEM LT SZ 7 CEM KNEE (Femur) ×1 IMPLANT
ATTUNE PSRP INSR SZ7 7 KNEE (Insert) ×1 IMPLANT
BAG COUNTER SPONGE SURGICOUNT (BAG) IMPLANT
BAG SPEC THK2 15X12 ZIP CLS (MISCELLANEOUS) ×1
BAG SPNG CNTER NS LX DISP (BAG)
BAG ZIPLOCK 12X15 (MISCELLANEOUS) ×3 IMPLANT
BASE TIBIAL ROT PLAT SZ 7 KNEE (Knees) IMPLANT
BLADE SAG 18X100X1.27 (BLADE) ×3 IMPLANT
BLADE SAW SGTL 11.0X1.19X90.0M (BLADE) ×3 IMPLANT
BNDG ELASTIC 6X5.8 VLCR STR LF (GAUZE/BANDAGES/DRESSINGS) ×3 IMPLANT
BOWL SMART MIX CTS (DISPOSABLE) ×3 IMPLANT
BSPLAT TIB 7 CMNT ROT PLAT STR (Knees) ×1 IMPLANT
CEMENT HV SMART SET (Cement) ×6 IMPLANT
CLSR STERI-STRIP ANTIMIC 1/2X4 (GAUZE/BANDAGES/DRESSINGS) ×1 IMPLANT
COVER SURGICAL LIGHT HANDLE (MISCELLANEOUS) ×3 IMPLANT
CUFF TOURN SGL QUICK 34 (TOURNIQUET CUFF) ×2
CUFF TRNQT CYL 34X4.125X (TOURNIQUET CUFF) ×2 IMPLANT
DRAPE INCISE IOBAN 66X45 STRL (DRAPES) ×3 IMPLANT
DRAPE U-SHAPE 47X51 STRL (DRAPES) ×3 IMPLANT
DRSG AQUACEL AG ADV 3.5X10 (GAUZE/BANDAGES/DRESSINGS) ×3 IMPLANT
DURAPREP 26ML APPLICATOR (WOUND CARE) ×3 IMPLANT
ELECT REM PT RETURN 15FT ADLT (MISCELLANEOUS) ×3 IMPLANT
GLOVE BIO SURGEON STRL SZ 6.5 (GLOVE) IMPLANT
GLOVE BIO SURGEON STRL SZ7.5 (GLOVE) IMPLANT
GLOVE BIO SURGEON STRL SZ8 (GLOVE) ×3 IMPLANT
GLOVE BIOGEL PI IND STRL 6.5 (GLOVE) IMPLANT
GLOVE BIOGEL PI IND STRL 7.0 (GLOVE) IMPLANT
GLOVE BIOGEL PI IND STRL 8 (GLOVE) ×2 IMPLANT
GLOVE BIOGEL PI INDICATOR 6.5 (GLOVE)
GLOVE BIOGEL PI INDICATOR 7.0 (GLOVE)
GLOVE BIOGEL PI INDICATOR 8 (GLOVE) ×1
GOWN STRL REUS W/ TWL LRG LVL3 (GOWN DISPOSABLE) ×2 IMPLANT
GOWN STRL REUS W/ TWL XL LVL3 (GOWN DISPOSABLE) IMPLANT
GOWN STRL REUS W/TWL LRG LVL3 (GOWN DISPOSABLE) ×2
GOWN STRL REUS W/TWL XL LVL3 (GOWN DISPOSABLE)
HANDPIECE INTERPULSE COAX TIP (DISPOSABLE) ×2
HOLDER FOLEY CATH W/STRAP (MISCELLANEOUS) IMPLANT
IMMOBILIZER KNEE 20 (SOFTGOODS) ×2
IMMOBILIZER KNEE 20 THIGH 36 (SOFTGOODS) ×2 IMPLANT
KIT TURNOVER KIT A (KITS) IMPLANT
MANIFOLD NEPTUNE II (INSTRUMENTS) ×3 IMPLANT
NS IRRIG 1000ML POUR BTL (IV SOLUTION) ×3 IMPLANT
PACK TOTAL KNEE CUSTOM (KITS) ×3 IMPLANT
PADDING CAST COTTON 6X4 STRL (CAST SUPPLIES) ×6 IMPLANT
PROTECTOR NERVE ULNAR (MISCELLANEOUS) ×3 IMPLANT
SET HNDPC FAN SPRY TIP SCT (DISPOSABLE) ×2 IMPLANT
SPIKE FLUID TRANSFER (MISCELLANEOUS) ×3 IMPLANT
STRIP CLOSURE SKIN 1/2X4 (GAUZE/BANDAGES/DRESSINGS) ×6 IMPLANT
SUT MNCRL AB 4-0 PS2 18 (SUTURE) ×3 IMPLANT
SUT STRATAFIX 0 PDS 27 VIOLET (SUTURE) ×2
SUT VIC AB 2-0 CT1 27 (SUTURE) ×6
SUT VIC AB 2-0 CT1 TAPERPNT 27 (SUTURE) ×6 IMPLANT
SUTURE STRATFX 0 PDS 27 VIOLET (SUTURE) ×2 IMPLANT
TIBIAL BASE ROT PLAT SZ 7 KNEE (Knees) ×2 IMPLANT
TRAY FOLEY MTR SLVR 16FR STAT (SET/KITS/TRAYS/PACK) ×3 IMPLANT
TUBE SUCTION HIGH CAP CLEAR NV (SUCTIONS) ×3 IMPLANT
WATER STERILE IRR 1000ML POUR (IV SOLUTION) ×6 IMPLANT
WRAP KNEE MAXI GEL POST OP (GAUZE/BANDAGES/DRESSINGS) ×3 IMPLANT

## 2021-07-16 NOTE — Anesthesia Procedure Notes (Signed)
Anesthesia Procedure Image    

## 2021-07-16 NOTE — Op Note (Signed)
OPERATIVE REPORT-TOTAL KNEE ARTHROPLASTY   Pre-operative diagnosis- Osteoarthritis  Left knee(s)  Post-operative diagnosis- Osteoarthritis Left knee(s)  Procedure-  Left  Total Knee Arthroplasty  Surgeon- Grant Cummings. Grant Schumpert, MD  Assistant- Grant Bream, PA-C   Anesthesia-  Regional and Spinal  EBL-50 mL   Drains None  Tourniquet time-  Total Tourniquet Time Documented: Thigh (Left) - 44 minutes Total: Thigh (Left) - 44 minutes     Complications- None  Condition-PACU - hemodynamically stable.   Brief Clinical Note   Grant Cummings. is a 76 y.o. year old male with end stage OA of his left knee with progressively worsening pain and dysfunction. He has constant pain, with activity and at rest and significant functional deficits with difficulties even with ADLs. He has had extensive non-op management including analgesics, injections of cortisone and viscosupplements, and home exercise program, but remains in significant pain with significant dysfunction. Radiographs show bone on bone arthritis medial and patellofemoral. He presents now for left Total Knee Arthroplasty.     Procedure in detail---   The patient is brought into the operating room and positioned supine on the operating table. After successful administration of  Regional and Spinal,   a tourniquet is placed high on the  Left thigh(s) and the lower extremity is prepped and draped in the usual sterile fashion. Time out is performed by the operating team and then the  Left lower extremity is wrapped in Esmarch, knee flexed and the tourniquet inflated to 300 mmHg.       A midline incision is made with a ten blade through the subcutaneous tissue to the level of the extensor mechanism. A fresh blade is used to make a medial parapatellar arthrotomy. Soft tissue over the proximal medial tibia is subperiosteally elevated to the joint line with a knife and into the semimembranosus bursa with a Cobb elevator. Soft tissue over the  proximal lateral tibia is elevated with attention being paid to avoiding the patellar tendon on the tibial tubercle. The patella is everted, knee flexed 90 degrees and the ACL and PCL are removed. Findings are bone on bone medial and patellofemoral with large global osteophytes.        The drill is used to create a starting hole in the distal femur and the canal is thoroughly irrigated with sterile saline to remove the fatty contents. The 5 degree Left  valgus alignment guide is placed into the femoral canal and the distal femoral cutting block is pinned to remove 0 mm off the distal femur. Resection is made with an oscillating saw.      The tibia is subluxed forward and the menisci are removed. The extramedullary alignment guide is placed referencing proximally at the medial aspect of the tibial tubercle and distally along the second metatarsal axis and tibial crest. The block is pinned to remove 57m off the more deficient medial  side. Resection is made with an oscillating saw. Size 7is the most appropriate size for the tibia and the proximal tibia is prepared with the modular drill and keel punch for that size.      The femoral sizing guide is placed and size 7 is most appropriate. Rotation is marked off the epicondylar axis and confirmed by creating a rectangular flexion gap at 90 degrees. The size 7 cutting block is pinned in this rotation and the anterior, posterior and chamfer cuts are made with the oscillating saw. The intercondylar block is then placed and that cut is made.  Trial size 7 tibial component, trial size 7 posterior stabilized femur and a 7  mm posterior stabilized rotating platform insert trial is placed. Full extension is achieved with excellent varus/valgus and anterior/posterior balance throughout full range of motion. The patella is everted and thickness measured to be 27  mm. Free hand resection is taken to 15 mm, a 41 template is placed, lug holes are drilled, trial patella is  placed, and it tracks normally. Osteophytes are removed off the posterior femur with the trial in place. All trials are removed and the cut bone surfaces prepared with pulsatile lavage. Cement is mixed and once ready for implantation, the size 7 tibial implant, size  7 posterior stabilized femoral component, and the size 41 patella are cemented in place and the patella is held with the clamp. The trial insert is placed and the knee held in full extension. The Exparel (20 ml mixed with 60 ml saline) is injected into the extensor mechanism, posterior capsule, medial and lateral gutters and subcutaneous tissues.  All extruded cement is removed and once the cement is hard the permanent 7 mm posterior stabilized rotating platform insert is placed into the tibial tray.      The wound is copiously irrigated with saline solution and the extensor mechanism closed with # 0 Stratofix suture. The tourniquet is released for a total tourniquet time of 43  minutes. Flexion against gravity is 140 degrees and the patella tracks normally. Subcutaneous tissue is closed with 2.0 vicryl and subcuticular with running 4.0 Monocryl. The incision is cleaned and dried and steri-strips and a bulky sterile dressing are applied. The limb is placed into a knee immobilizer and the patient is awakened and transported to recovery in stable condition.      Please note that a surgical assistant was a medical necessity for this procedure in order to perform it in a safe and expeditious manner. Surgical assistant was necessary to retract the ligaments and vital neurovascular structures to prevent injury to them and also necessary for proper positioning of the limb to allow for anatomic placement of the prosthesis.   Grant Cummings Grant Gouveia, MD    07/16/2021, 12:17 PM

## 2021-07-16 NOTE — Progress Notes (Signed)
Orthopedic Tech Progress Note Patient Details:  Grant Cummings [birth year] [id number]  CPM Left Knee CPM Left Knee: On Left Knee Flexion (Degrees): 40 Left Knee Extension (Degrees): 10  Post Interventions Patient Tolerated: Well Instructions Provided: Care of device, Adjustment of device  [name] 07/16/2021, 12:29 PM

## 2021-07-16 NOTE — Anesthesia Postprocedure Evaluation (Signed)
Anesthesia Post Note  Patient: Grant Cummings.  Procedure(s) Performed: TOTAL KNEE ARTHROPLASTY (Left: Knee)     Patient location during evaluation: PACU Anesthesia Type: Spinal Level of consciousness: oriented and awake and alert Pain management: pain level controlled Vital Signs Assessment: post-procedure vital signs reviewed and stable Respiratory status: spontaneous breathing, respiratory function stable and patient connected to nasal cannula oxygen Cardiovascular status: blood pressure returned to baseline and stable Postop Assessment: no headache, no backache and no apparent nausea or vomiting Anesthetic complications: no   No notable events documented.  Last Vitals:  Vitals:   07/16/21 1400 07/16/21 1410  BP: (!) 141/85   Pulse: 73 76  Resp: 12 10  Temp:    SpO2: 93% 92%    Last Pain:  Vitals:   07/16/21 1410  TempSrc:   PainSc: 0-No pain    LLE Motor Response: Purposeful movement (07/16/21 1410) LLE Sensation: Decreased;Increased (07/16/21 1410) RLE Motor Response: Purposeful movement (07/16/21 1410) RLE Sensation: Increased (07/16/21 1410) L Sensory Level: S1-Sole of foot, small toes (07/16/21 1410) R Sensory Level: S1-Sole of foot, small toes (07/16/21 1410)  Derrel Moore S

## 2021-07-16 NOTE — Interval H&P Note (Signed)
History and Physical Interval Note:  07/16/2021 8:29 AM  Grant Cummings.  has presented today for surgery, with the diagnosis of Left knee osteoarthritis.  The various methods of treatment have been discussed with the patient and family. After consideration of risks, benefits and other options for treatment, the patient has consented to  Procedure(s): TOTAL KNEE ARTHROPLASTY (Left) as a surgical intervention.  The patient's history has been reviewed, patient examined, no change in status, stable for surgery.  I have reviewed the patient's chart and labs.  Questions were answered to the patient's satisfaction.     Pilar Plate Sebastin Perlmutter

## 2021-07-16 NOTE — Transfer of Care (Signed)
Immediate Anesthesia Transfer of Care Note  Patient: Jalon Squier.  Procedure(s) Performed: TOTAL KNEE ARTHROPLASTY (Left: Knee)  Patient Location: PACU  Anesthesia Type:Spinal and MAC combined with regional for post-op pain  Level of Consciousness: awake, alert  and patient cooperative  Airway & Oxygen Therapy: Patient Spontanous Breathing and Patient connected to face mask oxygen  Post-op Assessment: Report given to RN, Post -op Vital signs reviewed and stable and BP 131/78   Pulse 78   Resp 12  POx 100%  Post vital signs: Reviewed and stable  Last Vitals:  Vitals Value Taken Time  BP    Temp    Pulse    Resp    SpO2      Last Pain:  Vitals:   07/16/21 0952  TempSrc:   PainSc: 0-No pain      Patients Stated Pain Goal: 4 (77/03/40 3524)  Complications: No notable events documented.

## 2021-07-16 NOTE — Discharge Instructions (Signed)
 Frank Aluisio, MD Total Joint Specialist [facility] [address] [place],  [address] [phone]  TOTAL KNEE REPLACEMENT POSTOPERATIVE DIRECTIONS    Knee Rehabilitation, Guidelines Following Surgery  Results after knee surgery are often greatly improved when you follow the exercise, range of motion and muscle strengthening exercises prescribed by your doctor. Safety measures are also important to protect the knee from further injury. If any of these exercises cause you to have increased pain or swelling in your knee joint, decrease the amount until you are comfortable again and slowly increase them. If you have problems or questions, call your caregiver or physical therapist for advice.   HOME CARE INSTRUCTIONS  Remove items at home which could result in a fall. This includes throw rugs or furniture in walking pathways.  ICE to the affected knee as much as tolerated. Icing helps control swelling. If the swelling is well controlled you will be more comfortable and rehab easier. Continue to use ice on the knee for pain and swelling from surgery. You may notice swelling that will progress down to the foot and ankle. This is normal after surgery. Elevate the leg when you are not up walking on it.    Continue to use the breathing machine which will help keep your temperature down. It is common for your temperature to cycle up and down following surgery, especially at night when you are not up moving around and exerting yourself. The breathing machine keeps your lungs expanded and your temperature down. Do not place pillow under the operative knee, focus on keeping the knee straight while resting  DIET You may resume your previous home diet once you are discharged from the hospital.  DRESSING / WOUND CARE / SHOWERING Keep your bulky bandage on for 2 days. On the third post-operative day you may remove the Ace bandage and gauze. There is a waterproof  adhesive bandage on your skin which will stay in place until your first follow-up appointment. Once you remove this you will not need to place another bandage You may begin showering 3 days following surgery, but do not submerge the incision under water.  ACTIVITY For the first 5 days, the key is rest and control of pain and swelling Do your home exercises twice a day starting on post-operative day 3. On the days you go to physical therapy, just do the home exercises once that day. You should rest, ice and elevate the leg for 50 minutes out of every hour. Get up and walk/stretch for 10 minutes per hour. After 5 days you can increase your activity slowly as tolerated. Walk with your walker as instructed. Use the walker until you are comfortable transitioning to a cane. Walk with the cane in the opposite hand of the operative leg. You may discontinue the cane once you are comfortable and walking steadily. Avoid periods of inactivity such as sitting longer than an hour when not asleep. This helps prevent blood clots.  You may discontinue the knee immobilizer once you are able to perform a straight leg raise while lying down. You may resume a sexual relationship in one month or when given the OK by your doctor.  You may return to work once you are cleared by your doctor.  Do not drive a car for 6 weeks or until released by your surgeon.  Do not drive while taking narcotics.  TED HOSE STOCKINGS Wear the elastic stockings on both legs for three weeks following surgery during the   day. You may remove them at night for sleeping.  WEIGHT BEARING Weight bearing as tolerated with assist device (walker, cane, etc) as directed, use it as long as suggested by your surgeon or therapist, typically at least 4-6 weeks.  POSTOPERATIVE CONSTIPATION PROTOCOL Constipation - defined medically as fewer than three stools per week and severe constipation as less than one stool per week.  One of the most common issues  patients have following surgery is constipation.  Even if you have a regular bowel pattern at home, your normal regimen is likely to be disrupted due to multiple reasons following surgery.  Combination of anesthesia, postoperative narcotics, change in appetite and fluid intake all can affect your bowels.  In order to avoid complications following surgery, here are some recommendations in order to help you during your recovery period.  Colace (docusate) - Pick up an over-the-counter form of Colace or another stool softener and take twice a day as long as you are requiring postoperative pain medications.  Take with a full glass of water daily.  If you experience loose stools or diarrhea, hold the colace until you stool forms back up. If your symptoms do not get better within 1 week or if they get worse, check with your doctor. Dulcolax (bisacodyl) - Pick up over-the-counter and take as directed by the product packaging as needed to assist with the movement of your bowels.  Take with a full glass of water.  Use this product as needed if not relieved by Colace only.  MiraLax (polyethylene glycol) - Pick up over-the-counter to have on hand. MiraLax is a solution that will increase the amount of water in your bowels to assist with bowel movements.  Take as directed and can mix with a glass of water, juice, soda, coffee, or tea. Take if you go more than two days without a movement. Do not use MiraLax more than once per day. Call your doctor if you are still constipated or irregular after using this medication for 7 days in a row.  If you continue to have problems with postoperative constipation, please contact the office for further assistance and recommendations.  If you experience "the worst abdominal pain ever" or develop nausea or vomiting, please contact the office immediatly for further recommendations for treatment.  ITCHING If you experience itching with your medications, try taking only a single pain  pill, or even half a pain pill at a time.  You can also use Benadryl over the counter for itching or also to help with sleep.   MEDICATIONS See your medication summary on the "After Visit Summary" that the nursing staff will review with you prior to discharge.  You may have some home medications which will be placed on hold until you complete the course of blood thinner medication.  It is important for you to complete the blood thinner medication as prescribed by your surgeon.  Continue your approved medications as instructed at time of discharge.  PRECAUTIONS If you experience chest pain or shortness of breath - call 911 immediately for transfer to the hospital emergency department.  If you develop a fever greater that 101 F, purulent drainage from wound, increased redness or drainage from wound, foul odor from the wound/dressing, or calf pain - CONTACT YOUR SURGEON.                                                     FOLLOW-UP APPOINTMENTS Make sure you keep all of your appointments after your operation with your surgeon and caregivers. You should call the office at the above phone number and make an appointment for approximately two weeks after the date of your surgery or on the date instructed by your surgeon outlined in the "After Visit Summary".  RANGE OF MOTION AND STRENGTHENING EXERCISES  Rehabilitation of the knee is important following a knee injury or an operation. After just a few days of immobilization, the muscles of the thigh which control the knee become weakened and shrink (atrophy). Knee exercises are designed to build up the tone and strength of the thigh muscles and to improve knee motion. Often times heat used for twenty to thirty minutes before working out will loosen up your tissues and help with improving the range of motion but do not use heat for the first two weeks following surgery. These exercises can be done on a training (exercise) mat, on the floor, on a table or on a bed.  Use what ever works the best and is most comfortable for you Knee exercises include:  Leg Lifts - While your knee is still immobilized in a splint or cast, you can do straight leg raises. Lift the leg to 60 degrees, hold for 3 sec, and slowly lower the leg. Repeat 10-20 times 2-3 times daily. Perform this exercise against resistance later as your knee gets better.  Quad and Hamstring Sets - Tighten up the muscle on the front of the thigh (Quad) and hold for 5-10 sec. Repeat this 10-20 times hourly. Hamstring sets are done by pushing the foot backward against an object and holding for 5-10 sec. Repeat as with quad sets.  Leg Slides: Lying on your back, slowly slide your foot toward your buttocks, bending your knee up off the floor (only go as far as is comfortable). Then slowly slide your foot back down until your leg is flat on the floor again. Angel Wings: Lying on your back spread your legs to the side as far apart as you can without causing discomfort.  A rehabilitation program following serious knee injuries can speed recovery and prevent re-injury in the future due to weakened muscles. Contact your doctor or a physical therapist for more information on knee rehabilitation.   POST-OPERATIVE OPIOID TAPER INSTRUCTIONS: It is important to wean off of your opioid medication as soon as possible. If you do not need pain medication after your surgery it is ok to stop day one. Opioids include: Codeine, Hydrocodone(Norco, Vicodin), Oxycodone(Percocet, oxycontin) and hydromorphone amongst others.  Long term and even short term use of opiods can cause: Increased pain response Dependence Constipation Depression Respiratory depression And more.  Withdrawal symptoms can include Flu like symptoms Nausea, vomiting And more Techniques to manage these symptoms Hydrate well Eat regular healthy meals Stay active Use relaxation techniques(deep breathing, meditating, yoga) Do Not substitute Alcohol to help  with tapering If you have been on opioids for less than two weeks and do not have pain than it is ok to stop all together.  Plan to wean off of opioids This plan should start within one week post op of your joint replacement. Maintain the same interval or time between taking each dose and first decrease the dose.  Cut the total daily intake of opioids by one tablet each day Next start to increase the time between doses. The last dose that should be eliminated is the evening dose.   IF YOU ARE TRANSFERRED TO   A SKILLED REHAB FACILITY If the patient is transferred to a skilled rehab facility following release from the hospital, a list of the current medications will be sent to the facility for the patient to continue.  When discharged from the skilled rehab facility, please have the facility set up the patient's Home Health Physical Therapy prior to being released. Also, the skilled facility will be responsible for providing the patient with their medications at time of release from the facility to include their pain medication, the muscle relaxants, and their blood thinner medication. If the patient is still at the rehab facility at time of the two week follow up appointment, the skilled rehab facility will also need to assist the patient in arranging follow up appointment in our office and any transportation needs.  MAKE SURE YOU:  Understand these instructions.  Get help right away if you are not doing well or get worse.   DENTAL ANTIBIOTICS:  In most cases prophylactic antibiotics for Dental procdeures after total joint surgery are not necessary.  Exceptions are as follows:  1. History of prior total joint infection  2. Severely immunocompromised (Organ Transplant, cancer chemotherapy, Rheumatoid biologic medications such as Humera)  3. Poorly controlled diabetes (A1C &gt; 8.0, blood glucose over 200)  If you have one of these conditions, contact your surgeon for an antibiotic  prescription, prior to your dental procedure.    Pick up stool softner and laxative for home use following surgery while on pain medications. Do not submerge incision under water. Please use good hand washing techniques while changing dressing each day. May shower starting three days after surgery. Please use a clean towel to pat the incision dry following showers. Continue to use ice for pain and swelling after surgery. Do not use any lotions or creams on the incision until instructed by your surgeon.  

## 2021-07-16 NOTE — Anesthesia Procedure Notes (Signed)
Procedure Name: MAC Date/Time: 07/16/2021 10:56 AM Performed by: Lollie Sails, CRNA Pre-anesthesia Checklist: Patient identified, Emergency Drugs available, Suction available, Patient being monitored and Timeout performed Oxygen Delivery Method: Simple face mask Placement Confirmation: positive ETCO2

## 2021-07-16 NOTE — Evaluation (Addendum)
Physical Therapy Evaluation Patient Details Name: Grant Cummings. MRN: [id number] DOB: 06-Jun-1945 Today's Date: 07/16/2021  History of Present Illness  Pt is a 76yo male presenting s/p L-TKA on 07/16/21. PMH: Oa, GERD, HLD, HTN, hx of liver cancer, Hx of PE (saddle).  Clinical Impression  Grant Cummings. is a 76 y.o. male POD 0 s/p L-TKA. Patient reports independence with mobility at baseline. Patient is now limited by functional impairments (see PT problem list below) and requires min guard for transfers and gait with RW. Patient was able to ambulate 20 feet with RW and min assist. Patient instructed in exercise to facilitate ROM and circulation to manage edema as well as incentive spirometry. Patient will benefit from continued skilled PT interventions to address impairments and progress towards PLOF. Acute PT will follow to progress mobility and stair training in preparation for safe discharge home.       Recommendations for follow up therapy are one component of a multi-disciplinary discharge planning process, led by the attending physician.  Recommendations may be updated based on patient status, additional functional criteria and insurance authorization.  Follow Up Recommendations Follow physician's recommendations for discharge plan and follow up therapies    Assistance Recommended at Discharge Intermittent Supervision/Assistance  Patient can return home with the following  A little help with walking and/or transfers;A little help with bathing/dressing/bathroom;Assistance with cooking/housework;Assist for transportation;Help with stairs or ramp for entrance    Equipment Recommendations None recommended by PT (pt has recommended DME)  Recommendations for Other Services       Functional Status Assessment Patient has had a recent decline in their functional status and demonstrates the ability to make significant improvements in function in a reasonable and predictable amount of time.      Precautions / Restrictions Precautions Precautions: Fall Required Braces or Orthoses: Knee Immobilizer - Left Knee Immobilizer - Left: On when out of bed or walking Restrictions Weight Bearing Restrictions: Yes LLE Weight Bearing: Weight bearing as tolerated      Mobility  Bed Mobility Overal bed mobility: Needs Assistance Bed Mobility: Supine to Sit     Supine to sit: HOB elevated, Min assist     General bed mobility comments: Pt min assist for trunk assist, otherwise min guard.    Transfers Overall transfer level: Needs assistance Equipment used: Rolling walker (2 wheels) Transfers: Sit to/from Stand Sit to Stand: From elevated surface, Min guard           General transfer comment: Pt min guard for transfer from elevated surface, no physical assist required, VCs for sequencing.    Ambulation/Gait Ambulation/Gait assistance: Min guard, +2 safety/equipment Gait Distance (Feet): 20 Feet Assistive device: Rolling walker (2 wheels) Gait Pattern/deviations: Step-to pattern Gait velocity: decreased     General Gait Details: Pt ambulated with RW and min guard assist +2 for recliner follow, no physical assist required or overt LOB noted.  Stairs            Wheelchair Mobility    Modified Rankin (Stroke Patients Only)       Balance Overall balance assessment: Needs assistance Sitting-balance support: Feet supported, No upper extremity supported Sitting balance-Leahy Scale: Fair     Standing balance support: Reliant on assistive device for balance, During functional activity, Bilateral upper extremity supported Standing balance-Leahy Scale: Poor  Pertinent Vitals/Pain Pain Assessment Pain Assessment: 0-10 Pain Score: 5  Pain Location: LEFT KNEE Pain Descriptors / Indicators: Operative site guarding Pain Intervention(s): Limited activity within patient's tolerance, Monitored during session, Repositioned,  Ice applied    Home Living Family/patient expects to be discharged to:: Private residence Living Arrangements: Spouse/significant other Available Help at Discharge: Family;Available 24 hours/day Type of Home: House Home Access: Stairs to enter   [employer] of Steps: 1   Home Layout: One level Home Equipment: Conservation officer, nature (2 wheels);Cane - single point;Shower seat;BSC/3in1 Additional Comments: Wife Grant Cummings assisted with home environment    Prior Function Prior Level of Function : Independent/Modified Independent             Mobility Comments: ind ADLs Comments: ind     Hand Dominance        Extremity/Trunk Assessment   Upper Extremity Assessment Upper Extremity Assessment: Overall WFL for tasks assessed    Lower Extremity Assessment Lower Extremity Assessment: RLE deficits/detail;LLE deficits/detail RLE Deficits / Details: MMT ank PF/DF 5/5 RLE Sensation: WNL LLE Deficits / Details: MMT ank PF/DF 5/5, no extensor lag ntoed LLE Sensation: WNL    Cervical / Trunk Assessment Cervical / Trunk Assessment: Normal  Communication   Communication: HOH (Reads lips)  Cognition Arousal/Alertness: Awake/alert Behavior During Therapy: WFL for tasks assessed/performed Overall Cognitive Status: Within Functional Limits for tasks assessed                                          General Comments General comments (skin integrity, edema, etc.): Wife Grant Cummings present for the duration of session. She expressed some concern about early mobility and OPPT, educated pt on the research of pain control + early mobility and importance to start quickly, with appropriate pacing. Pt verbalized understanding.    Exercises Total Joint Exercises Ankle Circles/Pumps: AROM, 10 reps, Both Other Exercises Other Exercises: Incentive spirometry x5   Assessment/Plan    PT Assessment Patient needs continued PT services  PT Problem List Decreased strength;Decreased  range of motion;Decreased activity tolerance;Decreased balance;Decreased mobility;Decreased coordination;Decreased knowledge of use of DME;Pain       PT Treatment Interventions DME instruction;Gait training;Stair training;Functional mobility training;Therapeutic activities;Therapeutic exercise;Balance training;Neuromuscular re-education;Patient/family education    PT Goals (Current goals can be found in the Care Plan section)  Acute Rehab PT Goals Patient Stated Goal: walking more with les pain PT Goal Formulation: With patient Time For Goal Achievement: 07/23/21 Potential to Achieve Goals: Good    Frequency 7X/week     Co-evaluation               AM-PAC PT "6 Clicks" Mobility  Outcome Measure Help needed turning from your back to your side while in a flat bed without using bedrails?: None Help needed moving from lying on your back to sitting on the side of a flat bed without using bedrails?: None Help needed moving to and from a bed to a chair (including a wheelchair)?: A Little Help needed standing up from a chair using your arms (e.g., wheelchair or bedside chair)?: A Little Help needed to walk in hospital room?: A Little Help needed climbing 3-5 steps with a railing? : A Little 6 Click Score: 20    End of Session Equipment Utilized During Treatment: Gait belt Activity Tolerance: Patient tolerated treatment well;No increased pain Patient left: in chair;with call bell/phone within reach;with chair alarm set;with family/visitor  present Nurse Communication: Mobility status PT Visit Diagnosis: Difficulty in walking, not elsewhere classified (R26.2);Pain Pain - Right/Left: Left Pain - part of body: Knee    Time: 5670-1410 PT Time Calculation (min) (ACUTE ONLY): 29 min   Charges:   PT Evaluation $PT Eval Low Complexity: 1 Low PT Treatments $Gait Training: 8-22 mins        Coolidge Breeze, PT, DPT [facility] Office: [phone] Pager:  [phone]  Coolidge Breeze 07/16/2021, 5:35 PM

## 2021-07-16 NOTE — Care Plan (Signed)
Ortho Bundle Case Management Note  Patient Details  Name: Tarquin Welcher. MRN: [id number] Date of Birth: [birth year]  L TKA on 07-16-21 DCP:  Home with wife DME:  No needs, borrowing a RW PT:  [facility] on 07-19-21                   DME Arranged:  N/A DME Agency:  NA  HH Arranged:  NA HH Agency:  NA  Additional Comments: Please contact me with any questions of if this plan should need to change.  Marianne Sofia, RN,CCM [facility]  [phone] 07/16/2021, 11:16 AM

## 2021-07-16 NOTE — Anesthesia Procedure Notes (Signed)
Spinal  Patient location during procedure: OR Start time: 07/16/2021 10:54 AM End time: 07/16/2021 10:58 AM Reason for block: surgical anesthesia Staffing Performed: anesthesiologist  Anesthesiologist: Myrtie Soman, MD Preanesthetic Checklist Completed: patient identified, IV checked, site marked, risks and benefits discussed, surgical consent, monitors and equipment checked, pre-op evaluation and timeout performed Spinal Block Patient position: sitting Prep: Betadine Patient monitoring: heart rate, continuous pulse ox and blood pressure Approach: midline Location: L4-5 Injection technique: single-shot Needle Needle type: Sprotte  Needle gauge: 24 G Needle length: 9 cm Assessment Sensory level: T6 Events: CSF return Additional Notes

## 2021-07-16 NOTE — Progress Notes (Signed)
Assisted Dr. Rose with left, adductor canal, ultrasound guided Lashena Signer. Side rails up, monitors on throughout procedure. See vital signs in flow sheet. Tolerated Procedure well. ? ?

## 2021-07-16 NOTE — Anesthesia Procedure Notes (Signed)
Anesthesia Regional Block: Adductor canal block   Pre-Anesthetic Checklist: , timeout performed,  Correct Patient, Correct Site, Correct Laterality,  Correct Procedure, Correct Position, site marked,  Risks and benefits discussed,  Surgical consent,  Pre-op evaluation,  At surgeon's request and post-op pain management  Laterality: Left  Prep: chloraprep       Needles:  Injection technique: Single-shot  Needle Type: Echogenic Needle     Needle Length: 9cm      Additional Needles:   Procedures:,,,, ultrasound used (permanent image in chart),,    Narrative:  Start time: 07/16/2021 9:46 AM End time: 07/16/2021 9:53 AM Injection made incrementally with aspirations every 5 mL.  Performed by: Personally  Anesthesiologist: Myrtie Soman, MD  Additional Notes: Patient tolerated the procedure well without complications

## 2021-07-16 NOTE — Anesthesia Preprocedure Evaluation (Addendum)
Anesthesia Evaluation  Patient identified by MRN, date of birth, ID band Patient awake    Reviewed: Allergy & Precautions, NPO status , Patient's Chart, lab work & pertinent test results  Airway Mallampati: II  TM Distance: >3 FB Neck ROM: Full    Dental no notable dental hx.    Pulmonary PE   Pulmonary exam normal breath sounds clear to auscultation       Cardiovascular hypertension, Pt. on medications Normal cardiovascular exam Rhythm:Regular Rate:Normal     Neuro/Psych negative neurological ROS  negative psych ROS   GI/Hepatic Neg liver ROS, GERD  ,  Endo/Other  negative endocrine ROS  Renal/GU negative Renal ROS  negative genitourinary   Musculoskeletal  (+) Arthritis , Osteoarthritis,    Abdominal   Peds negative pediatric ROS (+)  Hematology negative hematology ROS (+) On eliquis for PE   Anesthesia Other Findings   Reproductive/Obstetrics negative OB ROS                            Anesthesia Physical Anesthesia Plan  ASA: 2  Anesthesia Plan: Spinal   Post-op Pain Management: Regional block*   Induction: Intravenous  PONV Risk Score and Plan: 1 and Propofol infusion and Treatment may vary due to age or medical condition  Airway Management Planned: Simple Face Mask  Additional Equipment:   Intra-op Plan:   Post-operative Plan:   Informed Consent: I have reviewed the patients History and Physical, chart, labs and discussed the procedure including the risks, benefits and alternatives for the proposed anesthesia with the patient or authorized representative who has indicated his/her understanding and acceptance.     Dental advisory given  Plan Discussed with: CRNA and Surgeon  Anesthesia Plan Comments:         Anesthesia Quick Evaluation

## 2021-07-17 ENCOUNTER — Encounter (HOSPITAL_COMMUNITY): Payer: Self-pay | Admitting: Orthopedic Surgery

## 2021-07-17 DIAGNOSIS — M1712 Unilateral primary osteoarthritis, left knee: Secondary | ICD-10-CM | POA: Diagnosis not present

## 2021-07-17 LAB — CBC
HCT: 40.1 % (ref 39.0–52.0)
Hemoglobin: 13.8 g/dL (ref 13.0–17.0)
MCH: 34.6 pg — ABNORMAL HIGH (ref 26.0–34.0)
MCHC: 34.4 g/dL (ref 30.0–36.0)
MCV: 100.5 fL — ABNORMAL HIGH (ref 80.0–100.0)
Platelets: 148 10*3/uL — ABNORMAL LOW (ref 150–400)
RBC: 3.99 MIL/uL — ABNORMAL LOW (ref 4.22–5.81)
RDW: 12.3 % (ref 11.5–15.5)
WBC: 13.9 10*3/uL — ABNORMAL HIGH (ref 4.0–10.5)
nRBC: 0 % (ref 0.0–0.2)

## 2021-07-17 LAB — BASIC METABOLIC PANEL
Anion gap: 8 (ref 5–15)
BUN: 18 mg/dL (ref 8–23)
CO2: 24 mmol/L (ref 22–32)
Calcium: 8.4 mg/dL — ABNORMAL LOW (ref 8.9–10.3)
Chloride: 104 mmol/L (ref 98–111)
Creatinine, Ser: 1 mg/dL (ref 0.61–1.24)
GFR, Estimated: >60 mL/min (ref >60)
Glucose, Bld: 146 mg/dL — ABNORMAL HIGH (ref 70–99)
Potassium: 4 mmol/L (ref 3.5–5.1)
Sodium: 136 mmol/L (ref 135–145)

## 2021-07-17 MED ORDER — METHOCARBAMOL 500 MG PO TABS: 500.0000 mg | ORAL_TABLET | Freq: Four times a day (QID) | ORAL | 0 refills | Status: DC | PRN

## 2021-07-17 MED ORDER — GABAPENTIN 300 MG PO CAPS: ORAL_CAPSULE | ORAL | 0 refills | Status: DC

## 2021-07-17 MED ORDER — OXYCODONE HCL 10 MG PO TABS: 5.0000 mg | ORAL_TABLET | Freq: Four times a day (QID) | ORAL | 0 refills | Status: DC | PRN

## 2021-07-17 MED ORDER — OXYCODONE-ACETAMINOPHEN 5-325 MG PO TABS: 1.0000 | ORAL_TABLET | Freq: Four times a day (QID) | ORAL | 0 refills | Status: DC | PRN

## 2021-07-17 MED ORDER — OXYCODONE HCL 5 MG PO TABS: 5.0000 mg | ORAL_TABLET | Freq: Four times a day (QID) | ORAL | 0 refills | Status: DC | PRN

## 2021-07-17 MED ORDER — TRAMADOL HCL 50 MG PO TABS: 50.0000 mg | ORAL_TABLET | Freq: Four times a day (QID) | ORAL | 0 refills | Status: DC | PRN

## 2021-07-17 NOTE — Progress Notes (Signed)
Physical Therapy Treatment Patient Details Name: Grant Cummings. MRN: [id number] DOB: 08-07-1945 Today's Date: 07/17/2021   History of Present Illness Pt is a 76yo male presenting s/p L-TKA on 07/16/21. PMH: Oa, GERD, HLD, HTN, hx of liver cancer, Hx of PE (saddle).    PT Comments    POD # 1 am session Assisted OOB.  General bed mobility comments: demonstarted and instructed on tech using gait belt.  Instructed Spouse proper tech donning KI and instructed on use for amb and stairs for increased support.  Assisted with amb in hallway.  General Gait Details: 25% VC's on proper sequencing, proper walker to self distance and safety with turns. Spouse present for "hands on" assist.  Then returned to room to perform some TE's following HEP handout.  Instructed on proper tech, freq as well as use of ICE.   Pt will need another PT session to address stairs and complete Education on HEP.   Recommendations for follow up therapy are one component of a multi-disciplinary discharge planning process, led by the attending physician.  Recommendations may be updated based on patient status, additional functional criteria and insurance authorization.  Follow Up Recommendations  Follow physician's recommendations for discharge plan and follow up therapies     Assistance Recommended at Discharge Intermittent Supervision/Assistance  Patient can return home with the following A little help with walking and/or transfers;A little help with bathing/dressing/bathroom;Assistance with cooking/housework;Assist for transportation;Help with stairs or ramp for entrance   Equipment Recommendations  None recommended by PT    Recommendations for Other Services       Precautions / Restrictions Precautions Precautions: Fall Precaution Comments: instructed no pillow under knee Required Braces or Orthoses: Knee Immobilizer - Left Knee Immobilizer - Left: On when out of bed or walking Restrictions Weight Bearing  Restrictions: No LLE Weight Bearing: Weight bearing as tolerated Other Position/Activity Restrictions: elevation/extension     Mobility  Bed Mobility Overal bed mobility: Needs Assistance Bed Mobility: Supine to Sit     Supine to sit: Supervision, Min guard     General bed mobility comments: demonstarted and instructed on tech using gait belt    Transfers Overall transfer level: Needs assistance Equipment used: Rolling walker (2 wheels) Transfers: Sit to/from Stand Sit to Stand: From elevated surface, Min guard           General transfer comment: 25% VC's on proper hand placement and safety with turns.    Ambulation/Gait Ambulation/Gait assistance: Supervision, Min guard Gait Distance (Feet): 55 Feet Assistive device: Rolling walker (2 wheels) Gait Pattern/deviations: Step-to pattern, Decreased stance time - left Gait velocity: decreased     General Gait Details: 25% VC's on proper sequencing, proper walker to self distance and safety with turns.   Stairs             Wheelchair Mobility    Modified Rankin (Stroke Patients Only)       Balance                                            Cognition Arousal/Alertness: Awake/alert Behavior During Therapy: WFL for tasks assessed/performed Overall Cognitive Status: Within Functional Limits for tasks assessed                                 General Comments: AxO x  3 vey pleasant        Exercises  Total Knee Replacement TE's following HEP handout 10 reps B LE ankle pumps 05 reps towel squeezes 05 reps knee presses 05 reps heel slides  05 reps SAQ's 05 reps SLR's 05 reps ABD Educated on use of gait belt to assist with TE's Followed by ICE     General Comments        Pertinent Vitals/Pain Pain Assessment Pain Assessment: 0-10 Pain Score: 5  Pain Location: LEFT KNEE Pain Descriptors / Indicators: Operative site guarding Pain Intervention(s): Monitored  during session, Repositioned, Ice applied, Premedicated before session    Home Living                          Prior Function            PT Goals (current goals can now be found in the care plan section) Progress towards PT goals: Progressing toward goals    Frequency    7X/week      PT Plan Current plan remains appropriate    Co-evaluation              AM-PAC PT "6 Clicks" Mobility   Outcome Measure  Help needed turning from your back to your side while in a flat bed without using bedrails?: A Little Help needed moving from lying on your back to sitting on the side of a flat bed without using bedrails?: A Little Help needed moving to and from a bed to a chair (including a wheelchair)?: A Little Help needed standing up from a chair using your arms (e.g., wheelchair or bedside chair)?: A Little Help needed to walk in hospital room?: A Little Help needed climbing 3-5 steps with a railing? : A Little 6 Click Score: 18    End of Session Equipment Utilized During Treatment: Gait belt Activity Tolerance: Patient tolerated treatment well;No increased pain Patient left: in chair;with call bell/phone within reach;with chair alarm set;with family/visitor present Nurse Communication: Mobility status PT Visit Diagnosis: Difficulty in walking, not elsewhere classified (R26.2);Pain Pain - Right/Left: Left Pain - part of body: Knee     Time: 2330-0762 PT Time Calculation (min) (ACUTE ONLY): 45 min  Charges:  $Gait Training: 8-22 mins $Therapeutic Exercise: 8-22 mins $Therapeutic Activity: 8-22 mins                     Rica Koyanagi  PTA Acute  Rehabilitation Services Pager      [phone] Office      [phone]

## 2021-07-17 NOTE — Progress Notes (Signed)
Pt Physical Therapy Treatment Patient Details Name: Grant Cummings. MRN: [id number] DOB: 03-16-1945 Today's Date: 07/17/2021   History of Present Illness Pt is a 76yo male presenting s/p L-TKA on 07/16/21. PMH: Oa, GERD, HLD, HTN, hx of liver cancer, Hx of PE (saddle).    PT Comments    POD # 1 pm session Had spouse apply KI with instruction on proper tech.   Assisted with amb a greater distance in hallway.  Practiced one step. General stair comments: with Spouse "hands on" assisted up/down one step with 50% VC's on proper walker placement as well as sequencing.  Performed twice. Spouse stated she will have extra assist at home to get pt into house. Then returned to room to perform some TE's following HEP handout.  Instructed on proper tech, freq as well as use of ICE.   Addressed all mobility questions, discussed appropriate activity, educated on use of ICE.  Pt ready for D/C to home.   Recommendations for follow up therapy are one component of a multi-disciplinary discharge planning process, led by the attending physician.  Recommendations may be updated based on patient status, additional functional criteria and insurance authorization.  Follow Up Recommendations  Follow physician's recommendations for discharge plan and follow up therapies     Assistance Recommended at Discharge Intermittent Supervision/Assistance  Patient can return home with the following A little help with walking and/or transfers;A little help with bathing/dressing/bathroom;Assistance with cooking/housework;Assist for transportation;Help with stairs or ramp for entrance   Equipment Recommendations  None recommended by PT    Recommendations for Other Services       Precautions / Restrictions Precautions Precautions: Fall Precaution Comments: instructed no pillow under knee Required Braces or Orthoses: Knee Immobilizer - Left Knee Immobilizer - Left: On when out of bed or walking Restrictions Weight  Bearing Restrictions: No LLE Weight Bearing: Weight bearing as tolerated Other Position/Activity Restrictions: elevation/extension     Mobility  Bed Mobility Overal bed mobility: Needs Assistance Bed Mobility: Supine to Sit     Supine to sit: Supervision, Min guard     General bed mobility comments: OOB in recliner    Transfers Overall transfer level: Needs assistance Equipment used: Rolling walker (2 wheels) Transfers: Sit to/from Stand Sit to Stand: From elevated surface, Min guard           General transfer comment: 25% VC's on proper hand placement and safety with turns.    Ambulation/Gait Ambulation/Gait assistance: Supervision, Min guard Gait Distance (Feet): 75 Feet Assistive device: Rolling walker (2 wheels) Gait Pattern/deviations: Step-to pattern, Decreased stance time - left Gait velocity: decreased     General Gait Details: 25% VC's on proper sequencing, proper walker to self distance and safety with turns.   Stairs Stairs: Yes Stairs assistance: Supervision, Min assist Stair Management: No rails, Step to pattern, Forwards, With walker Number of Stairs: 1 General stair comments: with Spouse "hands on" assisted up/down one step with 50% VC's on proper walker placement as well as sequencing.  Performed twice. Spouse stated she will have extra assist at home to get pt into house.   Wheelchair Mobility    Modified Rankin (Stroke Patients Only)       Balance                                            Cognition Arousal/Alertness: Awake/alert Behavior During Therapy:  WFL for tasks assessed/performed Overall Cognitive Status: Within Functional Limits for tasks assessed                                 General Comments: AxO x 3 vey pleasant        Exercises      General Comments        Pertinent Vitals/Pain Pain Assessment Pain Assessment: 0-10 Pain Score: 5  Pain Location: LEFT KNEE Pain Descriptors /  Indicators: Operative site guarding Pain Intervention(s): Monitored during session, Repositioned, Ice applied, Premedicated before session    Home Living                          Prior Function            PT Goals (current goals can now be found in the care plan section) Progress towards PT goals: Progressing toward goals    Frequency    7X/week      PT Plan Current plan remains appropriate    Co-evaluation              AM-PAC PT "6 Clicks" Mobility   Outcome Measure  Help needed turning from your back to your side while in a flat bed without using bedrails?: A Little Help needed moving from lying on your back to sitting on the side of a flat bed without using bedrails?: A Little Help needed moving to and from a bed to a chair (including a wheelchair)?: A Little Help needed standing up from a chair using your arms (e.g., wheelchair or bedside chair)?: A Little Help needed to walk in hospital room?: A Little Help needed climbing 3-5 steps with a railing? : A Little 6 Click Score: 18    End of Session Equipment Utilized During Treatment: Gait belt Activity Tolerance: Patient tolerated treatment well;No increased pain Patient left: in chair;with call bell/phone within reach;with chair alarm set;with family/visitor present Nurse Communication: Mobility status PT Visit Diagnosis: Difficulty in walking, not elsewhere classified (R26.2);Pain Pain - Right/Left: Left Pain - part of body: Knee     Time: 8250-5397 PT Time Calculation (min) (ACUTE ONLY): 29 min  Charges:  $Gait Training: 8-22 mins $Therapeutic Exercise: 8-22 mins                      Rica Koyanagi  PTA Acute  Rehabilitation Services Pager      [phone] Office      [phone]

## 2021-07-17 NOTE — Progress Notes (Signed)
   Subjective: 1 Day Post-Op Procedure(s) (LRB): TOTAL KNEE ARTHROPLASTY (Left) Patient reports pain as mild.   Patient seen in rounds by Dr. Wynelle Link. Patient is well, and has had no acute complaints or problems No issues overnight. Denies chest pain, SOB, or calf pain. Foley catheter removed this AM.  We will continue therapy today, ambulated 20' yesterday.   Objective: Vital signs in last 24 hours: Temp:  [97.5 F (36.4 C)-98.8 F (37.1 C)] 98.2 F (36.8 C) (06/06 0518) Pulse Rate:  [72-96] 81 (06/06 0518) Resp:  [10-20] 17 (06/06 0518) BP: (131-210)/(77-106) 143/92 (06/06 0518) SpO2:  [92 %-100 %] 96 % (06/06 0518) Weight:  [102.9 kg] 102.9 kg (06/05 0832)  Intake/Output from previous day:  Intake/Output Summary (Last 24 hours) at 07/17/2021 0718 Last data filed at 07/17/2021 1610 Gross per 24 hour  Intake 1706.59 ml  Output 2015 ml  Net -308.41 ml     Intake/Output this shift: No intake/output data recorded.  Labs: Recent Labs    07/17/21 0342  HGB 13.8   Recent Labs    07/17/21 0342  WBC 13.9*  RBC 3.99*  HCT 40.1  PLT 148*   Recent Labs    07/17/21 0342  NA 136  K 4.0  CL 104  CO2 24  BUN 18  CREATININE 1.00  GLUCOSE 146*  CALCIUM 8.4*   No results for input(s): LABPT, INR in the last 72 hours.  Exam: General - Patient is Alert and Oriented Extremity - Neurologically intact Neurovascular intact Sensation intact distally Dorsiflexion/Plantar flexion intact Dressing - dressing C/D/I Motor Function - intact, moving foot and toes well on exam.   Past Medical History:  Diagnosis Date   Anemia    Arthritis    Asbestos exposure    Cataract    GERD (gastroesophageal reflux disease)    Hyperlipidemia    Hypertension    Liver cancer (HCC)    cholangeocarcinoma   PE (pulmonary thromboembolism) (HCC)    Saddle pulmonary embolus (HCC)     Assessment/Plan: 1 Day Post-Op Procedure(s) (LRB): TOTAL KNEE ARTHROPLASTY (Left) Principal Problem:    OA (osteoarthritis) of knee Active Problems:   Osteoarthritis of left knee  Estimated body mass index is 29.12 kg/m as calculated from the following:   Height as of this encounter: '6\' 2"'$  (1.88 m).   Weight as of this encounter: 102.9 kg. Advance diet Up with therapy D/C IV fluids   Patient's anticipated LOS is less than 2 midnights, meeting these requirements: - Lives within 1 hour of care - Has a competent adult at home to recover with post-op recover - NO history of  - Chronic pain requiring opioids  - Diabetes  - Coronary Artery Disease  - Heart failure  - Heart attack  - Stroke  - Cardiac arrhythmia  - Respiratory Failure/COPD  - Renal failure  - Anemia  - Advanced Liver disease  DVT Prophylaxis -  Eliquis Weight bearing as tolerated. Continue therapy.  Plan is to go Home after hospital stay. Plan for discharge later today if progresses with therapy and meeting goals. Scheduled for OPPT at [place]. Follow-up in the office in 2 weeks.  The PDMP database was reviewed today prior to any opioid medications being prescribed to this patient.  Theresa Duty, PA-C Orthopedic Surgery [phone] 07/17/2021, 7:18 AM

## 2021-07-17 NOTE — Plan of Care (Signed)

## 2021-07-17 NOTE — Progress Notes (Signed)
Discharge instructions reviewed with patient and family. All questions and concerns answered. Medicated before departure with pain medication, tolerated well. IV removed per protocol. Vital signs stable,all belongings given to patient.

## 2021-07-17 NOTE — TOC Transition Note (Signed)
Transition of Care [facility]) - CM/SW Discharge Note  Patient Details  Name: Grant Cummings. MRN: [id number] Date of Birth: November 21, 1945  Transition of Care [facility]) CM/SW Contact:  Sherie Don, LCSW Phone Number: 07/17/2021, 12:15 PM  Clinical Narrative: Patient is expected to discharge home after working with PT. CSW met with patient and his wife to confirm discharge plan. Patient will go home with OPPT at [place]. Patient has a rolling walker at home he is borrowing, so there are no DME needs at this time. TOC signing off.  Final next level of care: OP Rehab Barriers to Discharge: No Barriers Identified  Patient Goals and CMS Choice Patient states their goals for this hospitalization and ongoing recovery are:: Discharge home with OPPT at [place] offered to / list presented to : NA  Discharge Plan and Services      DME Arranged: N/A DME Agency: NA HH Arranged: NA HH Agency: NA  Readmission Risk Interventions     View : No data to display.

## 2021-07-18 NOTE — Discharge Summary (Signed)
Patient ID: Grant Cummings. MRN: [id number] DOB/AGE: Apr 17, 1945 76 y.o.  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 is an 76 y.o. male who was admitted 07/16/2021 for operative treatment ofOA (osteoarthritis) of knee. Patient has severe unremitting pain that affects sleep, daily activities, and work/hobbies. After pre-op clearance the patient was taken to the operating room on 07/16/2021 and underwent  Procedure(s): TOTAL KNEE ARTHROPLASTY.    Patient was given perioperative antibiotics:  Anti-infectives (From admission, onward)    Start     Dose/Rate Route Frequency Ordered Stop   07/16/21 1700  ceFAZolin (ANCEF) IVPB 2g/100 mL premix        2 g 200 mL/hr over 30 Minutes Intravenous Every 6 hours 07/16/21 1446 07/16/21 2356   07/16/21 0830  ceFAZolin (ANCEF) IVPB 2g/100 mL premix        2 g 200 mL/hr over 30 Minutes Intravenous On call to O.R. 07/16/21 0818 07/16/21 1115        Patient was given sequential compression devices, early ambulation, and chemoprophylaxis to prevent DVT.  Patient benefited maximally from hospital stay and there were no complications.    Recent vital signs: Patient Vitals for the past 24 hrs:  BP Temp Pulse Resp SpO2  07/17/21 1415 (!) 155/84 98.1 F (36.7 C) 91 18 97 %  07/17/21 0918 134/86 98 F (36.7 C) 80 20 98 %     Recent laboratory studies:   Recent Labs    07/17/21 0342  WBC 13.9*  HGB 13.8  HCT 40.1  PLT 148*  NA 136  K 4.0  CL 104  CO2 24  BUN 18  CREATININE 1.00  GLUCOSE 146*  CALCIUM 8.4*     Discharge Medications:   Allergies as of 07/17/2021       Reactions   Nsaids Other (See Comments)   Stomach bleed from radiation        Medication List     TAKE these medications    acetaminophen 500 MG tablet Commonly known as: TYLENOL Take 500-1,000 mg by mouth every 6 (six) hours as needed (for knee pain.).   Allergy Eye 0.025-0.3 % ophthalmic solution Generic drug: naphazoline-pheniramine 1-2 drops 4 (four) times daily as needed for eye irritation or allergies.   amLODipine 10 MG tablet Commonly known as: NORVASC Take 1 tablet (10 mg total) by mouth daily.   apixaban 5 MG Tabs tablet Commonly known as: Eliquis Take 1 tablet (5 mg total) by mouth 2 (two) times daily.   atorvastatin 40 MG tablet Commonly known as: LIPITOR TAKE ONE (1) TABLET EACH DAY   Blue-Emu Super Strength Crea Apply 1 application. topically 3 (three) times daily as needed (knee pain.).   cholecalciferol 1000 units tablet Commonly known as: VITAMIN D Take 1,000 Units by mouth daily.   fluticasone 50 MCG/ACT nasal spray Commonly known as:  FLONASE Place 1-2 sprays into both nostrils daily as needed for allergies or rhinitis.   gabapentin 300 MG capsule Commonly known as: NEURONTIN Take a 300 mg capsule three times a day for two weeks following surgery.Then take a 300 mg capsule two times a day for two weeks. Then take a 300 mg capsule once a day for two weeks. Then discontinue.   hydrochlorothiazide 25 MG tablet Commonly known as: HYDRODIURIL Take 1 tablet (25 mg total) by mouth daily.   methocarbamol 500 MG tablet Commonly known as: ROBAXIN Take 1 tablet (500 mg total) by mouth every 6 (six) hours as needed for muscle spasms.   multivitamin with minerals Tabs tablet Take 1 tablet by mouth every evening.    oxyCODONE-acetaminophen 5-325 MG tablet Commonly known as: Percocet Take 1-2 tablets by mouth every 6 (six) hours as needed for severe pain.   pantoprazole 40 MG tablet Commonly known as: PROTONIX TAKE ONE TABLET BY MOUTH ONCE DAILY   potassium chloride 10 MEQ tablet Commonly known as: KLOR-CON M Take 1 tablet (10 mEq total) by mouth daily.   traMADol 50 MG tablet Commonly known as: ULTRAM Take 1-2 tablets (50-100 mg total) by mouth every 6 (six) hours as needed for moderate pain.               Discharge Care Instructions  (From admission, onward)           Start     Ordered   07/17/21 0000  Weight bearing as tolerated        07/17/21 0722   07/17/21 0000  Change dressing       Comments: You may remove the bulky bandage (ACE wrap and gauze) two days after surgery. You will have an adhesive waterproof bandage underneath. Leave this in place until your first follow-up appointment.   07/17/21 0722            Diagnostic Studies: No results found.  Disposition: Discharge disposition: 01-Home or Self Care       Discharge Instructions     Call MD / Call 911   Complete by: As directed    If you experience chest pain or shortness of breath, CALL 911 and be transported to the hospital emergency room.  If you develope a fever above 101 F, pus (white drainage) or increased drainage or redness at the wound, or calf pain, call your surgeon's office.   Change dressing   Complete by: As directed    You may remove the bulky bandage (ACE wrap and gauze) two days after surgery. You will have an adhesive waterproof bandage underneath. Leave this in place until your first follow-up appointment.   Constipation Prevention   Complete by: As directed    Drink plenty of fluids.  Prune juice may be helpful.  You may use a stool softener, such as Colace (over the counter) 100 mg twice a day.  Use MiraLax (over the counter) for constipation as needed.   Diet - low sodium heart  healthy   Complete by: As directed    Do not put a pillow under the knee. Place it under the heel.   Complete by: As directed    Driving restrictions   Complete by: As directed    No driving for two weeks   Post-operative opioid taper instructions:   Complete by: As directed    POST-OPERATIVE OPIOID TAPER INSTRUCTIONS: It is important to wean off of your opioid medication as soon as possible. If you  do not need pain medication after your surgery it is ok to stop day one. Opioids include: Codeine, Hydrocodone(Norco, Vicodin), Oxycodone(Percocet, oxycontin) and hydromorphone amongst others.  Long term and even short term use of opiods can cause: Increased pain response Dependence Constipation Depression Respiratory depression And more.  Withdrawal symptoms can include Flu like symptoms Nausea, vomiting And more Techniques to manage these symptoms Hydrate well Eat regular healthy meals Stay active Use relaxation techniques(deep breathing, meditating, yoga) Do Not substitute Alcohol to help with tapering If you have been on opioids for less than two weeks and do not have pain than it is ok to stop all together.  Plan to wean off of opioids This plan should start within one week post op of your joint replacement. Maintain the same interval or time between taking each dose and first decrease the dose.  Cut the total daily intake of opioids by one tablet each day Next start to increase the time between doses. The last dose that should be eliminated is the evening dose.      TED hose   Complete by: As directed    Use stockings (TED hose) for three weeks on both leg(s).  You may remove them at night for sleeping.   Weight bearing as tolerated   Complete by: As directed         Follow-up Information     Gaynelle Arabian, MD. Go on 07/31/2021.   Specialty: Orthopedic Surgery Why: You are scheduled for a follow up appointment on 07-31-21 at 2:00 pm Contact information: [address] [place] [address] [phone]                  Signed: Theresa Duty 07/18/2021, 9:05 AM

## 2021-07-19 DIAGNOSIS — Z96652 Presence of left artificial knee joint: Secondary | ICD-10-CM | POA: Diagnosis not present

## 2021-07-19 DIAGNOSIS — M25562 Pain in left knee: Secondary | ICD-10-CM | POA: Diagnosis not present

## 2021-07-19 DIAGNOSIS — Z471 Aftercare following joint replacement surgery: Secondary | ICD-10-CM | POA: Diagnosis not present

## 2021-07-19 DIAGNOSIS — M6281 Muscle weakness (generalized): Secondary | ICD-10-CM | POA: Diagnosis not present

## 2021-07-23 ENCOUNTER — Other Ambulatory Visit: Payer: Self-pay | Admitting: Family Medicine

## 2021-07-23 DIAGNOSIS — M6281 Muscle weakness (generalized): Secondary | ICD-10-CM | POA: Diagnosis not present

## 2021-07-23 DIAGNOSIS — Z96652 Presence of left artificial knee joint: Secondary | ICD-10-CM | POA: Diagnosis not present

## 2021-07-23 DIAGNOSIS — M25562 Pain in left knee: Secondary | ICD-10-CM | POA: Diagnosis not present

## 2021-07-23 DIAGNOSIS — Z471 Aftercare following joint replacement surgery: Secondary | ICD-10-CM | POA: Diagnosis not present

## 2021-07-23 DIAGNOSIS — I10 Essential (primary) hypertension: Secondary | ICD-10-CM

## 2021-07-25 ENCOUNTER — Telehealth: Payer: Self-pay | Admitting: Family Medicine

## 2021-07-25 NOTE — Telephone Encounter (Signed)
Spouse calling says that patient bp has been elevated since pt had total knee replacement surgery July 16, 2021. Pt is taking his bp rx everyday but still seems to be running high. Pt does not want to schedule an apt until he gets advice from [facility] about this. Both spouse and pt are aware [facility] will be back on Monday of week and will wait for a call then.

## 2021-07-26 DIAGNOSIS — M6281 Muscle weakness (generalized): Secondary | ICD-10-CM | POA: Diagnosis not present

## 2021-07-26 DIAGNOSIS — Z96652 Presence of left artificial knee joint: Secondary | ICD-10-CM | POA: Diagnosis not present

## 2021-07-26 DIAGNOSIS — M25562 Pain in left knee: Secondary | ICD-10-CM | POA: Diagnosis not present

## 2021-07-26 DIAGNOSIS — Z471 Aftercare following joint replacement surgery: Secondary | ICD-10-CM | POA: Diagnosis not present

## 2021-07-30 DIAGNOSIS — Z471 Aftercare following joint replacement surgery: Secondary | ICD-10-CM | POA: Diagnosis not present

## 2021-07-30 DIAGNOSIS — M25562 Pain in left knee: Secondary | ICD-10-CM | POA: Diagnosis not present

## 2021-07-30 DIAGNOSIS — M6281 Muscle weakness (generalized): Secondary | ICD-10-CM | POA: Diagnosis not present

## 2021-07-30 DIAGNOSIS — Z96652 Presence of left artificial knee joint: Secondary | ICD-10-CM | POA: Diagnosis not present

## 2021-08-01 DIAGNOSIS — M6281 Muscle weakness (generalized): Secondary | ICD-10-CM | POA: Diagnosis not present

## 2021-08-01 DIAGNOSIS — Z96652 Presence of left artificial knee joint: Secondary | ICD-10-CM | POA: Diagnosis not present

## 2021-08-01 DIAGNOSIS — Z471 Aftercare following joint replacement surgery: Secondary | ICD-10-CM | POA: Diagnosis not present

## 2021-08-01 DIAGNOSIS — M25562 Pain in left knee: Secondary | ICD-10-CM | POA: Diagnosis not present

## 2021-08-01 NOTE — Telephone Encounter (Signed)
I don't have his BPs on my desk, what have they been running?

## 2021-08-01 NOTE — Telephone Encounter (Signed)
Pt did not drop of numbers. Per wife pt has an appt in the morning and will discuss with [facility] at that time.

## 2021-08-02 ENCOUNTER — Ambulatory Visit (INDEPENDENT_AMBULATORY_CARE_PROVIDER_SITE_OTHER): Payer: PPO | Admitting: Family Medicine

## 2021-08-02 ENCOUNTER — Encounter: Payer: Self-pay | Admitting: Family Medicine

## 2021-08-02 VITALS — BP 135/82 | HR 72 | Temp 97.9°F | Ht 74.0 in | Wt 223.0 lb

## 2021-08-02 DIAGNOSIS — I10 Essential (primary) hypertension: Secondary | ICD-10-CM

## 2021-08-02 MED ORDER — CARVEDILOL 6.25 MG PO TABS: 6.2500 mg | ORAL_TABLET | Freq: Two times a day (BID) | ORAL | 3 refills | Status: DC

## 2021-08-02 NOTE — Progress Notes (Signed)
BP 135/82   Pulse 72   Temp 97.9 F (36.6 C)   Ht '6\' 2"'$  (1.88 m)   Wt 223 lb (101.2 kg)   SpO2 97%   BMI 28.63 kg/m    Subjective:   Patient ID: Grant Altes., male    DOB: Jul 30, 1945, 76 y.o.   MRN: [id number]  HPI: Grant Mickelson. is a 76 y.o. male presenting on 08/02/2021 for Hypertension (C/O some HA, no change in vision. BLE edema. R>L. Compression hose helpful.)   HPI Hypertension Patient is currently on amlodipine, and their blood pressure today is 135/82. Patient denies any lightheadedness or dizziness. Patient denies headaches, blurred vision, chest pains, shortness of breath, or weakness. Denies any side effects from medication and is content with current medication.  This at home his blood pressure is running in the 150s and 160s all of the time, he never gets the 130s or 140s at home anymore.  Is been going on for the past few months and every time that physical therapy or somebody else comes then it does the same.  Relevant past medical, surgical, family and social history reviewed and updated as indicated. Interim medical history since our last visit reviewed. Allergies and medications reviewed and updated.  Review of Systems  Constitutional:  Negative for chills and fever.  Eyes:  Negative for visual disturbance.  Respiratory:  Negative for shortness of breath and wheezing.   Cardiovascular:  Negative for chest pain and leg swelling.  Musculoskeletal:  Negative for back pain and gait problem.  Skin:  Negative for rash.  Neurological:  Negative for dizziness, weakness and light-headedness.  All other systems reviewed and are negative.   Per HPI unless specifically indicated above   Allergies as of 08/02/2021       Reactions   Nsaids Other (See Comments)   Stomach bleed from radiation        Medication List        Accurate as of August 02, 2021 10:59 AM. If you have any questions, ask your nurse or doctor.          STOP taking these  medications    atorvastatin 40 MG tablet Commonly known as: LIPITOR Stopped by: Fransisca Kaufmann Lashonda Sonneborn, MD       TAKE these medications    acetaminophen 500 MG tablet Commonly known as: TYLENOL Take 500-1,000 mg by mouth every 6 (six) hours as needed (for knee pain.).   Allergy Eye 0.025-0.3 % ophthalmic solution Generic drug: naphazoline-pheniramine 1-2 drops 4 (four) times daily as needed for eye irritation or allergies.   amLODipine 10 MG tablet Commonly known as: NORVASC Take 1 tablet (10 mg total) by mouth daily.   apixaban 5 MG Tabs tablet Commonly known as: Eliquis Take 1 tablet (5 mg total) by mouth 2 (two) times daily.   Blue-Emu Super Strength Crea Apply 1 application. topically 3 (three) times daily as needed (knee pain.).   carvedilol 6.25 MG tablet Commonly known as: COREG Take 1 tablet (6.25 mg total) by mouth 2 (two) times daily with a meal. Started by: Fransisca Kaufmann Logun Colavito, MD   cholecalciferol 1000 units tablet Commonly known as: VITAMIN D Take 1,000 Units by mouth daily.   fluticasone 50 MCG/ACT nasal spray Commonly known as: FLONASE Place 1-2 sprays into both nostrils daily as needed for allergies or rhinitis.   gabapentin 300 MG capsule Commonly known as: NEURONTIN Take a 300 mg capsule three times a day for two weeks  following surgery.Then take a 300 mg capsule two times a day for two weeks. Then take a 300 mg capsule once a day for two weeks. Then discontinue.   hydrochlorothiazide 25 MG tablet Commonly known as: HYDRODIURIL Take 1 tablet (25 mg total) by mouth daily.   methocarbamol 500 MG tablet Commonly known as: ROBAXIN Take 1 tablet (500 mg total) by mouth every 6 (six) hours as needed for muscle spasms.   multivitamin with minerals Tabs tablet Take 1 tablet by mouth every evening.   oxyCODONE-acetaminophen 5-325 MG tablet Commonly known as: Percocet Take 1-2 tablets by mouth every 6 (six) hours as needed for severe pain.    pantoprazole 40 MG tablet Commonly known as: PROTONIX TAKE ONE TABLET BY MOUTH ONCE DAILY   potassium chloride 10 MEQ tablet Commonly known as: KLOR-CON M Take 1 tablet (10 mEq total) by mouth daily.   traMADol 50 MG tablet Commonly known as: ULTRAM Take 1-2 tablets (50-100 mg total) by mouth every 6 (six) hours as needed for moderate pain.         Objective:   BP 135/82   Pulse 72   Temp 97.9 F (36.6 C)   Ht '6\' 2"'$  (1.88 m)   Wt 223 lb (101.2 kg)   SpO2 97%   BMI 28.63 kg/m   Wt Readings from Last 3 Encounters:  08/02/21 223 lb (101.2 kg)  07/16/21 226 lb 12.8 oz (102.9 kg)  07/04/21 226 lb 12.8 oz (102.9 kg)    Physical Exam Vitals and nursing note reviewed.  Constitutional:      General: He is not in acute distress.    Appearance: He is well-developed. He is not diaphoretic.  Eyes:     General: No scleral icterus.    Conjunctiva/sclera: Conjunctivae normal.  Neck:     Thyroid: No thyromegaly.  Cardiovascular:     Rate and Rhythm: Normal rate and regular rhythm.     Heart sounds: Normal heart sounds. No murmur heard. Pulmonary:     Effort: Pulmonary effort is normal. No respiratory distress.     Breath sounds: Normal breath sounds. No wheezing.  Musculoskeletal:        General: Normal range of motion.     Cervical back: Neck supple.  Lymphadenopathy:     Cervical: No cervical adenopathy.  Skin:    General: Skin is warm and dry.     Findings: No rash.  Neurological:     Mental Status: He is alert and oriented to person, place, and time.     Coordination: Coordination normal.  Psychiatric:        Behavior: Behavior normal.       Assessment & Plan:   Problem List Items Addressed This Visit       Cardiovascular and Mediastinum   Hypertension - Primary   Relevant Medications   carvedilol (COREG) 6.25 MG tablet  We will start or add carvedilol to his regiment and see if that controls his blood pressure just a little bit better.  Call me back  to numbers in 2 weeks and see how it is    Follow up plan: Return if symptoms worsen or fail to improve.  Counseling provided for all of the vaccine components No orders of the defined types were placed in this encounter.   Caryl Pina, MD [facility] 08/02/2021, 10:59 AM

## 2021-08-03 DIAGNOSIS — R262 Difficulty in walking, not elsewhere classified: Secondary | ICD-10-CM | POA: Diagnosis not present

## 2021-08-03 DIAGNOSIS — M25562 Pain in left knee: Secondary | ICD-10-CM | POA: Diagnosis not present

## 2021-08-03 DIAGNOSIS — Z471 Aftercare following joint replacement surgery: Secondary | ICD-10-CM | POA: Diagnosis not present

## 2021-08-03 DIAGNOSIS — M6281 Muscle weakness (generalized): Secondary | ICD-10-CM | POA: Diagnosis not present

## 2021-08-07 DIAGNOSIS — M25562 Pain in left knee: Secondary | ICD-10-CM | POA: Diagnosis not present

## 2021-08-07 DIAGNOSIS — M6281 Muscle weakness (generalized): Secondary | ICD-10-CM | POA: Diagnosis not present

## 2021-08-07 DIAGNOSIS — Z471 Aftercare following joint replacement surgery: Secondary | ICD-10-CM | POA: Diagnosis not present

## 2021-08-07 DIAGNOSIS — R262 Difficulty in walking, not elsewhere classified: Secondary | ICD-10-CM | POA: Diagnosis not present

## 2021-08-09 DIAGNOSIS — M25562 Pain in left knee: Secondary | ICD-10-CM | POA: Diagnosis not present

## 2021-08-09 DIAGNOSIS — R262 Difficulty in walking, not elsewhere classified: Secondary | ICD-10-CM | POA: Diagnosis not present

## 2021-08-09 DIAGNOSIS — M6281 Muscle weakness (generalized): Secondary | ICD-10-CM | POA: Diagnosis not present

## 2021-08-09 DIAGNOSIS — Z471 Aftercare following joint replacement surgery: Secondary | ICD-10-CM | POA: Diagnosis not present

## 2021-08-13 DIAGNOSIS — Z471 Aftercare following joint replacement surgery: Secondary | ICD-10-CM | POA: Diagnosis not present

## 2021-08-13 DIAGNOSIS — R262 Difficulty in walking, not elsewhere classified: Secondary | ICD-10-CM | POA: Diagnosis not present

## 2021-08-13 DIAGNOSIS — M6281 Muscle weakness (generalized): Secondary | ICD-10-CM | POA: Diagnosis not present

## 2021-08-13 DIAGNOSIS — M25562 Pain in left knee: Secondary | ICD-10-CM | POA: Diagnosis not present

## 2021-08-15 DIAGNOSIS — R262 Difficulty in walking, not elsewhere classified: Secondary | ICD-10-CM | POA: Diagnosis not present

## 2021-08-15 DIAGNOSIS — M25562 Pain in left knee: Secondary | ICD-10-CM | POA: Diagnosis not present

## 2021-08-15 DIAGNOSIS — Z471 Aftercare following joint replacement surgery: Secondary | ICD-10-CM | POA: Diagnosis not present

## 2021-08-15 DIAGNOSIS — M6281 Muscle weakness (generalized): Secondary | ICD-10-CM | POA: Diagnosis not present

## 2021-08-16 ENCOUNTER — Encounter: Payer: Self-pay | Admitting: Family Medicine

## 2021-08-20 DIAGNOSIS — M6281 Muscle weakness (generalized): Secondary | ICD-10-CM | POA: Diagnosis not present

## 2021-08-20 DIAGNOSIS — M25562 Pain in left knee: Secondary | ICD-10-CM | POA: Diagnosis not present

## 2021-08-20 DIAGNOSIS — Z471 Aftercare following joint replacement surgery: Secondary | ICD-10-CM | POA: Diagnosis not present

## 2021-08-20 DIAGNOSIS — R262 Difficulty in walking, not elsewhere classified: Secondary | ICD-10-CM | POA: Diagnosis not present

## 2021-08-22 DIAGNOSIS — M6281 Muscle weakness (generalized): Secondary | ICD-10-CM | POA: Diagnosis not present

## 2021-08-22 DIAGNOSIS — M25562 Pain in left knee: Secondary | ICD-10-CM | POA: Diagnosis not present

## 2021-08-22 DIAGNOSIS — Z471 Aftercare following joint replacement surgery: Secondary | ICD-10-CM | POA: Diagnosis not present

## 2021-08-22 DIAGNOSIS — R262 Difficulty in walking, not elsewhere classified: Secondary | ICD-10-CM | POA: Diagnosis not present

## 2021-08-23 DIAGNOSIS — Z5189 Encounter for other specified aftercare: Secondary | ICD-10-CM | POA: Diagnosis not present

## 2021-08-28 DIAGNOSIS — M6281 Muscle weakness (generalized): Secondary | ICD-10-CM | POA: Diagnosis not present

## 2021-08-28 DIAGNOSIS — M25562 Pain in left knee: Secondary | ICD-10-CM | POA: Diagnosis not present

## 2021-08-28 DIAGNOSIS — R262 Difficulty in walking, not elsewhere classified: Secondary | ICD-10-CM | POA: Diagnosis not present

## 2021-08-28 DIAGNOSIS — Z471 Aftercare following joint replacement surgery: Secondary | ICD-10-CM | POA: Diagnosis not present

## 2021-08-30 DIAGNOSIS — M6281 Muscle weakness (generalized): Secondary | ICD-10-CM | POA: Diagnosis not present

## 2021-08-30 DIAGNOSIS — M25562 Pain in left knee: Secondary | ICD-10-CM | POA: Diagnosis not present

## 2021-08-30 DIAGNOSIS — Z471 Aftercare following joint replacement surgery: Secondary | ICD-10-CM | POA: Diagnosis not present

## 2021-08-30 DIAGNOSIS — R262 Difficulty in walking, not elsewhere classified: Secondary | ICD-10-CM | POA: Diagnosis not present

## 2021-09-04 DIAGNOSIS — M25562 Pain in left knee: Secondary | ICD-10-CM | POA: Diagnosis not present

## 2021-09-04 DIAGNOSIS — Z471 Aftercare following joint replacement surgery: Secondary | ICD-10-CM | POA: Diagnosis not present

## 2021-09-04 DIAGNOSIS — R262 Difficulty in walking, not elsewhere classified: Secondary | ICD-10-CM | POA: Diagnosis not present

## 2021-09-04 DIAGNOSIS — M6281 Muscle weakness (generalized): Secondary | ICD-10-CM | POA: Diagnosis not present

## 2021-09-06 DIAGNOSIS — Z471 Aftercare following joint replacement surgery: Secondary | ICD-10-CM | POA: Diagnosis not present

## 2021-09-06 DIAGNOSIS — M25562 Pain in left knee: Secondary | ICD-10-CM | POA: Diagnosis not present

## 2021-09-06 DIAGNOSIS — M6281 Muscle weakness (generalized): Secondary | ICD-10-CM | POA: Diagnosis not present

## 2021-09-06 DIAGNOSIS — M24562 Contracture, left knee: Secondary | ICD-10-CM | POA: Diagnosis not present

## 2021-09-06 DIAGNOSIS — R262 Difficulty in walking, not elsewhere classified: Secondary | ICD-10-CM | POA: Diagnosis not present

## 2021-09-10 ENCOUNTER — Other Ambulatory Visit: Payer: Self-pay | Admitting: Family Medicine

## 2021-09-10 DIAGNOSIS — I10 Essential (primary) hypertension: Secondary | ICD-10-CM

## 2021-09-11 DIAGNOSIS — Z471 Aftercare following joint replacement surgery: Secondary | ICD-10-CM | POA: Diagnosis not present

## 2021-09-11 DIAGNOSIS — M6281 Muscle weakness (generalized): Secondary | ICD-10-CM | POA: Diagnosis not present

## 2021-09-11 DIAGNOSIS — R262 Difficulty in walking, not elsewhere classified: Secondary | ICD-10-CM | POA: Diagnosis not present

## 2021-09-11 DIAGNOSIS — M25562 Pain in left knee: Secondary | ICD-10-CM | POA: Diagnosis not present

## 2021-09-11 DIAGNOSIS — M24562 Contracture, left knee: Secondary | ICD-10-CM | POA: Diagnosis not present

## 2021-09-14 DIAGNOSIS — Z471 Aftercare following joint replacement surgery: Secondary | ICD-10-CM | POA: Diagnosis not present

## 2021-09-14 DIAGNOSIS — M6281 Muscle weakness (generalized): Secondary | ICD-10-CM | POA: Diagnosis not present

## 2021-09-14 DIAGNOSIS — M25562 Pain in left knee: Secondary | ICD-10-CM | POA: Diagnosis not present

## 2021-09-14 DIAGNOSIS — R262 Difficulty in walking, not elsewhere classified: Secondary | ICD-10-CM | POA: Diagnosis not present

## 2021-09-18 DIAGNOSIS — R262 Difficulty in walking, not elsewhere classified: Secondary | ICD-10-CM | POA: Diagnosis not present

## 2021-09-18 DIAGNOSIS — M6281 Muscle weakness (generalized): Secondary | ICD-10-CM | POA: Diagnosis not present

## 2021-09-18 DIAGNOSIS — Z471 Aftercare following joint replacement surgery: Secondary | ICD-10-CM | POA: Diagnosis not present

## 2021-09-18 DIAGNOSIS — M25562 Pain in left knee: Secondary | ICD-10-CM | POA: Diagnosis not present

## 2021-09-21 DIAGNOSIS — Z471 Aftercare following joint replacement surgery: Secondary | ICD-10-CM | POA: Diagnosis not present

## 2021-09-21 DIAGNOSIS — M6281 Muscle weakness (generalized): Secondary | ICD-10-CM | POA: Diagnosis not present

## 2021-09-21 DIAGNOSIS — R262 Difficulty in walking, not elsewhere classified: Secondary | ICD-10-CM | POA: Diagnosis not present

## 2021-09-21 DIAGNOSIS — M25562 Pain in left knee: Secondary | ICD-10-CM | POA: Diagnosis not present

## 2021-09-24 ENCOUNTER — Other Ambulatory Visit: Payer: Self-pay | Admitting: Family Medicine

## 2021-09-24 DIAGNOSIS — K219 Gastro-esophageal reflux disease without esophagitis: Secondary | ICD-10-CM

## 2021-09-25 ENCOUNTER — Telehealth: Payer: Self-pay | Admitting: Family Medicine

## 2021-09-25 DIAGNOSIS — I10 Essential (primary) hypertension: Secondary | ICD-10-CM

## 2021-09-25 NOTE — Telephone Encounter (Signed)
Inusrance denied the refill on carvedilol (COREG) 6.25 MG tablet because pt is early but rx was increased in dosage. Pt needs a new rx 2 tablets 2x a day. Pt will be out of the rx today. Spouse aware [facility] is off today and will address tomorrow. Use [facility]

## 2021-09-26 MED ORDER — CARVEDILOL 6.25 MG PO TABS: 12.5000 mg | ORAL_TABLET | Freq: Two times a day (BID) | ORAL | 3 refills | Status: DC

## 2021-09-26 NOTE — Telephone Encounter (Signed)
Sent new prescription for carvedilol

## 2021-10-07 DIAGNOSIS — M24562 Contracture, left knee: Secondary | ICD-10-CM | POA: Diagnosis not present

## 2021-10-17 DIAGNOSIS — L570 Actinic keratosis: Secondary | ICD-10-CM | POA: Diagnosis not present

## 2021-10-24 ENCOUNTER — Encounter: Payer: Self-pay | Admitting: Family Medicine

## 2021-10-24 ENCOUNTER — Ambulatory Visit (INDEPENDENT_AMBULATORY_CARE_PROVIDER_SITE_OTHER): Payer: PPO | Admitting: Family Medicine

## 2021-10-24 VITALS — BP 133/71 | HR 65 | Temp 98.2°F | Ht 74.0 in | Wt 219.0 lb

## 2021-10-24 DIAGNOSIS — E782 Mixed hyperlipidemia: Secondary | ICD-10-CM

## 2021-10-24 DIAGNOSIS — K219 Gastro-esophageal reflux disease without esophagitis: Secondary | ICD-10-CM

## 2021-10-24 DIAGNOSIS — Z86711 Personal history of pulmonary embolism: Secondary | ICD-10-CM

## 2021-10-24 DIAGNOSIS — I10 Essential (primary) hypertension: Secondary | ICD-10-CM

## 2021-10-24 DIAGNOSIS — N400 Enlarged prostate without lower urinary tract symptoms: Secondary | ICD-10-CM | POA: Diagnosis not present

## 2021-10-24 MED ORDER — APIXABAN 5 MG PO TABS: 5.0000 mg | ORAL_TABLET | Freq: Two times a day (BID) | ORAL | 3 refills | Status: DC

## 2021-10-24 MED ORDER — PANTOPRAZOLE SODIUM 40 MG PO TBEC: 40.0000 mg | DELAYED_RELEASE_TABLET | Freq: Every day | ORAL | 3 refills | Status: DC

## 2021-10-24 MED ORDER — HYDROCHLOROTHIAZIDE 25 MG PO TABS: 25.0000 mg | ORAL_TABLET | Freq: Every day | ORAL | 3 refills | Status: DC

## 2021-10-24 MED ORDER — CARVEDILOL 12.5 MG PO TABS: 12.5000 mg | ORAL_TABLET | Freq: Two times a day (BID) | ORAL | 3 refills | Status: DC

## 2021-10-24 MED ORDER — POTASSIUM CHLORIDE CRYS ER 10 MEQ PO TBCR: 10.0000 meq | EXTENDED_RELEASE_TABLET | Freq: Every day | ORAL | 3 refills | Status: DC

## 2021-10-24 MED ORDER — AMLODIPINE BESYLATE 10 MG PO TABS: 10.0000 mg | ORAL_TABLET | Freq: Every day | ORAL | 3 refills | Status: DC

## 2021-10-24 NOTE — Progress Notes (Signed)
BP 133/71   Pulse 65   Temp 98.2 F (36.8 C)   Ht '6\' 2"'$  (1.88 m)   Wt 219 lb (99.3 kg)   SpO2 97%   BMI 28.12 kg/m    Subjective:   Patient ID: Grant Altes., male    DOB: 1945/12/20, 76 y.o.   MRN: [id number]  HPI: Grant Crean. is a 76 y.o. male presenting on 10/24/2021 for Medical Management of Chronic Issues and Hypertension   HPI Hyperlipidemia Patient is coming in for recheck of his hyperlipidemia. The patient is currently taking none currently. They deny any issues with myalgias or history of liver damage from it. They deny any focal numbness or weakness or chest pain.   Hypertension Patient is currently on carvedilol and hydrochlorothiazide and amlodipine, and their blood pressure today is 133/71. Patient denies any lightheadedness or dizziness. Patient denies headaches, blurred vision, chest pains, shortness of breath, or weakness. Denies any side effects from medication and is content with current medication.   BPH Patient is coming in for recheck on BPH Symptoms: None currently Medication: None currently Last PSA: 2 years ago  History of PE Patient has a history of PE with history of cancer, is currently on Eliquis long-term  Relevant past medical, surgical, family and social history reviewed and updated as indicated. Interim medical history since our last visit reviewed. Allergies and medications reviewed and updated.  Review of Systems  Constitutional:  Negative for chills and fever.  Eyes:  Negative for visual disturbance.  Respiratory:  Negative for shortness of breath and wheezing.   Cardiovascular:  Negative for chest pain and leg swelling.  Musculoskeletal:  Negative for back pain and gait problem.  Skin:  Negative for rash.  Neurological:  Negative for dizziness, weakness and light-headedness.  All other systems reviewed and are negative.   Per HPI unless specifically indicated above   Allergies as of 10/24/2021       Reactions    Nsaids Other (See Comments)   Stomach bleed from radiation        Medication List        Accurate as of October 24, 2021  9:01 AM. If you have any questions, ask your nurse or doctor.          STOP taking these medications    oxyCODONE-acetaminophen 5-325 MG tablet Commonly known as: Percocet Stopped by: Grant Kaufmann Roniya Tetro, MD   traMADol 50 MG tablet Commonly known as: ULTRAM Stopped by: Grant Kaufmann Onnie Hatchel, MD       TAKE these medications    acetaminophen 500 MG tablet Commonly known as: TYLENOL Take 500-1,000 mg by mouth every 6 (six) hours as needed (for knee pain.).   Allergy Eye 0.025-0.3 % ophthalmic solution Generic drug: naphazoline-pheniramine 1-2 drops 4 (four) times daily as needed for eye irritation or allergies.   amLODipine 10 MG tablet Commonly known as: NORVASC Take 1 tablet (10 mg total) by mouth daily.   apixaban 5 MG Tabs tablet Commonly known as: Eliquis Take 1 tablet (5 mg total) by mouth 2 (two) times daily.   Blue-Emu Super Strength Crea Apply 1 application. topically 3 (three) times daily as needed (knee pain.).   carvedilol 12.5 MG tablet Commonly known as: COREG Take 1 tablet (12.5 mg total) by mouth 2 (two) times daily with a meal. What changed: medication strength Changed by: Grant Kaufmann Yacqub Baston, MD   cholecalciferol 1000 units tablet Commonly known as: VITAMIN D Take 1,000 Units by  mouth daily.   fluticasone 50 MCG/ACT nasal spray Commonly known as: FLONASE Place 1-2 sprays into both nostrils daily as needed for allergies or rhinitis.   gabapentin 300 MG capsule Commonly known as: NEURONTIN Take a 300 mg capsule three times a day for two weeks following surgery.Then take a 300 mg capsule two times a day for two weeks. Then take a 300 mg capsule once a day for two weeks. Then discontinue.   hydrochlorothiazide 25 MG tablet Commonly known as: HYDRODIURIL Take 1 tablet (25 mg total) by mouth daily.   methocarbamol 500  MG tablet Commonly known as: ROBAXIN Take 1 tablet (500 mg total) by mouth every 6 (six) hours as needed for muscle spasms.   multivitamin with minerals Tabs tablet Take 1 tablet by mouth every evening.   pantoprazole 40 MG tablet Commonly known as: PROTONIX Take 1 tablet (40 mg total) by mouth daily.   potassium chloride 10 MEQ tablet Commonly known as: KLOR-CON M Take 1 tablet (10 mEq total) by mouth daily.         Objective:   BP 133/71   Pulse 65   Temp 98.2 F (36.8 C)   Ht '6\' 2"'$  (1.88 m)   Wt 219 lb (99.3 kg)   SpO2 97%   BMI 28.12 kg/m   Wt Readings from Last 3 Encounters:  10/24/21 219 lb (99.3 kg)  08/02/21 223 lb (101.2 kg)  07/16/21 226 lb 12.8 oz (102.9 kg)    Physical Exam Vitals and nursing note reviewed.  Constitutional:      General: He is not in acute distress.    Appearance: He is well-developed. He is not diaphoretic.  Eyes:     General: No scleral icterus.    Conjunctiva/sclera: Conjunctivae normal.  Neck:     Thyroid: No thyromegaly.  Cardiovascular:     Rate and Rhythm: Normal rate and regular rhythm.     Heart sounds: Normal heart sounds. No murmur heard. Pulmonary:     Effort: Pulmonary effort is normal. No respiratory distress.     Breath sounds: Normal breath sounds. No wheezing.  Musculoskeletal:        General: No swelling. Normal range of motion.     Cervical back: Neck supple.  Lymphadenopathy:     Cervical: No cervical adenopathy.  Skin:    General: Skin is warm and dry.     Findings: No rash.  Neurological:     Mental Status: He is alert and oriented to person, place, and time.     Coordination: Coordination normal.  Psychiatric:        Behavior: Behavior normal.       Assessment & Plan:   Problem List Items Addressed This Visit       Cardiovascular and Mediastinum   Hypertension   Relevant Medications   amLODipine (NORVASC) 10 MG tablet   apixaban (ELIQUIS) 5 MG TABS tablet   hydrochlorothiazide  (HYDRODIURIL) 25 MG tablet   potassium chloride (KLOR-CON M) 10 MEQ tablet   carvedilol (COREG) 12.5 MG tablet     Genitourinary   BPH (benign prostatic hyperplasia) - Primary   Relevant Orders   PSA, total and free     Other   Hyperlipidemia   Relevant Medications   amLODipine (NORVASC) 10 MG tablet   apixaban (ELIQUIS) 5 MG TABS tablet   hydrochlorothiazide (HYDRODIURIL) 25 MG tablet   carvedilol (COREG) 12.5 MG tablet   Personal history of PE (pulmonary embolism)   Other Visit Diagnoses  Essential hypertension       Relevant Medications   amLODipine (NORVASC) 10 MG tablet   apixaban (ELIQUIS) 5 MG TABS tablet   hydrochlorothiazide (HYDRODIURIL) 25 MG tablet   potassium chloride (KLOR-CON M) 10 MEQ tablet   carvedilol (COREG) 12.5 MG tablet   Other Relevant Orders   CBC with Differential/Platelet   Lipid panel   Gastroesophageal reflux disease without esophagitis       Relevant Medications   pantoprazole (PROTONIX) 40 MG tablet       Continue current medicine, seems to be doing well, will check blood work today. Follow up plan: Return in about 6 months (around 04/24/2022), or if symptoms worsen or fail to improve, for Hypertension and cholesterol BPH.  Counseling provided for all of the vaccine components Orders Placed This Encounter  Procedures   CBC with Differential/Platelet   Lipid panel   PSA, total and free    Grant Pina, MD [facility] 10/24/2021, 9:01 AM

## 2021-10-25 LAB — CBC WITH DIFFERENTIAL/PLATELET
Basophils Absolute: 0 10*3/uL (ref 0.0–0.2)
Basos: 0 %
EOS (ABSOLUTE): 0.1 10*3/uL (ref 0.0–0.4)
Eos: 2 %
Hematocrit: 42.1 % (ref 37.5–51.0)
Hemoglobin: 14.3 g/dL (ref 13.0–17.7)
Immature Grans (Abs): 0 10*3/uL (ref 0.0–0.1)
Immature Granulocytes: 0 %
Lymphocytes Absolute: 0.7 10*3/uL (ref 0.7–3.1)
Lymphs: 12 %
MCH: 32.8 pg (ref 26.6–33.0)
MCHC: 34 g/dL (ref 31.5–35.7)
MCV: 97 fL (ref 79–97)
Monocytes Absolute: 0.6 10*3/uL (ref 0.1–0.9)
Monocytes: 11 %
Neutrophils Absolute: 4.3 10*3/uL (ref 1.4–7.0)
Neutrophils: 75 %
Platelets: 187 10*3/uL (ref 150–450)
RBC: 4.36 x10E6/uL (ref 4.14–5.80)
RDW: 13.2 % (ref 11.6–15.4)
WBC: 5.7 10*3/uL (ref 3.4–10.8)

## 2021-10-25 LAB — PSA, TOTAL AND FREE
PSA, Free Pct: 25 %
PSA, Free: 0.05 ng/mL
Prostate Specific Ag, Serum: 0.2 ng/mL (ref 0.0–4.0)

## 2021-10-25 LAB — LIPID PANEL
Chol/HDL Ratio: 4.5 ratio (ref 0.0–5.0)
Cholesterol, Total: 170 mg/dL (ref 100–199)
HDL: 38 mg/dL — ABNORMAL LOW (ref >39)
LDL Chol Calc (NIH): 103 mg/dL — ABNORMAL HIGH (ref 0–99)
Triglycerides: 166 mg/dL — ABNORMAL HIGH (ref 0–149)
VLDL Cholesterol Cal: 29 mg/dL (ref 5–40)

## 2021-11-07 DIAGNOSIS — M24562 Contracture, left knee: Secondary | ICD-10-CM | POA: Diagnosis not present

## 2021-11-29 DIAGNOSIS — M722 Plantar fascial fibromatosis: Secondary | ICD-10-CM | POA: Diagnosis not present

## 2021-11-29 DIAGNOSIS — Z4789 Encounter for other orthopedic aftercare: Secondary | ICD-10-CM | POA: Diagnosis not present

## 2021-12-07 DIAGNOSIS — M24562 Contracture, left knee: Secondary | ICD-10-CM | POA: Diagnosis not present

## 2021-12-19 DIAGNOSIS — K821 Hydrops of gallbladder: Secondary | ICD-10-CM | POA: Diagnosis not present

## 2021-12-19 DIAGNOSIS — D649 Anemia, unspecified: Secondary | ICD-10-CM | POA: Diagnosis not present

## 2021-12-19 DIAGNOSIS — K862 Cyst of pancreas: Secondary | ICD-10-CM | POA: Diagnosis not present

## 2021-12-19 DIAGNOSIS — R911 Solitary pulmonary nodule: Secondary | ICD-10-CM | POA: Diagnosis not present

## 2021-12-19 DIAGNOSIS — D490 Neoplasm of unspecified behavior of digestive system: Secondary | ICD-10-CM | POA: Diagnosis not present

## 2021-12-19 DIAGNOSIS — N2889 Other specified disorders of kidney and ureter: Secondary | ICD-10-CM | POA: Diagnosis not present

## 2021-12-19 DIAGNOSIS — Z86711 Personal history of pulmonary embolism: Secondary | ICD-10-CM | POA: Diagnosis not present

## 2021-12-19 DIAGNOSIS — C221 Intrahepatic bile duct carcinoma: Secondary | ICD-10-CM | POA: Diagnosis not present

## 2021-12-21 DIAGNOSIS — D3131 Benign neoplasm of right choroid: Secondary | ICD-10-CM | POA: Diagnosis not present

## 2021-12-21 DIAGNOSIS — H524 Presbyopia: Secondary | ICD-10-CM | POA: Diagnosis not present

## 2022-01-01 ENCOUNTER — Telehealth: Payer: Self-pay | Admitting: Family Medicine

## 2022-01-01 NOTE — Telephone Encounter (Signed)
Left message for patient to call back and schedule Medicare Annual Wellness Visit (AWV) to be completed by video or phone.   Last AWV: 01/19/2021   Please schedule at anytime with [place]     45 minute appointment  Any questions, please contact me at [phone]

## 2022-01-07 DIAGNOSIS — M24562 Contracture, left knee: Secondary | ICD-10-CM | POA: Diagnosis not present

## 2022-01-08 DIAGNOSIS — Z800 Family history of malignant neoplasm of digestive organs: Secondary | ICD-10-CM | POA: Diagnosis not present

## 2022-01-08 DIAGNOSIS — Z923 Personal history of irradiation: Secondary | ICD-10-CM | POA: Diagnosis not present

## 2022-01-08 DIAGNOSIS — D490 Neoplasm of unspecified behavior of digestive system: Secondary | ICD-10-CM | POA: Diagnosis not present

## 2022-01-08 DIAGNOSIS — C221 Intrahepatic bile duct carcinoma: Secondary | ICD-10-CM | POA: Diagnosis not present

## 2022-01-08 DIAGNOSIS — Z9049 Acquired absence of other specified parts of digestive tract: Secondary | ICD-10-CM | POA: Diagnosis not present

## 2022-01-08 DIAGNOSIS — R918 Other nonspecific abnormal finding of lung field: Secondary | ICD-10-CM | POA: Diagnosis not present

## 2022-02-06 ENCOUNTER — Ambulatory Visit (INDEPENDENT_AMBULATORY_CARE_PROVIDER_SITE_OTHER): Payer: PPO

## 2022-02-06 VITALS — Ht 74.0 in | Wt 219.0 lb

## 2022-02-06 DIAGNOSIS — Z0000 Encounter for general adult medical examination without abnormal findings: Secondary | ICD-10-CM

## 2022-02-06 DIAGNOSIS — M24562 Contracture, left knee: Secondary | ICD-10-CM | POA: Diagnosis not present

## 2022-02-06 NOTE — Progress Notes (Signed)
Subjective:   Grant Cummings. is a 76 y.o. male who presents for Medicare Annual/Subsequent preventive examination.  I connected with  Riki Altes. on 02/06/22 by a audio enabled telemedicine application and verified that I am speaking with the correct person using two identifiers.  Patient Location: Home  Provider Location: Home Office  I discussed the limitations of evaluation and management by telemedicine. The patient expressed understanding and agreed to proceed.  Review of Systems     Cardiac Risk Factors include: advanced age (>51mn, >>76women);dyslipidemia;male gender;hypertension     Objective:    Today's Vitals   02/06/22 0955  Weight: 219 lb (99.3 kg)  Height: '6\' 2"'$  (1.88 m)   Body mass index is 28.12 kg/m.     02/06/2022   10:49 AM 07/16/2021    3:25 PM 07/04/2021    8:52 AM 01/19/2021    9:33 AM 01/19/2020    8:46 AM 12/17/2018    8:46 AM 08/30/2017   12:57 PM  Advanced Directives  Does Patient Have a Medical Advance Directive? Yes Yes Yes Yes Yes Yes Yes  Type of Advance Directive Living will;Healthcare Power of [place] will [place];Out of facility DNR (pink MOST or yellow form) [place] Does patient want to make changes to medical advance directive? No - Patient declined No - Patient declined  No - Patient declined No - Patient declined No - Patient declined No - Patient declined  Copy of [place] Chart? Yes - validated most recent copy scanned in chart (See row information) No - copy requested No - copy requested No - copy requested No - copy requested No - copy requested   Would patient like information on creating a medical advance directive?       No - Patient declined    Current Medications (verified) Outpatient  Encounter Medications as of 02/06/2022  Medication Sig   acetaminophen (TYLENOL) 500 MG tablet Take 500-1,000 mg by mouth every 6 (six) hours as needed (for knee pain.).   amLODipine (NORVASC) 10 MG tablet Take 1 tablet (10 mg total) by mouth daily.   apixaban (ELIQUIS) 5 MG TABS tablet Take 1 tablet (5 mg total) by mouth 2 (two) times daily.   carvedilol (COREG) 12.5 MG tablet Take 1 tablet (12.5 mg total) by mouth 2 (two) times daily with a meal.   cholecalciferol (VITAMIN D) 1000 UNITS tablet Take 1,000 Units by mouth daily.   fluticasone (FLONASE) 50 MCG/ACT nasal spray Place 1-2 sprays into both nostrils daily as needed for allergies or rhinitis.   gabapentin (NEURONTIN) 300 MG capsule Take a 300 mg capsule three times a day for two weeks following surgery.Then take a 300 mg capsule two times a day for two weeks. Then take a 300 mg capsule once a day for two weeks. Then discontinue.   hydrochlorothiazide (HYDRODIURIL) 25 MG tablet Take 1 tablet (25 mg total) by mouth daily.   Liniments (BLUE-EMU SUPER STRENGTH) CREA Apply 1 application. topically 3 (three) times daily as needed (knee pain.).   methocarbamol (ROBAXIN) 500 MG tablet Take 1 tablet (500 mg total) by mouth every 6 (six) hours as needed for muscle spasms.   Multiple Vitamin (MULTIVITAMIN WITH MINERALS) TABS tablet Take 1 tablet by mouth every evening.   naphazoline-pheniramine (ALLERGY EYE) 0.025-0.3 % ophthalmic solution 1-2  drops 4 (four) times daily as needed for eye irritation or allergies.   pantoprazole (PROTONIX) 40 MG tablet Take 1 tablet (40 mg total) by mouth daily.   potassium chloride (KLOR-CON M) 10 MEQ tablet Take 1 tablet (10 mEq total) by mouth daily.   No facility-administered encounter medications on file as of 02/06/2022.    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[place])    Past Surgical History:  Procedure Laterality Date   APPENDECTOMY     CARPAL TUNNEL WITH CUBITAL TUNNEL Right    CHOLECYSTECTOMY     EYE SURGERY Left 2017   lens implant to correct stigmatism / cataract   KNEE SURGERY     left   MELANOMA EXCISION Left 01/17/2020   partial liver removal     x 3   RADIOFREQUENCY ABLATION LIVER TUMOR     with biopsy  - NEG   TOTAL KNEE ARTHROPLASTY Left 07/16/2021   Procedure: TOTAL KNEE ARTHROPLASTY;  Surgeon: Gaynelle Arabian, MD;  Location: [facility];  Service: Orthopedics;  Laterality: Left;   Family History  Problem Relation Age of Onset   Alzheimer's disease Mother    Cancer Mother 40       breast   Breast cancer Mother    Alzheimer's disease Father    Cancer Father        lymph nodes- jaw   Hypertension Father    Heart disease Father        heart attack    Cancer Sister        ovarian   Hypertension Sister    GI problems Sister    Melanoma Daughter 27   Clotting disorder Paternal Grandfather    Social History   Socioeconomic History   Marital status: Married    Spouse name: Patsy   Number of children: 3   Years of education: 13   Highest education level: Some college, no degree  Occupational History   Occupation: retired    Comment: Print production planner   Occupation: Farm  Tobacco Use   Smoking status: Never    Passive exposure: Yes   Smokeless tobacco: Never  Vaping Use   Vaping Use: Never used  Substance and Sexual Activity   Alcohol use: No   Drug use: No   Sexual activity: Yes  Other Topics Concern   Not on file  Social History Narrative   Married and lives at home with his wife. They have 3 adult children who live in the local area. He has 7 grandchildren and 2 foster grandchildren. He picks up 2 of the grandkids from school daily. He is very involved with his church and with  his family. He has considered joining a gym but has not. He does stay busy at home. 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    Social Determinants of Health   Financial Resource Strain: Low Risk  (02/06/2022)   Overall Financial Resource Strain (CARDIA)    Difficulty of Paying Living Expenses: Not hard at all  Food Insecurity: No Food Insecurity (02/06/2022)   Hunger Vital Sign    Worried About Running Out of Food  in the Last Year: Never true    [place] in the Last Year: Never true  Transportation Needs: No Transportation Needs (02/06/2022)   PRAPARE - Hydrologist (Medical): No    Lack of Transportation (Non-Medical): No  Physical Activity: Sufficiently Active (02/06/2022)   Exercise Vital Sign    Days of Exercise per Week: 6 days    Minutes of Exercise per Session: 30 min  Stress: No Stress Concern Present (02/06/2022)   [place]    Feeling of Stress : Not at all  Social Connections: [place] (02/06/2022)   Social Connection and Isolation Panel [NHANES]    Frequency of Communication with Friends and Family: More than three times a week    Frequency of Social Gatherings with Friends and Family: Three times a week    Attends Religious Services: More than 4 times per year    Active Member of Clubs or Organizations: Yes    Attends Music therapist: More than 4 times per year    Marital Status: Married    Tobacco Counseling Counseling given: Not Answered   Clinical Intake:  Pre-visit preparation completed: Yes  Pain : No/denies pain  Diabetes: No  How often do you need to have someone help you when you read instructions, pamphlets, or other written materials from your doctor or pharmacy?: 1 - Never  Diabetic?No   Interpreter Needed?: No  Information entered by :: Denman George LPN   Activities of Daily Living    02/06/2022   10:49  AM 07/16/2021    3:28 PM  In your present state of health, do you have any difficulty performing the following activities:  Hearing? 0   Vision? 0   Difficulty concentrating or making decisions? 0   Walking or climbing stairs? 0   Dressing or bathing? 0   Doing errands, shopping? 0 0  Preparing Food and eating ? N   Using the Toilet? N   In the past six months, have you accidently leaked urine? N   Do you have problems with loss of bowel control? N   Managing your Medications? N   Managing your Finances? N   Housekeeping or managing your Housekeeping? N     Patient Care Team: Dettinger, Fransisca Kaufmann, MD as PCP - General (Family Medicine) Derrill Kay, MD as Consulting Physician (Specialist) Jimmy Footman, Meredith Staggers, MD as Referring Physician (Hematology and Oncology) Jyl Heinz, MD as Referring Physician (Surgery) Sandford Craze, MD as Referring Physician (Dermatology) Leticia Clas, OD (Optometry)  Indicate any recent Medical Services you may have received from other than [facility] providers in the past year (date may be approximate).     Assessment:   This is a routine wellness examination for [place].  Hearing/Vision screen Hearing Screening - Comments:: Denies hearing difficulties  Vision Screening - Comments:: Wears rx glasses - up to date with routine eye exams with Dr. Leticia Clas    Dietary issues and exercise activities discussed: Current Exercise Habits: Home exercise routine, Type of exercise: walking, Time (Minutes): 30, Frequency (Times/Week): 6, Weekly Exercise (Minutes/Week): 180, Intensity: Mild   Goals Addressed             This Visit's Progress    DIET - EAT MORE FRUITS AND VEGETABLES   On track    DIET - [place]   On track     Depression Screen    02/06/2022  10:36 AM 10/24/2021    8:28 AM 08/02/2021   10:24 AM 04/05/2021   10:55 AM 01/19/2021    9:32 AM 10/24/2020   10:23 AM 04/21/2020    8:43 AM  PHQ 2/9 Scores  PHQ - 2 Score 0  0 0 0 0 0 0  PHQ- 9 Score  1         Fall Risk    02/06/2022   10:21 AM 10/24/2021    8:28 AM 08/02/2021   10:24 AM 04/05/2021   10:55 AM 01/19/2021    9:32 AM  Fall Risk   Falls in the past year? 1 0 0 0 0  Number falls in past yr: 0      Injury with Fall? 0      Risk for fall due to : Orthopedic patient      Follow up Falls evaluation completed;Education provided;Falls prevention discussed        FALL RISK PREVENTION PERTAINING TO THE HOME:  Any stairs in or around the home? Yes  If so, are there any without handrails? No  Home free of loose throw rugs in walkways, pet beds, electrical cords, etc? Yes  Adequate lighting in your home to reduce risk of falls? Yes   ASSISTIVE DEVICES UTILIZED TO PREVENT FALLS:  Life alert? No  Use of a cane, walker or w/c? No  Grab bars in the bathroom? Yes  Shower chair or bench in shower? No  Elevated toilet seat or a handicapped toilet? Yes   TIMED UP AND GO:  Was the test performed? No . Telephonic visit   Cognitive Function:    12/09/2016    8:41 AM 05/08/2015    9:26 AM  MMSE - Mini Mental State Exam  Orientation to time 5 5  Orientation to Place 5 5  Registration 3 3  Attention/ Calculation 4 4  Recall 3 3  Language- name 2 objects 2 2  Language- repeat 1 1  Language- follow 3 step command 3 3  Language- read & follow direction 1 1  Write a sentence 1 1  Copy design 1 0  Total score 29 28        02/06/2022   10:50 AM 01/19/2021    8:29 AM 01/19/2020    8:51 AM 12/17/2018    8:51 AM 12/15/2017   10:09 AM  6CIT Screen  What Year? 0 points 0 points 0 points 0 points 0 points  What month? 0 points 0 points 0 points 0 points 0 points  What time? 0 points 0 points 0 points 0 points 0 points  Count back from 20 0 points 0 points 0 points 0 points 0 points  Months in reverse 0 points 0 points 0 points 0 points 0 points  Repeat phrase 0 points 0 points 0 points 0 points 0 points  Total Score 0 points 0 points 0 points 0  points 0 points    Immunizations Immunization History  Administered Date(s) Administered   Fluad Quad(high Dose 65+) 01/21/2020, 12/19/2020   Influenza, High Dose Seasonal PF 12/01/2015, 12/09/2016, 12/15/2017, 01/02/2019   Influenza,inj,Quad PF,6+ Mos 12/21/2013, 11/22/2014   Influenza-Unspecified 02/14/2012, 01/14/2022   Moderna Sars-Covid-2 Vaccination 03/05/2019, 04/05/2019, 01/27/2020   Pneumococcal Conjugate-13 11/22/2014   Pneumococcal Polysaccharide-23 12/06/2012   Td 11/29/2016   Tdap 11/29/2016    TDAP status: Up to date  Flu Vaccine status: Up to date  Pneumococcal vaccine status: Up to date  Covid-19 vaccine status: Information provided on how to  obtain vaccines.   Qualifies for Shingles Vaccine? Yes   Zostavax completed No   Shingrix Completed?: No.    Education has been provided regarding the importance of this vaccine. Patient has been advised to call insurance company to determine out of pocket expense if they have not yet received this vaccine. Advised may also receive vaccine at local pharmacy or Health Dept. Verbalized acceptance and understanding.  Screening Tests Health Maintenance  Topic Date Due   Hepatitis C Screening  Never done   Zoster Vaccines- Shingrix (1 of 2) Never done   COVID-19 Vaccine (4 - 2023-24 season) 10/12/2021   Medicare Annual Wellness (AWV)  02/07/2023   DTaP/Tdap/Td (3 - Td or Tdap) 11/30/2026   COLONOSCOPY (Pts 45-64yr Insurance coverage will need to be confirmed)  05/26/2031   Pneumonia Vaccine 75 Years old  Completed   INFLUENZA VACCINE  Completed   HPV VACCINES  Aged Out    Health Maintenance  Health Maintenance Due  Topic Date Due   Hepatitis C Screening  Never done   Zoster Vaccines- Shingrix (1 of 2) Never done   COVID-19 Vaccine (4 - 2023-24 season) 10/12/2021    Colorectal cancer screening: Type of screening: Colonoscopy. Completed 05/25/21. Repeat every 10 years  Lung Cancer Screening: (Low Dose CT Chest  recommended if Age 971-80years, 30 pack-year currently smoking OR have quit w/in 15years.) does not qualify.   Lung Cancer Screening Referral: n/a  Additional Screening:  Hepatitis C Screening: does qualify; Completed at next office visit   Vision Screening: Recommended annual ophthalmology exams for early detection of glaucoma and other disorders of the eye. Is the patient up to date with their annual eye exam?  Yes  Who is the provider or what is the name of the office in which the patient attends annual eye exams? Dr. RLeticia Clas If pt is not established with a provider, would they like to be referred to a provider to establish care? No .   Dental Screening: Recommended annual dental exams for proper oral hygiene  Community Resource Referral / Chronic Care Management: CRR required this visit?  No   CCM required this visit?  No      Plan:     I have personally reviewed and noted the following in the patient's chart:   Medical and social history Use of alcohol, tobacco or illicit drugs  Current medications and supplements including opioid prescriptions. Patient is not currently taking opioid prescriptions. Functional ability and status Nutritional status Physical activity Advanced directives List of other physicians Hospitalizations, surgeries, and ER visits in previous 12 months Vitals Screenings to include cognitive, depression, and falls Referrals and appointments  In addition, I have reviewed and discussed with patient certain preventive protocols, quality metrics, and best practice recommendations. A written personalized care plan for preventive services as well as general preventive health recommendations were provided to patient.     SVanetta Mulders [place]  [id number]  Due to this being a virtual visit, the after visit summary with patients personalized plan was offered to patient via mail or my-chart.  Patient would like to access on  my-chart  Nurse Notes: No concerns

## 2022-02-06 NOTE — Patient Instructions (Signed)
[name] , Thank you for taking time to come for your Medicare Wellness Visit. I appreciate your ongoing commitment to your health goals. Please review the following plan we discussed and let me know if I can assist you in the future.   These are the goals we discussed:  Goals      AWV     01/19/2020 AWV Goal: Fall Prevention  Over the next year, patient will decrease their risk for falls by: Using assistive devices, such as a cane or walker, as needed Identifying fall risks within their home and correcting them by: Removing throw rugs Adding handrails to stairs or ramps Removing clutter and keeping a clear pathway throughout the home Increasing light, especially at night Adding shower handles/bars Raising toilet seat Identifying potential personal risk factors for falls: Medication side effects Incontinence/urgency Vestibular dysfunction Hearing loss Musculoskeletal disorders Neurological disorders Orthostatic hypotension  01/19/2020 AWV Goal: Exercise for General Health  Patient will verbalize understanding of the benefits of increased physical activity: Exercising regularly is important. It will improve your overall fitness, flexibility, and endurance. Regular exercise also will improve your overall health. It can help you control your weight, reduce stress, and improve your bone density. Over the next year, patient will increase physical activity as tolerated with a goal of at least 150 minutes of moderate physical activity per week.  You can tell that you are exercising at a moderate intensity if your heart starts beating faster and you start breathing faster but can still hold a conversation. Moderate-intensity exercise ideas include: Walking 1 mile (1.6 km) in about 15 minutes Biking Hiking Golfing Dancing Water aerobics Patient will verbalize understanding of everyday activities that increase physical activity by providing examples like the following: Yard work,  such as: Sales promotion account executive Gardening Washing windows or floors Patient will be able to explain general safety guidelines for exercising:  Before you start a new exercise program, talk with your health care provider. Do not exercise so much that you hurt yourself, feel dizzy, or get very short of breath. Wear comfortable clothes and wear shoes with good support. Drink plenty of water while you exercise to prevent dehydration or heat stroke. Work out until your breathing and your heartbeat get faster.      DIET - EAT MORE FRUITS AND VEGETABLES     DIET - INCREASE WATER INTAKE     DIET - INCREASE WATER INTAKE     Increase physical activity     Reduce sodium intake     Reduce sugar intake to X grams per day     Weight (lb) < 215 lb (97.5 kg)        This is a list of the screening recommended for you and due dates:  Health Maintenance  Topic Date Due   Hepatitis C Screening: USPSTF Recommendation to screen - Ages 52-79 yo.  Never done   Zoster (Shingles) Vaccine (1 of 2) Never done   COVID-19 Vaccine (4 - 2023-24 season) 10/12/2021   Medicare Annual Wellness Visit  02/07/2023   DTaP/Tdap/Td vaccine (3 - Td or Tdap) 11/30/2026   Colon Cancer Screening  05/26/2031   Pneumonia Vaccine  Completed   Flu Shot  Completed   HPV Vaccine  Aged Out    Advanced directives: We have a copy of your advanced directives available in your record should your provider ever need to access them.   Conditions/risks identified:  Aim for 30 minutes of exercise or brisk walking, 6-8 glasses of water, and 5 servings of fruits and vegetables each day.   Next appointment: Follow up in one year for your annual wellness visit.   Preventive Care 7 Years and Older, Male  Preventive care refers to lifestyle choices and visits with your health care provider that can promote health and wellness. What does preventive care  include? A yearly physical exam. This is also called an annual well check. Dental exams once or twice a year. Routine eye exams. Ask your health care provider how often you should have your eyes checked. Personal lifestyle choices, including: Daily care of your teeth and gums. Regular physical activity. Eating a healthy diet. Avoiding tobacco and drug use. Limiting alcohol use. Practicing safe sex. Taking low doses of aspirin every day. Taking vitamin and mineral supplements as recommended by your health care provider. What happens during an annual well check? The services and screenings done by your health care provider during your annual well check will depend on your age, overall health, lifestyle risk factors, and family history of disease. Counseling  Your health care provider may ask you questions about your: Alcohol use. Tobacco use. Drug use. Emotional well-being. Home and relationship well-being. Sexual activity. Eating habits. History of falls. Memory and ability to understand (cognition). Work and work Statistician. Screening  You may have the following tests or measurements: Height, weight, and BMI. Blood pressure. Lipid and cholesterol levels. These may be checked every 5 years, or more frequently if you are over 48 years old. Skin check. Lung cancer screening. You may have this screening every year starting at age 38 if you have a 30-pack-year history of smoking and currently smoke or have quit within the past 15 years. Fecal occult blood test (FOBT) of the stool. You may have this test every year starting at age 4. Flexible sigmoidoscopy or colonoscopy. You may have a sigmoidoscopy every 5 years or a colonoscopy every 10 years starting at age 59. Prostate cancer screening. Recommendations will vary depending on your family history and other risks. Hepatitis C blood test. Hepatitis B blood test. Sexually transmitted disease (STD) testing. Diabetes screening. This  is done by checking your blood sugar (glucose) after you have not eaten for a while (fasting). You may have this done every 1-3 years. Abdominal aortic aneurysm (AAA) screening. You may need this if you are a current or former smoker. Osteoporosis. You may be screened starting at age 1 if you are at high risk. Talk with your health care provider about your test results, treatment options, and if necessary, the need for more tests. Vaccines  Your health care provider may recommend certain vaccines, such as: Influenza vaccine. This is recommended every year. Tetanus, diphtheria, and acellular pertussis (Tdap, Td) vaccine. You may need a Td booster every 10 years. Zoster vaccine. You may need this after age 45. Pneumococcal 13-valent conjugate (PCV13) vaccine. One dose is recommended after age 80. Pneumococcal polysaccharide (PPSV23) vaccine. One dose is recommended after age 66. Talk to your health care provider about which screenings and vaccines you need and how often you need them. This information is not intended to replace advice given to you by your health care provider. Make sure you discuss any questions you have with your health care provider. Document Released: 02/24/2015 Document Revised: 10/18/2015 Document Reviewed: 11/29/2014 Elsevier Interactive Patient Education  2017 [place] Prevention in the Home Falls can cause injuries. They can happen to  people of all ages. There are many things you can do to make your home safe and to help prevent falls. What can I do on the outside of my home? Regularly fix the edges of walkways and driveways and fix any cracks. Remove anything that might make you trip as you walk through a door, such as a raised step or threshold. Trim any bushes or trees on the path to your home. Use bright outdoor lighting. Clear any walking paths of anything that might make someone trip, such as rocks or tools. Regularly check to see if handrails are  loose or broken. Make sure that both sides of any steps have handrails. Any raised decks and porches should have guardrails on the edges. Have any leaves, snow, or ice cleared regularly. Use sand or salt on walking paths during winter. Clean up any spills in your garage right away. This includes oil or grease spills. What can I do in the bathroom? Use night lights. Install grab bars by the toilet and in the tub and shower. Do not use towel bars as grab bars. Use non-skid mats or decals in the tub or shower. If you need to sit down in the shower, use a plastic, non-slip stool. Keep the floor dry. Clean up any water that spills on the floor as soon as it happens. Remove soap buildup in the tub or shower regularly. Attach bath mats securely with double-sided non-slip rug tape. Do not have throw rugs and other things on the floor that can make you trip. What can I do in the bedroom? Use night lights. Make sure that you have a light by your bed that is easy to reach. Do not use any sheets or blankets that are too big for your bed. They should not hang down onto the floor. Have a firm chair that has side arms. You can use this for support while you get dressed. Do not have throw rugs and other things on the floor that can make you trip. What can I do in the kitchen? Clean up any spills right away. Avoid walking on wet floors. Keep items that you use a lot in easy-to-reach places. If you need to reach something above you, use a strong step stool that has a grab bar. Keep electrical cords out of the way. Do not use floor polish or wax that makes floors slippery. If you must use wax, use non-skid floor wax. Do not have throw rugs and other things on the floor that can make you trip. What can I do with my stairs? Do not leave any items on the stairs. Make sure that there are handrails on both sides of the stairs and use them. Fix handrails that are broken or loose. Make sure that handrails are as  long as the stairways. Check any carpeting to make sure that it is firmly attached to the stairs. Fix any carpet that is loose or worn. Avoid having throw rugs at the top or bottom of the stairs. If you do have throw rugs, attach them to the floor with carpet tape. Make sure that you have a light switch at the top of the stairs and the bottom of the stairs. If you do not have them, ask someone to add them for you. What else can I do to help prevent falls? Wear shoes that: Do not have high heels. Have rubber bottoms. Are comfortable and fit you well. Are closed at the toe. Do not wear sandals. If you  use a stepladder: Make sure that it is fully opened. Do not climb a closed stepladder. Make sure that both sides of the stepladder are locked into place. Ask someone to hold it for you, if possible. Clearly mark and make sure that you can see: Any grab bars or handrails. First and last steps. Where the edge of each step is. Use tools that help you move around (mobility aids) if they are needed. These include: Canes. Walkers. Scooters. Crutches. Turn on the lights when you go into a dark area. Replace any light bulbs as soon as they burn out. Set up your furniture so you have a clear path. Avoid moving your furniture around. If any of your floors are uneven, fix them. If there are any pets around you, be aware of where they are. Review your medicines with your doctor. Some medicines can make you feel dizzy. This can increase your chance of falling. Ask your doctor what other things that you can do to help prevent falls. This information is not intended to replace advice given to you by your health care provider. Make sure you discuss any questions you have with your health care provider. Document Released: 11/24/2008 Document Revised: 07/06/2015 Document Reviewed: 03/04/2014 Elsevier Interactive Patient Education  2017 [employer].

## 2022-02-13 DIAGNOSIS — L570 Actinic keratosis: Secondary | ICD-10-CM | POA: Diagnosis not present

## 2022-02-13 DIAGNOSIS — D485 Neoplasm of uncertain behavior of skin: Secondary | ICD-10-CM | POA: Diagnosis not present

## 2022-02-21 DIAGNOSIS — C44329 Squamous cell carcinoma of skin of other parts of face: Secondary | ICD-10-CM | POA: Diagnosis not present

## 2022-02-28 DIAGNOSIS — Z96652 Presence of left artificial knee joint: Secondary | ICD-10-CM | POA: Diagnosis not present

## 2022-02-28 DIAGNOSIS — Z5189 Encounter for other specified aftercare: Secondary | ICD-10-CM | POA: Diagnosis not present

## 2022-02-28 DIAGNOSIS — M7022 Olecranon bursitis, left elbow: Secondary | ICD-10-CM | POA: Diagnosis not present

## 2022-03-09 DIAGNOSIS — M24562 Contracture, left knee: Secondary | ICD-10-CM | POA: Diagnosis not present

## 2022-04-09 DIAGNOSIS — M24562 Contracture, left knee: Secondary | ICD-10-CM | POA: Diagnosis not present

## 2022-04-25 ENCOUNTER — Ambulatory Visit (INDEPENDENT_AMBULATORY_CARE_PROVIDER_SITE_OTHER): Payer: PPO | Admitting: Family Medicine

## 2022-04-25 ENCOUNTER — Encounter: Payer: Self-pay | Admitting: Family Medicine

## 2022-04-25 VITALS — BP 155/80 | HR 60 | Ht 74.0 in | Wt 219.0 lb

## 2022-04-25 DIAGNOSIS — R0602 Shortness of breath: Secondary | ICD-10-CM

## 2022-04-25 DIAGNOSIS — E782 Mixed hyperlipidemia: Secondary | ICD-10-CM

## 2022-04-25 DIAGNOSIS — I10 Essential (primary) hypertension: Secondary | ICD-10-CM

## 2022-04-25 DIAGNOSIS — N400 Enlarged prostate without lower urinary tract symptoms: Secondary | ICD-10-CM | POA: Diagnosis not present

## 2022-04-25 LAB — CBC WITH DIFFERENTIAL/PLATELET
Basophils Absolute: 0 10*3/uL (ref 0.0–0.2)
Basos: 0 %
EOS (ABSOLUTE): 0.1 10*3/uL (ref 0.0–0.4)
Eos: 2 %
Hematocrit: 41.2 % (ref 37.5–51.0)
Hemoglobin: 14.1 g/dL (ref 13.0–17.7)
Immature Grans (Abs): 0 10*3/uL (ref 0.0–0.1)
Immature Granulocytes: 0 %
Lymphocytes Absolute: 0.7 10*3/uL (ref 0.7–3.1)
Lymphs: 13 %
MCH: 32.1 pg (ref 26.6–33.0)
MCHC: 34.2 g/dL (ref 31.5–35.7)
MCV: 94 fL (ref 79–97)
Monocytes Absolute: 0.5 10*3/uL (ref 0.1–0.9)
Monocytes: 11 %
Neutrophils Absolute: 3.6 10*3/uL (ref 1.4–7.0)
Neutrophils: 74 %
Platelets: 176 10*3/uL (ref 150–450)
RBC: 4.39 x10E6/uL (ref 4.14–5.80)
RDW: 13.2 % (ref 11.6–15.4)
WBC: 4.9 10*3/uL (ref 3.4–10.8)

## 2022-04-25 LAB — CMP14+EGFR
ALT: 26 IU/L (ref 0–44)
AST: 26 IU/L (ref 0–40)
Albumin/Globulin Ratio: 1.9 (ref 1.2–2.2)
Albumin: 4.4 g/dL (ref 3.8–4.8)
Alkaline Phosphatase: 65 IU/L (ref 44–121)
BUN/Creatinine Ratio: 17 (ref 10–24)
BUN: 20 mg/dL (ref 8–27)
Bilirubin Total: 0.6 mg/dL (ref 0.0–1.2)
CO2: 23 mmol/L (ref 20–29)
Calcium: 9.4 mg/dL (ref 8.6–10.2)
Chloride: 103 mmol/L (ref 96–106)
Creatinine, Ser: 1.16 mg/dL (ref 0.76–1.27)
Globulin, Total: 2.3 g/dL (ref 1.5–4.5)
Glucose: 119 mg/dL — ABNORMAL HIGH (ref 70–99)
Potassium: 4.1 mmol/L (ref 3.5–5.2)
Sodium: 141 mmol/L (ref 134–144)
Total Protein: 6.7 g/dL (ref 6.0–8.5)
eGFR: 65 mL/min/{1.73_m2} (ref >59)

## 2022-04-25 LAB — LIPID PANEL
Chol/HDL Ratio: 5.2 ratio — ABNORMAL HIGH (ref 0.0–5.0)
Cholesterol, Total: 196 mg/dL (ref 100–199)
HDL: 38 mg/dL — ABNORMAL LOW (ref >39)
LDL Chol Calc (NIH): 127 mg/dL — ABNORMAL HIGH (ref 0–99)
Triglycerides: 175 mg/dL — ABNORMAL HIGH (ref 0–149)
VLDL Cholesterol Cal: 31 mg/dL (ref 5–40)

## 2022-04-25 NOTE — Progress Notes (Signed)
BP (!) 155/80   Pulse 60   Ht '6\' 2"'$  (1.88 m)   Wt 219 lb (99.3 kg)   SpO2 98%   BMI 28.12 kg/m    Subjective:   Patient ID: Grant Altes., male    DOB: 11-19-1945, 77 y.o.   MRN: [id number]  HPI: Grant Polyak. is a 77 y.o. male presenting on 04/25/2022 for Medical Management of Chronic Issues, Hypertension, and Shortness of Breath   HPI Hypertension Patient is currently on amlodipine and thiazide, and their blood pressure today is 155/80 142/76.  He admits he did not take his medicine this morning and he has been checking it at home and it is running consistently in the 130s at home.. Patient denies any lightheadedness or dizziness. Patient denies headaches, blurred vision, chest pains, shortness of breath, or weakness. Denies any side effects from medication and is content with current medication.   Patient's biggest complaint today is that he gets somewhat short of breath or winded when he is up and moving around and doing extensive work.  He says it has been more gradual that they have noticed.  They do admit that with his knee issues that he had over the past year he was sitting a lot more and a lot less active.  He denies any chest pain or palpitations or sweats with it.  Hyperlipidemia Patient is coming in for recheck of his hyperlipidemia. The patient is currently taking the medicine currently, trying diet. They deny any issues with myalgias or history of liver damage from it. They deny any focal numbness or weakness or chest pain.   BPH Patient is coming in for recheck on BPH Symptoms: None currently Medication: None Last PSA: 6 months ago, normal  Relevant past medical, surgical, family and social history reviewed and updated as indicated. Interim medical history since our last visit reviewed. Allergies and medications reviewed and updated.  Review of Systems  Constitutional:  Negative for chills and fever.  Eyes:  Negative for visual disturbance.   Respiratory:  Negative for shortness of breath and wheezing.   Cardiovascular:  Negative for chest pain and leg swelling.  Musculoskeletal:  Negative for back pain and gait problem.  Skin:  Negative for rash.  Neurological:  Negative for dizziness, weakness and light-headedness.  All other systems reviewed and are negative.   Per HPI unless specifically indicated above   Allergies as of 04/25/2022       Reactions   Nsaids Other (See Comments)   Stomach bleed from radiation        Medication List        Accurate as of April 25, 2022  9:12 AM. If you have any questions, ask your nurse or doctor.          STOP taking these medications    gabapentin 300 MG capsule Commonly known as: NEURONTIN Stopped by: Fransisca Kaufmann Damaria Stofko, MD   methocarbamol 500 MG tablet Commonly known as: ROBAXIN Stopped by: Worthy Rancher, MD       TAKE these medications    acetaminophen 500 MG tablet Commonly known as: TYLENOL Take 500-1,000 mg by mouth every 6 (six) hours as needed (for knee pain.).   Allergy Eye 0.025-0.3 % ophthalmic solution Generic drug: naphazoline-pheniramine 1-2 drops 4 (four) times daily as needed for eye irritation or allergies.   amLODipine 10 MG tablet Commonly known as: NORVASC Take 1 tablet (10 mg total) by mouth daily.   apixaban 5 MG  Tabs tablet Commonly known as: Eliquis Take 1 tablet (5 mg total) by mouth 2 (two) times daily.   Blue-Emu Super Strength Crea Apply 1 application. topically 3 (three) times daily as needed (knee pain.).   carvedilol 12.5 MG tablet Commonly known as: COREG Take 1 tablet (12.5 mg total) by mouth 2 (two) times daily with a meal.   cholecalciferol 1000 units tablet Commonly known as: VITAMIN D Take 1,000 Units by mouth daily.   fluticasone 50 MCG/ACT nasal spray Commonly known as: FLONASE Place 1-2 sprays into both nostrils daily as needed for allergies or rhinitis.   hydrochlorothiazide 25 MG tablet Commonly  known as: HYDRODIURIL Take 1 tablet (25 mg total) by mouth daily.   multivitamin with minerals Tabs tablet Take 1 tablet by mouth every evening.   pantoprazole 40 MG tablet Commonly known as: PROTONIX Take 1 tablet (40 mg total) by mouth daily.   potassium chloride 10 MEQ tablet Commonly known as: KLOR-CON M Take 1 tablet (10 mEq total) by mouth daily.         Objective:   BP (!) 155/80   Pulse 60   Ht '6\' 2"'$  (1.88 m)   Wt 219 lb (99.3 kg)   SpO2 98%   BMI 28.12 kg/m   Wt Readings from Last 3 Encounters:  04/25/22 219 lb (99.3 kg)  02/06/22 219 lb (99.3 kg)  10/24/21 219 lb (99.3 kg)    Physical Exam Vitals and nursing note reviewed.  Constitutional:      General: He is not in acute distress.    Appearance: He is well-developed. He is not diaphoretic.  Eyes:     General: No scleral icterus.       Right eye: No discharge.     Conjunctiva/sclera: Conjunctivae normal.     Pupils: Pupils are equal, round, and reactive to light.  Neck:     Thyroid: No thyromegaly.  Cardiovascular:     Rate and Rhythm: Normal rate and regular rhythm.     Heart sounds: Normal heart sounds. No murmur heard. Pulmonary:     Effort: Pulmonary effort is normal. No respiratory distress.     Breath sounds: Normal breath sounds. No wheezing.  Musculoskeletal:        General: Normal range of motion.     Cervical back: Neck supple.  Lymphadenopathy:     Cervical: No cervical adenopathy.  Skin:    General: Skin is warm and dry.     Findings: No rash.  Neurological:     Mental Status: He is alert and oriented to person, place, and time.     Coordination: Coordination normal.  Psychiatric:        Behavior: Behavior normal.       Assessment & Plan:   Problem List Items Addressed This Visit       Cardiovascular and Mediastinum   Hypertension   Relevant Orders   CBC with Differential/Platelet   CMP14+EGFR   Lipid panel     Genitourinary   BPH (benign prostatic hyperplasia)      Other   Hyperlipidemia - Primary   Relevant Orders   CBC with Differential/Platelet   CMP14+EGFR   Lipid panel   Other Visit Diagnoses     Exertional shortness of breath         We discussed the possibility that the exertional shortness of breath could be deconditioning versus possibly a cardiac issue, he is not having any other symptoms of cardiac issues.  We discussed possibly  going to see cardiologist now versus trying to get his conditioning back up now that his knees are doing better.  He has opted for waiting and trying to be active on his own and we will discuss cardiology in the future.  Gave red flag warning symptoms what to watch out for if he were to need to come in more urgently  Otherwise continue current medicine.  Continue to monitor blood pressure at home.  Follow up plan: Return in about 6 months (around 10/26/2022), or if symptoms worsen or fail to improve, for Physical exam hypertension and hyperlipidemia and BPH.  Counseling provided for all of the vaccine components Orders Placed This Encounter  Procedures   CBC with Differential/Platelet   CMP14+EGFR   Lipid panel    Caryl Pina, MD [facility] 04/25/2022, 9:12 AM

## 2022-05-08 DIAGNOSIS — M24562 Contracture, left knee: Secondary | ICD-10-CM | POA: Diagnosis not present

## 2022-06-08 DIAGNOSIS — M24562 Contracture, left knee: Secondary | ICD-10-CM | POA: Diagnosis not present

## 2022-06-25 ENCOUNTER — Encounter: Payer: Self-pay | Admitting: Family Medicine

## 2022-06-25 DIAGNOSIS — I10 Essential (primary) hypertension: Secondary | ICD-10-CM

## 2022-06-25 DIAGNOSIS — K219 Gastro-esophageal reflux disease without esophagitis: Secondary | ICD-10-CM

## 2022-06-25 MED ORDER — APIXABAN 5 MG PO TABS: 5.0000 mg | ORAL_TABLET | Freq: Two times a day (BID) | ORAL | 3 refills | Status: DC

## 2022-06-25 MED ORDER — AMLODIPINE BESYLATE 10 MG PO TABS
10.0000 mg | ORAL_TABLET | Freq: Every day | ORAL | 3 refills | Status: DC
Start: 2022-06-25 — End: 2023-06-05

## 2022-06-25 MED ORDER — CARVEDILOL 12.5 MG PO TABS: 12.5000 mg | ORAL_TABLET | Freq: Two times a day (BID) | ORAL | 3 refills | Status: DC

## 2022-06-25 MED ORDER — PANTOPRAZOLE SODIUM 40 MG PO TBEC
40.0000 mg | DELAYED_RELEASE_TABLET | Freq: Every day | ORAL | 3 refills | Status: DC
Start: 2022-06-25 — End: 2023-06-19

## 2022-06-25 MED ORDER — FLUTICASONE PROPIONATE 50 MCG/ACT NA SUSP: 1.0000 | Freq: Every day | NASAL | 2 refills | Status: DC | PRN

## 2022-06-25 MED ORDER — HYDROCHLOROTHIAZIDE 25 MG PO TABS
25.0000 mg | ORAL_TABLET | Freq: Every day | ORAL | 3 refills | Status: DC
Start: 2022-06-25 — End: 2023-06-19

## 2022-06-25 MED ORDER — POTASSIUM CHLORIDE CRYS ER 10 MEQ PO TBCR
10.0000 meq | EXTENDED_RELEASE_TABLET | Freq: Every day | ORAL | 3 refills | Status: DC
Start: 2022-06-25 — End: 2023-06-19

## 2022-07-25 DIAGNOSIS — C44319 Basal cell carcinoma of skin of other parts of face: Secondary | ICD-10-CM | POA: Diagnosis not present

## 2022-07-25 DIAGNOSIS — L821 Other seborrheic keratosis: Secondary | ICD-10-CM | POA: Diagnosis not present

## 2022-07-25 DIAGNOSIS — Z08 Encounter for follow-up examination after completed treatment for malignant neoplasm: Secondary | ICD-10-CM | POA: Diagnosis not present

## 2022-07-25 DIAGNOSIS — D1801 Hemangioma of skin and subcutaneous tissue: Secondary | ICD-10-CM | POA: Diagnosis not present

## 2022-07-25 DIAGNOSIS — Z808 Family history of malignant neoplasm of other organs or systems: Secondary | ICD-10-CM | POA: Diagnosis not present

## 2022-07-25 DIAGNOSIS — D225 Melanocytic nevi of trunk: Secondary | ICD-10-CM | POA: Diagnosis not present

## 2022-07-25 DIAGNOSIS — Z85828 Personal history of other malignant neoplasm of skin: Secondary | ICD-10-CM | POA: Diagnosis not present

## 2022-07-25 DIAGNOSIS — L814 Other melanin hyperpigmentation: Secondary | ICD-10-CM | POA: Diagnosis not present

## 2022-07-25 DIAGNOSIS — Z7189 Other specified counseling: Secondary | ICD-10-CM | POA: Diagnosis not present

## 2022-07-25 DIAGNOSIS — D2371 Other benign neoplasm of skin of right lower limb, including hip: Secondary | ICD-10-CM | POA: Diagnosis not present

## 2022-07-25 DIAGNOSIS — Z85820 Personal history of malignant melanoma of skin: Secondary | ICD-10-CM | POA: Diagnosis not present

## 2022-07-25 DIAGNOSIS — D485 Neoplasm of uncertain behavior of skin: Secondary | ICD-10-CM | POA: Diagnosis not present

## 2022-07-25 DIAGNOSIS — L989 Disorder of the skin and subcutaneous tissue, unspecified: Secondary | ICD-10-CM | POA: Diagnosis not present

## 2022-07-25 DIAGNOSIS — C44329 Squamous cell carcinoma of skin of other parts of face: Secondary | ICD-10-CM | POA: Diagnosis not present

## 2022-08-14 DIAGNOSIS — H1045 Other chronic allergic conjunctivitis: Secondary | ICD-10-CM | POA: Diagnosis not present

## 2022-08-27 DIAGNOSIS — C44329 Squamous cell carcinoma of skin of other parts of face: Secondary | ICD-10-CM | POA: Diagnosis not present

## 2022-09-03 DIAGNOSIS — Z48817 Encounter for surgical aftercare following surgery on the skin and subcutaneous tissue: Secondary | ICD-10-CM | POA: Diagnosis not present

## 2022-09-03 DIAGNOSIS — L905 Scar conditions and fibrosis of skin: Secondary | ICD-10-CM | POA: Diagnosis not present

## 2022-09-03 DIAGNOSIS — L570 Actinic keratosis: Secondary | ICD-10-CM | POA: Diagnosis not present

## 2022-09-03 DIAGNOSIS — C44619 Basal cell carcinoma of skin of left upper limb, including shoulder: Secondary | ICD-10-CM | POA: Diagnosis not present

## 2022-09-17 DIAGNOSIS — Z4802 Encounter for removal of sutures: Secondary | ICD-10-CM | POA: Diagnosis not present

## 2022-09-17 DIAGNOSIS — C44319 Basal cell carcinoma of skin of other parts of face: Secondary | ICD-10-CM | POA: Diagnosis not present

## 2022-09-24 DIAGNOSIS — L905 Scar conditions and fibrosis of skin: Secondary | ICD-10-CM | POA: Diagnosis not present

## 2022-09-24 DIAGNOSIS — I872 Venous insufficiency (chronic) (peripheral): Secondary | ICD-10-CM | POA: Diagnosis not present

## 2022-09-24 DIAGNOSIS — D485 Neoplasm of uncertain behavior of skin: Secondary | ICD-10-CM | POA: Diagnosis not present

## 2022-09-24 DIAGNOSIS — Z48817 Encounter for surgical aftercare following surgery on the skin and subcutaneous tissue: Secondary | ICD-10-CM | POA: Diagnosis not present

## 2022-10-22 DIAGNOSIS — L814 Other melanin hyperpigmentation: Secondary | ICD-10-CM | POA: Diagnosis not present

## 2022-10-22 DIAGNOSIS — C44329 Squamous cell carcinoma of skin of other parts of face: Secondary | ICD-10-CM | POA: Diagnosis not present

## 2022-10-22 DIAGNOSIS — D485 Neoplasm of uncertain behavior of skin: Secondary | ICD-10-CM | POA: Diagnosis not present

## 2022-10-22 DIAGNOSIS — D1801 Hemangioma of skin and subcutaneous tissue: Secondary | ICD-10-CM | POA: Diagnosis not present

## 2022-10-22 DIAGNOSIS — Z48817 Encounter for surgical aftercare following surgery on the skin and subcutaneous tissue: Secondary | ICD-10-CM | POA: Diagnosis not present

## 2022-10-22 DIAGNOSIS — L821 Other seborrheic keratosis: Secondary | ICD-10-CM | POA: Diagnosis not present

## 2022-10-22 DIAGNOSIS — Z85828 Personal history of other malignant neoplasm of skin: Secondary | ICD-10-CM | POA: Diagnosis not present

## 2022-10-22 DIAGNOSIS — Z7189 Other specified counseling: Secondary | ICD-10-CM | POA: Diagnosis not present

## 2022-10-22 DIAGNOSIS — Z808 Family history of malignant neoplasm of other organs or systems: Secondary | ICD-10-CM | POA: Diagnosis not present

## 2022-10-22 DIAGNOSIS — D225 Melanocytic nevi of trunk: Secondary | ICD-10-CM | POA: Diagnosis not present

## 2022-10-22 DIAGNOSIS — Z85820 Personal history of malignant melanoma of skin: Secondary | ICD-10-CM | POA: Diagnosis not present

## 2022-10-22 DIAGNOSIS — Z08 Encounter for follow-up examination after completed treatment for malignant neoplasm: Secondary | ICD-10-CM | POA: Diagnosis not present

## 2022-10-28 ENCOUNTER — Ambulatory Visit: Payer: PPO | Admitting: Family Medicine

## 2022-10-30 ENCOUNTER — Encounter: Payer: PPO | Admitting: Family Medicine

## 2022-10-31 ENCOUNTER — Encounter: Payer: PPO | Admitting: Family Medicine

## 2022-11-12 DIAGNOSIS — C44329 Squamous cell carcinoma of skin of other parts of face: Secondary | ICD-10-CM | POA: Diagnosis not present

## 2022-11-26 DIAGNOSIS — D0439 Carcinoma in situ of skin of other parts of face: Secondary | ICD-10-CM | POA: Diagnosis not present

## 2022-12-05 ENCOUNTER — Ambulatory Visit: Payer: PPO | Admitting: Family Medicine

## 2022-12-05 ENCOUNTER — Ambulatory Visit (INDEPENDENT_AMBULATORY_CARE_PROVIDER_SITE_OTHER): Payer: PPO

## 2022-12-05 ENCOUNTER — Encounter: Payer: Self-pay | Admitting: Family Medicine

## 2022-12-05 VITALS — BP 145/77 | HR 62 | Temp 98.2°F | Ht 74.0 in | Wt 231.0 lb

## 2022-12-05 DIAGNOSIS — I10 Essential (primary) hypertension: Secondary | ICD-10-CM

## 2022-12-05 DIAGNOSIS — I700 Atherosclerosis of aorta: Secondary | ICD-10-CM | POA: Diagnosis not present

## 2022-12-05 DIAGNOSIS — Z7901 Long term (current) use of anticoagulants: Secondary | ICD-10-CM | POA: Diagnosis not present

## 2022-12-05 DIAGNOSIS — S299XXA Unspecified injury of thorax, initial encounter: Secondary | ICD-10-CM

## 2022-12-05 DIAGNOSIS — S20212A Contusion of left front wall of thorax, initial encounter: Secondary | ICD-10-CM | POA: Diagnosis not present

## 2022-12-05 NOTE — Progress Notes (Signed)
No fracture present. Recommend patient continue current plan. Follow up if swelling is increasing, pain is not improving or other acute issues occur.

## 2022-12-05 NOTE — Progress Notes (Addendum)
Subjective:  Patient ID: Grant Lamp., male    DOB: Apr 28, 1945, 77 y.o.   MRN: [id number]  Patient Care Team: Dettinger, Elige Radon, MD as PCP - General (Family Medicine) Alinda Dooms, MD as Consulting Physician (Specialist) Angelina Sheriff, Michail Jewels, MD as Referring Physician (Hematology and Oncology) Deveron Furlong, MD as Referring Physician (Surgery) Marcelino Duster, MD as Referring Physician (Dermatology) Dr [facility], OD (Optometry)   Chief Complaint:  left side rib pain   HPI: Grant Dobos. is a 77 y.o. male presenting on 12/05/2022 for left side rib pain  States that he was unloading hay off of a trailer on Saturday and he was hit by a bale of hay (700 lbs), hit him on shoulder, ribs. Hit the ground and has abrasion on right elbow and knees. Taking tylenol as he cannot take NSAIDs due to GIB history. Only taking at night. Wife reports some swelling at the site. States that he cannot take a deep breath and feels terrible if he has to sneeze. Sitting pain is a 7-8/10. Cannot bend and cannot put pressure on side. He is on a blood thinner, but does not believe that he hit his head.   Relevant past medical, surgical, family, and social history reviewed and updated as indicated.  Allergies and medications reviewed and updated. Data reviewed: Chart in Epic.   Past Medical History:  Diagnosis Date   Anemia    Arthritis    Asbestos exposure    Cataract    GERD (gastroesophageal reflux disease)    Hyperlipidemia    Hypertension    Liver cancer (HCC)    cholangeocarcinoma   PE (pulmonary thromboembolism) (HCC)    Saddle pulmonary embolus (HCC)     Past Surgical History:  Procedure Laterality Date   APPENDECTOMY     CARPAL TUNNEL WITH CUBITAL TUNNEL Right    CHOLECYSTECTOMY     EYE SURGERY Left 2017   lens implant to correct stigmatism / cataract   KNEE SURGERY     left   MELANOMA EXCISION Left 01/17/2020   partial liver removal     x 3    RADIOFREQUENCY ABLATION LIVER TUMOR     with biopsy  - NEG   TOTAL KNEE ARTHROPLASTY Left 07/16/2021   Procedure: TOTAL KNEE ARTHROPLASTY;  Surgeon: Ollen Gross, MD;  Location: [facility];  Service: Orthopedics;  Laterality: Left;    Social History   Socioeconomic History   Marital status: Married    Spouse name: Patsy   Number of children: 3   Years of education: 13   Highest education level: Some college, no degree  Occupational History   Occupation: retired    Comment: Health and safety inspector   Occupation: Farm  Tobacco Use   Smoking status: Never    Passive exposure: Yes   Smokeless tobacco: Never  Vaping Use   Vaping status: Never Used  Substance and Sexual Activity   Alcohol use: No   Drug use: No   Sexual activity: Yes  Other Topics Concern   Not on file  Social History Narrative   Married and lives at home with his wife. They have 3 adult children who live in the local area. He has 7 grandchildren and 2 foster grandchildren. He picks up 2 of the grandkids from school daily. He is very involved with his church and with his family. He has considered joining a gym but has not. He does stay busy at home.  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    Social Determinants of Health   Financial Resource Strain: Low Risk  (02/06/2022)   Overall Financial Resource Strain (CARDIA)    Difficulty of Paying Living Expenses: Not hard at all  Food Insecurity: No Food Insecurity (02/06/2022)   Hunger Vital Sign    Worried About Running Out of Food in the Last Year: Never true    Ran Out of Food in the Last Year: Never true  Transportation Needs: No Transportation Needs (02/06/2022)   PRAPARE - Administrator, Civil Service (Medical): No    Lack of Transportation (Non-Medical): No  Physical Activity: Sufficiently Active (02/06/2022)   Exercise Vital Sign    Days of Exercise per Week: 6 days    Minutes of Exercise per Session: 30 min  Stress: No Stress Concern Present  (02/06/2022)   [employer] of Occupational Health - Occupational Stress Questionnaire    Feeling of Stress : Not at all  Social Connections: Socially Integrated (02/06/2022)   Social Connection and Isolation Panel [NHANES]    Frequency of Communication with Friends and Family: More than three times a week    Frequency of Social Gatherings with Friends and Family: Three times a week    Attends Religious Services: More than 4 times per year    Active Member of Clubs or Organizations: Yes    Attends Banker Meetings: More than 4 times per year    Marital Status: Married  Catering manager Violence: Not At Risk (02/06/2022)   Humiliation, Afraid, Rape, and Kick questionnaire    Fear of Current or Ex-Partner: No    Emotionally Abused: No    Physically Abused: No    Sexually Abused: No    Outpatient Encounter Medications as of 12/05/2022  Medication Sig   acetaminophen (TYLENOL) 500 MG tablet Take 500-1,000 mg by mouth every 6 (six) hours as needed (for knee pain.).   amLODipine (NORVASC) 10 MG tablet Take 1 tablet (10 mg total) by mouth daily.   apixaban (ELIQUIS) 5 MG TABS tablet Take 1 tablet (5 mg total) by mouth 2 (two) times daily.   carvedilol (COREG) 12.5 MG tablet Take 1 tablet (12.5 mg total) by mouth 2 (two) times daily with a meal.   cholecalciferol (VITAMIN D) 1000 UNITS tablet Take 1,000 Units by mouth daily.   fluticasone (FLONASE) 50 MCG/ACT nasal spray Place 1-2 sprays into both nostrils daily as needed for allergies or rhinitis.   hydrochlorothiazide (HYDRODIURIL) 25 MG tablet Take 1 tablet (25 mg total) by mouth daily.   Liniments (BLUE-EMU SUPER STRENGTH) CREA Apply 1 application. topically 3 (three) times daily as needed (knee pain.).   Multiple Vitamin (MULTIVITAMIN WITH MINERALS) TABS tablet Take 1 tablet by mouth every evening.   naphazoline-pheniramine (ALLERGY EYE) 0.025-0.3 % ophthalmic solution 1-2 drops 4 (four) times daily as needed for eye  irritation or allergies.   pantoprazole (PROTONIX) 40 MG tablet Take 1 tablet (40 mg total) by mouth daily.   potassium chloride (KLOR-CON M) 10 MEQ tablet Take 1 tablet (10 mEq total) by mouth daily.   No facility-administered encounter medications on file as of 12/05/2022.    Allergies  Allergen Reactions   Nsaids Other (See Comments)    Stomach bleed from radiation    Review of Systems As per HPI Objective:  BP (!) 145/79   Pulse 62   Temp 98.2 F (36.8 C)   Ht 6\' 2"  (1.88  m)   Wt 231 lb (104.8 kg)   SpO2 98%   BMI 29.66 kg/m    Wt Readings from Last 3 Encounters:  12/05/22 231 lb (104.8 kg)  04/25/22 219 lb (99.3 kg)  02/06/22 219 lb (99.3 kg)   Physical Exam Constitutional:      General: He is awake. He is not in acute distress.    Appearance: Normal appearance. He is well-developed and well-groomed. He is not ill-appearing, toxic-appearing or diaphoretic.  Cardiovascular:     Rate and Rhythm: Normal rate.     Pulses: Normal pulses.          Radial pulses are 2+ on the right side and 2+ on the left side.       Posterior tibial pulses are 2+ on the right side and 2+ on the left side.     Heart sounds: Normal heart sounds. No murmur heard.    No gallop.  Pulmonary:     Effort: Pulmonary effort is normal. No respiratory distress.     Breath sounds: Normal breath sounds. No stridor. No wheezing, rhonchi or rales.  Abdominal:     General: Bowel sounds are normal.     Palpations: Abdomen is soft.     Tenderness: There is no abdominal tenderness. There is no right CVA tenderness, left CVA tenderness, guarding or rebound. Negative signs include Murphy's sign, Rovsing's sign, McBurney's sign, psoas sign and obturator sign.     Hernia: A hernia is present.       Comments: Small area of swelling on left upper ab/lower rib cage   Musculoskeletal:       Arms:     Cervical back: Full passive range of motion without pain and neck supple.     Right lower leg: No edema.      Left lower leg: No edema.     Comments: Pain to palpation on left lower rib cage   Skin:    General: Skin is warm.     Capillary Refill: Capillary refill takes less than 2 seconds.  Neurological:     General: No focal deficit present.     Mental Status: He is alert, oriented to person, place, and time and easily aroused. Mental status is at baseline.     GCS: GCS eye subscore is 4. GCS verbal subscore is 5. GCS motor subscore is 6.     Motor: No weakness.  Psychiatric:        Attention and Perception: Attention and perception normal.        Mood and Affect: Mood and affect normal.        Speech: Speech normal.        Behavior: Behavior normal. Behavior is cooperative.        Thought Content: Thought content normal. Thought content does not include homicidal or suicidal ideation. Thought content does not include homicidal or suicidal plan.        Cognition and Memory: Cognition and memory normal.        Judgment: Judgment normal.     Results for orders placed or performed in visit on 04/25/22  CBC with Differential/Platelet  Result Value Ref Range   WBC 4.9 3.4 - 10.8 x10E3/uL   RBC 4.39 4.14 - 5.80 x10E6/uL   Hemoglobin 14.1 13.0 - 17.7 g/dL   Hematocrit 29.5 28.4 - 51.0 %   MCV 94 79 - 97 fL   MCH 32.1 26.6 - 33.0 pg   MCHC 34.2 31.5 - 35.7 g/dL  RDW 13.2 11.6 - 15.4 %   Platelets 176 150 - 450 x10E3/uL   Neutrophils 74 Not Estab. %   Lymphs 13 Not Estab. %   Monocytes 11 Not Estab. %   Eos 2 Not Estab. %   Basos 0 Not Estab. %   Neutrophils Absolute 3.6 1.4 - 7.0 x10E3/uL   Lymphocytes Absolute 0.7 0.7 - 3.1 x10E3/uL   Monocytes Absolute 0.5 0.1 - 0.9 x10E3/uL   EOS (ABSOLUTE) 0.1 0.0 - 0.4 x10E3/uL   Basophils Absolute 0.0 0.0 - 0.2 x10E3/uL   Immature Granulocytes 0 Not Estab. %   Immature Grans (Abs) 0.0 0.0 - 0.1 x10E3/uL  CMP14+EGFR  Result Value Ref Range   Glucose 119 (H) 70 - 99 mg/dL   BUN 20 8 - 27 mg/dL   Creatinine, Ser 1.47 0.76 - 1.27 mg/dL    eGFR 65 >82 NF/AOZ/3.08   BUN/Creatinine Ratio 17 10 - 24   Sodium 141 134 - 144 mmol/L   Potassium 4.1 3.5 - 5.2 mmol/L   Chloride 103 96 - 106 mmol/L   CO2 23 20 - 29 mmol/L   Calcium 9.4 8.6 - 10.2 mg/dL   Total Protein 6.7 6.0 - 8.5 g/dL   Albumin 4.4 3.8 - 4.8 g/dL   Globulin, Total 2.3 1.5 - 4.5 g/dL   Albumin/Globulin Ratio 1.9 1.2 - 2.2   Bilirubin Total 0.6 0.0 - 1.2 mg/dL   Alkaline Phosphatase 65 44 - 121 IU/L   AST 26 0 - 40 IU/L   ALT 26 0 - 44 IU/L  Lipid panel  Result Value Ref Range   Cholesterol, Total 196 100 - 199 mg/dL   Triglycerides 657 (H) 0 - 149 mg/dL   HDL 38 (L) >84 mg/dL   VLDL Cholesterol Cal 31 5 - 40 mg/dL   LDL Chol Calc (NIH) 696 (H) 0 - 99 mg/dL   Chol/HDL Ratio 5.2 (H) 0.0 - 5.0 ratio       04/25/2022    8:39 AM 04/25/2022    8:38 AM 02/06/2022   10:36 AM 10/24/2021    8:28 AM 08/02/2021   10:25 AM  Depression screen PHQ 2/9  Decreased Interest  0 0 0   Down, Depressed, Hopeless  0 0 0   PHQ - 2 Score  0 0 0   Altered sleeping 1   0 0  Tired, decreased energy 1   1 1   Change in appetite 0   0 0  Feeling bad or failure about yourself  0   0 0  Trouble concentrating 0   0 0  Moving slowly or fidgety/restless 0   0 0  Suicidal thoughts 0   0 0  PHQ-9 Score    1   Difficult doing work/chores Not difficult at all   Not difficult at all Not difficult at all       04/25/2022    8:38 AM 10/24/2021    8:28 AM 08/02/2021   10:24 AM 04/05/2021   11:11 AM  GAD 7 : Generalized Anxiety Score  Nervous, Anxious, on Edge 1 0 0   Control/stop worrying 1 0 0 0  Worry too much - different things 0 0 0 0  Trouble relaxing 0 0 0 0  Restless 0 0 0 0  Easily annoyed or irritable 1 1 1 1   Afraid - awful might happen 0 0 0 0  Total GAD 7 Score 3 1 1    Anxiety Difficulty Somewhat difficult Not difficult  at all Not difficult at all    Pertinent labs & imaging results that were available during my care of the patient were reviewed by me and considered in  my medical decision making.  Assessment & Plan:  Grant Cummings was seen today for left side rib pain.  Diagnoses and all orders for this visit:  Traumatic injury of rib Imaging as below. Will communicate results to patient once available. Will await results to determine next steps.  -     DG Ribs Unilateral W/Chest Left  Primary hypertension Elevated BP in office today. Patient in pain today. Patient to follow up with PCP for BP management.   Chronic anticoagulation Patient denies head trauma during accident. Patient to monitor for adverse events from injury.    Continue all other maintenance medications.  Follow up plan: Return if symptoms worsen or fail to improve.   Continue healthy lifestyle choices, including diet (rich in fruits, vegetables, and lean proteins, and low in salt and simple carbohydrates) and exercise (at least 30 minutes of moderate physical activity daily).  Written and verbal instructions provided   The above assessment and management plan was discussed with the patient. The patient verbalized understanding of and has agreed to the management plan. Patient is aware to call the clinic if they develop any new symptoms or if symptoms persist or worsen. Patient is aware when to return to the clinic for a follow-up visit. Patient educated on when it is appropriate to go to the emergency department.   Neale Burly, DNP-FNP [facility] [address] [place], [place] [address] [phone]

## 2022-12-18 DIAGNOSIS — C221 Intrahepatic bile duct carcinoma: Secondary | ICD-10-CM | POA: Diagnosis not present

## 2022-12-18 DIAGNOSIS — R911 Solitary pulmonary nodule: Secondary | ICD-10-CM | POA: Diagnosis not present

## 2022-12-18 DIAGNOSIS — Z8505 Personal history of malignant neoplasm of liver: Secondary | ICD-10-CM | POA: Diagnosis not present

## 2022-12-18 DIAGNOSIS — Z7901 Long term (current) use of anticoagulants: Secondary | ICD-10-CM | POA: Diagnosis not present

## 2022-12-18 DIAGNOSIS — Z85820 Personal history of malignant melanoma of skin: Secondary | ICD-10-CM | POA: Diagnosis not present

## 2022-12-18 DIAGNOSIS — Z86018 Personal history of other benign neoplasm: Secondary | ICD-10-CM | POA: Diagnosis not present

## 2022-12-18 DIAGNOSIS — Z9089 Acquired absence of other organs: Secondary | ICD-10-CM | POA: Diagnosis not present

## 2022-12-18 DIAGNOSIS — Z86711 Personal history of pulmonary embolism: Secondary | ICD-10-CM | POA: Diagnosis not present

## 2022-12-18 DIAGNOSIS — D649 Anemia, unspecified: Secondary | ICD-10-CM | POA: Diagnosis not present

## 2022-12-18 DIAGNOSIS — Z9889 Other specified postprocedural states: Secondary | ICD-10-CM | POA: Diagnosis not present

## 2022-12-18 DIAGNOSIS — Z923 Personal history of irradiation: Secondary | ICD-10-CM | POA: Diagnosis not present

## 2022-12-18 DIAGNOSIS — Z9221 Personal history of antineoplastic chemotherapy: Secondary | ICD-10-CM | POA: Diagnosis not present

## 2022-12-18 DIAGNOSIS — R918 Other nonspecific abnormal finding of lung field: Secondary | ICD-10-CM | POA: Diagnosis not present

## 2022-12-18 DIAGNOSIS — K7689 Other specified diseases of liver: Secondary | ICD-10-CM | POA: Diagnosis not present

## 2022-12-18 DIAGNOSIS — K862 Cyst of pancreas: Secondary | ICD-10-CM | POA: Diagnosis not present

## 2022-12-20 ENCOUNTER — Ambulatory Visit (INDEPENDENT_AMBULATORY_CARE_PROVIDER_SITE_OTHER): Payer: PPO | Admitting: Family Medicine

## 2022-12-20 ENCOUNTER — Encounter: Payer: Self-pay | Admitting: Family Medicine

## 2022-12-20 VITALS — BP 148/71 | HR 58 | Temp 98.5°F | Ht 74.0 in | Wt 232.0 lb

## 2022-12-20 DIAGNOSIS — E782 Mixed hyperlipidemia: Secondary | ICD-10-CM | POA: Diagnosis not present

## 2022-12-20 DIAGNOSIS — Z23 Encounter for immunization: Secondary | ICD-10-CM | POA: Diagnosis not present

## 2022-12-20 DIAGNOSIS — Z0001 Encounter for general adult medical examination with abnormal findings: Secondary | ICD-10-CM

## 2022-12-20 DIAGNOSIS — N401 Enlarged prostate with lower urinary tract symptoms: Secondary | ICD-10-CM

## 2022-12-20 DIAGNOSIS — I10 Essential (primary) hypertension: Secondary | ICD-10-CM | POA: Diagnosis not present

## 2022-12-20 DIAGNOSIS — R350 Frequency of micturition: Secondary | ICD-10-CM | POA: Diagnosis not present

## 2022-12-20 DIAGNOSIS — Z0000 Encounter for general adult medical examination without abnormal findings: Secondary | ICD-10-CM

## 2022-12-20 DIAGNOSIS — N400 Enlarged prostate without lower urinary tract symptoms: Secondary | ICD-10-CM | POA: Diagnosis not present

## 2022-12-20 MED ORDER — TAMSULOSIN HCL 0.4 MG PO CAPS: 0.4000 mg | ORAL_CAPSULE | Freq: Every day | ORAL | 3 refills | Status: DC

## 2022-12-20 NOTE — Progress Notes (Signed)
BP (!) 148/71   Pulse (!) 58   Temp 98.5 F (36.9 C)   Ht 6\' 2"  (1.88 m)   Wt 232 lb (105.2 kg)   SpO2 97%   BMI 29.79 kg/m    Subjective:   Patient ID: Grant Lamp., male    DOB: April 12, 1945, 77 y.o.   MRN: [id number]  HPI: Grant Steger. is a 77 y.o. male presenting on 12/20/2022 for Medical Management of Chronic Issues   HPI Physical exam Patient denies any chest pain, shortness of breath, headaches or vision issues, abdominal complaints, diarrhea, nausea, vomiting, or joint issues.   Hypertension Patient is currently on amlodipine and carvedilol and hydrochlorothiazide , and their blood pressure today is 148/71. Patient denies any lightheadedness or dizziness. Patient denies headaches, blurred vision, chest pains, shortness of breath, or weakness. Denies any side effects from medication and is content with current medication.   Hyperlipidemia Patient is coming in for recheck of his hyperlipidemia. The patient is currently taking none currently. They deny any issues with myalgias or history of liver damage from it. They deny any focal numbness or weakness or chest pain.   BPH Patient is coming in for recheck on BPH Symptoms: frequency and nocturia Medication:  none currently Last PSA: 2023   Relevant past medical, surgical, family and social history reviewed and updated as indicated. Interim medical history since our last visit reviewed. Allergies and medications reviewed and updated.  Review of Systems  Constitutional:  Negative for chills and fever.  Eyes:  Negative for visual disturbance.  Respiratory:  Negative for shortness of breath and wheezing.   Cardiovascular:  Negative for chest pain and leg swelling.  Musculoskeletal:  Negative for back pain and gait problem.  Skin:  Negative for rash.  All other systems reviewed and are negative.   Per HPI unless specifically indicated above   Allergies as of 12/20/2022       Reactions   Nsaids Other (See  Comments)   Stomach bleed from radiation        Medication List        Accurate as of December 20, 2022  3:07 PM. If you have any questions, ask your nurse or doctor.          acetaminophen 500 MG tablet Commonly known as: TYLENOL Take 500-1,000 mg by mouth every 6 (six) hours as needed (for knee pain.).   Allergy Eye 0.025-0.3 % ophthalmic solution Generic drug: naphazoline-pheniramine 1-2 drops 4 (four) times daily as needed for eye irritation or allergies.   amLODipine 10 MG tablet Commonly known as: NORVASC Take 1 tablet (10 mg total) by mouth daily.   apixaban 5 MG Tabs tablet Commonly known as: Eliquis Take 1 tablet (5 mg total) by mouth 2 (two) times daily.   Blue-Emu Super Strength Crea Apply 1 application. topically 3 (three) times daily as needed (knee pain.).   carvedilol 12.5 MG tablet Commonly known as: COREG Take 1 tablet (12.5 mg total) by mouth 2 (two) times daily with a meal.   cholecalciferol 1000 units tablet Commonly known as: VITAMIN D Take 1,000 Units by mouth daily.   fluticasone 50 MCG/ACT nasal spray Commonly known as: FLONASE Place 1-2 sprays into both nostrils daily as needed for allergies or rhinitis.   hydrochlorothiazide 25 MG tablet Commonly known as: HYDRODIURIL Take 1 tablet (25 mg total) by mouth daily.   multivitamin with minerals Tabs tablet Take 1 tablet by mouth every evening.  pantoprazole 40 MG tablet Commonly known as: PROTONIX Take 1 tablet (40 mg total) by mouth daily.   potassium chloride 10 MEQ tablet Commonly known as: KLOR-CON M Take 1 tablet (10 mEq total) by mouth daily.         Objective:   BP (!) 148/71   Pulse (!) 58   Temp 98.5 F (36.9 C)   Ht 6\' 2"  (1.88 m)   Wt 232 lb (105.2 kg)   SpO2 97%   BMI 29.79 kg/m   Wt Readings from Last 3 Encounters:  12/20/22 232 lb (105.2 kg)  12/05/22 231 lb (104.8 kg)  04/25/22 219 lb (99.3 kg)    Physical Exam Vitals and nursing note reviewed.   Constitutional:      General: He is not in acute distress.    Appearance: He is well-developed. He is not diaphoretic.  Eyes:     General: No scleral icterus.    Conjunctiva/sclera: Conjunctivae normal.  Neck:     Thyroid: No thyromegaly.  Cardiovascular:     Rate and Rhythm: Normal rate and regular rhythm.     Heart sounds: Normal heart sounds. No murmur heard. Pulmonary:     Effort: Pulmonary effort is normal. No respiratory distress.     Breath sounds: Normal breath sounds. No wheezing.  Musculoskeletal:        General: No swelling. Normal range of motion.     Cervical back: Neck supple.  Lymphadenopathy:     Cervical: No cervical adenopathy.  Skin:    General: Skin is warm and dry.     Findings: No rash.  Neurological:     Mental Status: He is alert and oriented to person, place, and time.     Coordination: Coordination normal.  Psychiatric:        Behavior: Behavior normal.       Assessment & Plan:   Problem List Items Addressed This Visit       Cardiovascular and Mediastinum   Hypertension     Genitourinary   BPH (benign prostatic hyperplasia)   Relevant Orders   PSA, total and free     Other   Hyperlipidemia   Relevant Orders   Lipid panel   Other Visit Diagnoses     Physical exam    -  Primary   Essential hypertension       Relevant Orders   CBC with Differential/Platelet   CMP14+EGFR       Blood pressure and everything looks good.  Will try to add Flomax to see if it helps him.  No other changes Follow up plan: Return in about 6 months (around 06/19/2023), or if symptoms worsen or fail to improve, for Hypertension and cholesterol.  Counseling provided for all of the vaccine components Orders Placed This Encounter  Procedures   CBC with Differential/Platelet   CMP14+EGFR   Lipid panel   PSA, total and free    Arville Care, MD [facility] 12/20/2022, 3:07 PM

## 2022-12-21 LAB — CBC WITH DIFFERENTIAL/PLATELET
Basophils Absolute: 0 10*3/uL (ref 0.0–0.2)
Basos: 0 %
EOS (ABSOLUTE): 0.1 10*3/uL (ref 0.0–0.4)
Eos: 1 %
Hematocrit: 40.9 % (ref 37.5–51.0)
Hemoglobin: 13.3 g/dL (ref 13.0–17.7)
Immature Grans (Abs): 0 10*3/uL (ref 0.0–0.1)
Immature Granulocytes: 0 %
Lymphocytes Absolute: 0.8 10*3/uL (ref 0.7–3.1)
Lymphs: 15 %
MCH: 30.8 pg (ref 26.6–33.0)
MCHC: 32.5 g/dL (ref 31.5–35.7)
MCV: 95 fL (ref 79–97)
Monocytes Absolute: 0.5 10*3/uL (ref 0.1–0.9)
Monocytes: 10 %
Neutrophils Absolute: 3.8 10*3/uL (ref 1.4–7.0)
Neutrophils: 74 %
Platelets: 187 10*3/uL (ref 150–450)
RBC: 4.32 x10E6/uL (ref 4.14–5.80)
RDW: 13.6 % (ref 11.6–15.4)
WBC: 5.2 10*3/uL (ref 3.4–10.8)

## 2022-12-21 LAB — LIPID PANEL
Chol/HDL Ratio: 5.9 ratio — ABNORMAL HIGH (ref 0.0–5.0)
Cholesterol, Total: 212 mg/dL — ABNORMAL HIGH (ref 100–199)
HDL: 36 mg/dL — ABNORMAL LOW (ref >39)
LDL Chol Calc (NIH): 131 mg/dL — ABNORMAL HIGH (ref 0–99)
Triglycerides: 251 mg/dL — ABNORMAL HIGH (ref 0–149)
VLDL Cholesterol Cal: 45 mg/dL — ABNORMAL HIGH (ref 5–40)

## 2022-12-21 LAB — CMP14+EGFR
ALT: 32 [IU]/L (ref 0–44)
AST: 33 [IU]/L (ref 0–40)
Albumin: 4.2 g/dL (ref 3.8–4.8)
Alkaline Phosphatase: 57 [IU]/L (ref 44–121)
BUN/Creatinine Ratio: 16 (ref 10–24)
BUN: 18 mg/dL (ref 8–27)
Bilirubin Total: 0.6 mg/dL (ref 0.0–1.2)
CO2: 21 mmol/L (ref 20–29)
Calcium: 9.3 mg/dL (ref 8.6–10.2)
Chloride: 104 mmol/L (ref 96–106)
Creatinine, Ser: 1.14 mg/dL (ref 0.76–1.27)
Globulin, Total: 2.4 g/dL (ref 1.5–4.5)
Glucose: 90 mg/dL (ref 70–99)
Potassium: 4.4 mmol/L (ref 3.5–5.2)
Sodium: 140 mmol/L (ref 134–144)
Total Protein: 6.6 g/dL (ref 6.0–8.5)
eGFR: 67 mL/min/{1.73_m2} (ref >59)

## 2022-12-21 LAB — PSA, TOTAL AND FREE
PSA, Free Pct: 20 %
PSA, Free: 0.04 ng/mL
Prostate Specific Ag, Serum: 0.2 ng/mL (ref 0.0–4.0)

## 2022-12-23 DIAGNOSIS — H524 Presbyopia: Secondary | ICD-10-CM | POA: Diagnosis not present

## 2022-12-23 DIAGNOSIS — H35033 Hypertensive retinopathy, bilateral: Secondary | ICD-10-CM | POA: Diagnosis not present

## 2022-12-23 LAB — HM DIABETES EYE EXAM

## 2022-12-25 DIAGNOSIS — Z452 Encounter for adjustment and management of vascular access device: Secondary | ICD-10-CM | POA: Diagnosis not present

## 2022-12-25 DIAGNOSIS — C221 Intrahepatic bile duct carcinoma: Secondary | ICD-10-CM | POA: Diagnosis not present

## 2023-01-03 DIAGNOSIS — D3131 Benign neoplasm of right choroid: Secondary | ICD-10-CM | POA: Diagnosis not present

## 2023-01-29 DIAGNOSIS — D225 Melanocytic nevi of trunk: Secondary | ICD-10-CM | POA: Diagnosis not present

## 2023-01-29 DIAGNOSIS — L570 Actinic keratosis: Secondary | ICD-10-CM | POA: Diagnosis not present

## 2023-01-29 DIAGNOSIS — L2989 Other pruritus: Secondary | ICD-10-CM | POA: Diagnosis not present

## 2023-01-29 DIAGNOSIS — L538 Other specified erythematous conditions: Secondary | ICD-10-CM | POA: Diagnosis not present

## 2023-01-29 DIAGNOSIS — L814 Other melanin hyperpigmentation: Secondary | ICD-10-CM | POA: Diagnosis not present

## 2023-01-29 DIAGNOSIS — L821 Other seborrheic keratosis: Secondary | ICD-10-CM | POA: Diagnosis not present

## 2023-01-29 DIAGNOSIS — Z85820 Personal history of malignant melanoma of skin: Secondary | ICD-10-CM | POA: Diagnosis not present

## 2023-01-29 DIAGNOSIS — R208 Other disturbances of skin sensation: Secondary | ICD-10-CM | POA: Diagnosis not present

## 2023-01-29 DIAGNOSIS — B078 Other viral warts: Secondary | ICD-10-CM | POA: Diagnosis not present

## 2023-02-26 ENCOUNTER — Ambulatory Visit: Payer: PPO

## 2023-02-26 VITALS — Ht 74.0 in | Wt 232.0 lb

## 2023-02-26 DIAGNOSIS — Z0000 Encounter for general adult medical examination without abnormal findings: Secondary | ICD-10-CM

## 2023-02-26 NOTE — Progress Notes (Signed)
Subjective:   Grant Cummings. is a 78 y.o. male who presents for Medicare Annual/Subsequent preventive examination.  Visit Complete: Virtual I connected with  Grant Cummings. on 02/26/23 by a audio enabled telemedicine application and verified that I am speaking with the correct person using two identifiers.  Patient Location: Home  Provider Location: Home Office  This patient declined Interactive audio and video telecommunications. Therefore the visit was completed with audio only.  I discussed the limitations of evaluation and management by telemedicine. The patient expressed understanding and agreed to proceed.  Vital Signs: Because this visit was a virtual/telehealth visit, some criteria may be missing or patient reported. Any vitals not documented were not able to be obtained and vitals that have been documented are patient reported.  Cardiac Risk Factors include: advanced age (>3men, >43 women);dyslipidemia;male gender;hypertension     Objective:    Today's Vitals   02/26/23 1336  Weight: 232 lb (105.2 kg)  Height: 6\' 2"  (1.88 m)   Body mass index is 29.79 kg/m.     02/26/2023    1:38 PM 02/06/2022   10:49 AM 07/16/2021    3:25 PM 07/04/2021    8:52 AM 01/19/2021    9:33 AM 01/19/2020    8:46 AM 12/17/2018    8:46 AM  Advanced Directives  Does Patient Have a Medical Advance Directive? Yes Yes Yes Yes Yes Yes Yes  Type of Advance Directive Living will;Healthcare Power of Attorney Living will;Healthcare Power of [employer] Power of [place];Living will Healthcare Power of [place];Living will Healthcare Power of [place];Living will;Out of facility DNR (pink MOST or yellow form) Healthcare Power of [place];Living will Healthcare Power of [place];Living will  Does patient want to make changes to medical advance directive?  No - Patient declined No - Patient declined  No - Patient declined No - Patient declined No - Patient declined  Copy of Healthcare Power  of Attorney in Chart? Yes - validated most recent copy scanned in chart (See row information) Yes - validated most recent copy scanned in chart (See row information) No - copy requested No - copy requested No - copy requested No - copy requested No - copy requested    Current Medications (verified) Outpatient Encounter Medications as of 02/26/2023  Medication Sig   acetaminophen (TYLENOL) 500 MG tablet Take 500-1,000 mg by mouth every 6 (six) hours as needed (for knee pain.).   amLODipine (NORVASC) 10 MG tablet Take 1 tablet (10 mg total) by mouth daily.   apixaban (ELIQUIS) 5 MG TABS tablet Take 1 tablet (5 mg total) by mouth 2 (two) times daily.   carvedilol (COREG) 12.5 MG tablet Take 1 tablet (12.5 mg total) by mouth 2 (two) times daily with a meal.   cholecalciferol (VITAMIN D) 1000 UNITS tablet Take 1,000 Units by mouth daily.   fluticasone (FLONASE) 50 MCG/ACT nasal spray Place 1-2 sprays into both nostrils daily as needed for allergies or rhinitis.   hydrochlorothiazide (HYDRODIURIL) 25 MG tablet Take 1 tablet (25 mg total) by mouth daily.   Liniments (BLUE-EMU SUPER STRENGTH) CREA Apply 1 application. topically 3 (three) times daily as needed (knee pain.).   Multiple Vitamin (MULTIVITAMIN WITH MINERALS) TABS tablet Take 1 tablet by mouth every evening.   naphazoline-pheniramine (ALLERGY EYE) 0.025-0.3 % ophthalmic solution 1-2 drops 4 (four) times daily as needed for eye irritation or allergies.   pantoprazole (PROTONIX) 40 MG tablet Take 1 tablet (40 mg total) by mouth daily.   potassium chloride (  KLOR-CON M) 10 MEQ tablet Take 1 tablet (10 mEq total) by mouth daily.   tamsulosin (FLOMAX) 0.4 MG CAPS capsule Take 1 capsule (0.4 mg total) by mouth daily.   No facility-administered encounter medications on file as of 02/26/2023.    Allergies (verified) Nsaids   History: Past Medical History:  Diagnosis Date   Anemia    Arthritis    Asbestos exposure    Cataract    GERD  (gastroesophageal reflux disease)    Hyperlipidemia    Hypertension    Liver cancer (HCC)    cholangeocarcinoma   PE (pulmonary thromboembolism) (HCC)    Saddle pulmonary embolus (HCC)    Past Surgical History:  Procedure Laterality Date   APPENDECTOMY     CARPAL TUNNEL WITH CUBITAL TUNNEL Right    CHOLECYSTECTOMY     EYE SURGERY Left 2017   lens implant to correct stigmatism / cataract   KNEE SURGERY     left   MELANOMA EXCISION Left 01/17/2020   partial liver removal     x 3   RADIOFREQUENCY ABLATION LIVER TUMOR     with biopsy  - NEG   TOTAL KNEE ARTHROPLASTY Left 07/16/2021   Procedure: TOTAL KNEE ARTHROPLASTY;  Surgeon: Ollen Gross, MD;  Location: [facility];  Service: Orthopedics;  Laterality: Left;   Family History  Problem Relation Age of Onset   Alzheimer's disease Mother    Cancer Mother 60       breast   Breast cancer Mother    Alzheimer's disease Father    Cancer Father        lymph nodes- jaw   Hypertension Father    Heart disease Father        heart attack    Cancer Sister        ovarian   Hypertension Sister    GI problems Sister    Melanoma Daughter 69   Clotting disorder Paternal Grandfather    Social History   Socioeconomic History   Marital status: Married    Spouse name: Patsy   Number of children: 3   Years of education: 13   Highest education level: Some college, no degree  Occupational History   Occupation: retired    Comment: Health and safety inspector   Occupation: Farm  Tobacco Use   Smoking status: Never    Passive exposure: Yes   Smokeless tobacco: Never  Vaping Use   Vaping status: Never Used  Substance and Sexual Activity   Alcohol use: No   Drug use: No   Sexual activity: Yes  Other Topics Concern   Not on file  Social History Narrative   Married and lives at home with his wife. They have 3 adult children who live in the local area. He has 7 grandchildren and 2 foster grandchildren. He picks up 2 of the grandkids from school daily. He  is very involved with his church and with his family. He has considered joining a gym but has not. He does stay busy at home. 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    Social Drivers of Corporate investment banker Strain: Low Risk  (02/26/2023)   Overall Financial Resource Strain (CARDIA)    Difficulty of Paying Living Expenses: Not hard at all  Food Insecurity: No Food Insecurity (02/26/2023)   Hunger Vital Sign    Worried About Running Out of Food in the Last Year: Never true    Ran Out of Food in the  Last Year: Never true  Transportation Needs: No Transportation Needs (02/26/2023)   PRAPARE - Administrator, Civil Service (Medical): No    Lack of Transportation (Non-Medical): No  Physical Activity: Sufficiently Active (02/26/2023)   Exercise Vital Sign    Days of Exercise per Week: 5 days    Minutes of Exercise per Session: 30 min  Stress: No Stress Concern Present (02/26/2023)   [employer] of Occupational Health - Occupational Stress Questionnaire    Feeling of Stress : Not at all  Social Connections: Socially Integrated (02/26/2023)   Social Connection and Isolation Panel [NHANES]    Frequency of Communication with Friends and Family: More than three times a week    Frequency of Social Gatherings with Friends and Family: Three times a week    Attends Religious Services: More than 4 times per year    Active Member of Clubs or Organizations: Yes    Attends Engineer, structural: More than 4 times per year    Marital Status: Married    Tobacco Counseling Counseling given: Not Answered   Clinical Intake:  Pre-visit preparation completed: Yes  Pain : No/denies pain     Diabetes: No  How often do you need to have someone help you when you read instructions, pamphlets, or other written materials from your doctor or pharmacy?: 1 - Never  Interpreter Needed?: No  Information entered by :: Kandis Fantasia LPN   Activities of Daily Living     02/26/2023    1:36 PM  In your present state of health, do you have any difficulty performing the following activities:  Hearing? 1  Vision? 0  Difficulty concentrating or making decisions? 0  Walking or climbing stairs? 0  Dressing or bathing? 0  Doing errands, shopping? 0  Preparing Food and eating ? N  Using the Toilet? N  In the past six months, have you accidently leaked urine? N  Do you have problems with loss of bowel control? N  Managing your Medications? N  Managing your Finances? N  Housekeeping or managing your Housekeeping? N    Patient Care Team: Dettinger, Elige Radon, MD as PCP - General (Family Medicine) Alinda Dooms, MD as Consulting Physician (Specialist) Angelina Sheriff, Michail Jewels, MD as Referring Physician (Hematology and Oncology) Deveron Furlong, MD as Referring Physician (Surgery) Marcelino Duster, MD as Referring Physician (Dermatology) Dr [facility], OD (Optometry)  Indicate any recent Medical Services you may have received from other than [facility] providers in the past year (date may be approximate).     Assessment:   This is a routine wellness examination for [place].  Hearing/Vision screen Hearing Screening - Comments:: Hard of hearing; no hearing aids  Vision Screening - Comments:: Wears rx glasses - up to date with routine eye exams with Dr. Desiree Lucy     Goals Addressed             This Visit's Progress    COMPLETED: AWV       01/19/2020 AWV Goal: Fall Prevention  Over the next year, patient will decrease their risk for falls by: Using assistive devices, such as a cane or walker, as needed Identifying fall risks within their home and correcting them by: Removing throw rugs Adding handrails to stairs or ramps Removing clutter and keeping a clear pathway throughout the home Increasing light, especially at night Adding shower handles/bars Raising toilet seat Identifying potential personal risk factors for  falls: Medication side effects Incontinence/urgency  Vestibular dysfunction Hearing loss Musculoskeletal disorders Neurological disorders Orthostatic hypotension  01/19/2020 AWV Goal: Exercise for General Health  Patient will verbalize understanding of the benefits of increased physical activity: Exercising regularly is important. It will improve your overall fitness, flexibility, and endurance. Regular exercise also will improve your overall health. It can help you control your weight, reduce stress, and improve your bone density. Over the next year, patient will increase physical activity as tolerated with a goal of at least 150 minutes of moderate physical activity per week.  You can tell that you are exercising at a moderate intensity if your heart starts beating faster and you start breathing faster but can still hold a conversation. Moderate-intensity exercise ideas include: Walking 1 mile (1.6 km) in about 15 minutes Biking Hiking Golfing Dancing Water aerobics Patient will verbalize understanding of everyday activities that increase physical activity by providing examples like the following: Yard work, such as: Insurance underwriter Gardening Washing windows or floors Patient will be able to explain general safety guidelines for exercising:  Before you start a new exercise program, talk with your health care provider. Do not exercise so much that you hurt yourself, feel dizzy, or get very short of breath. Wear comfortable clothes and wear shoes with good support. Drink plenty of water while you exercise to prevent dehydration or heat stroke. Work out until your breathing and your heartbeat get faster.      COMPLETED: DIET - INCREASE WATER INTAKE        Depression Screen    02/26/2023    1:37 PM 12/20/2022    2:12 PM 12/05/2022    1:39 PM 04/25/2022    8:38 AM 02/06/2022   10:36 AM  10/24/2021    8:28 AM 08/02/2021   10:24 AM  PHQ 2/9 Scores  PHQ - 2 Score 0 0 0 0 0 0 0  PHQ- 9 Score  0 0   1     Fall Risk    02/26/2023    1:39 PM 12/20/2022    2:12 PM 12/05/2022    1:39 PM 04/25/2022    8:38 AM 02/06/2022   10:21 AM  Fall Risk   Falls in the past year? 0 1 0 0 1  Number falls in past yr: 0 0 0  0  Injury with Fall? 0 1 0  0  Comment  rib cage     Risk for fall due to : No Fall Risks No Fall Risks No Fall Risks  Orthopedic patient  Follow up Falls prevention discussed;Education provided;Falls evaluation completed Falls evaluation completed Falls evaluation completed  Falls evaluation completed;Education provided;Falls prevention discussed    MEDICARE RISK AT HOME: Medicare Risk at Home Any stairs in or around the home?: No If so, are there any without handrails?: No Home free of loose throw rugs in walkways, pet beds, electrical cords, etc?: Yes Adequate lighting in your home to reduce risk of falls?: Yes Life alert?: No Use of a cane, walker or w/c?: No Grab bars in the bathroom?: Yes Shower chair or bench in shower?: No Elevated toilet seat or a handicapped toilet?: Yes  TIMED UP AND GO:  Was the test performed?  No    Cognitive Function:    12/09/2016    8:41 AM 05/08/2015    9:26 AM  MMSE - Mini Mental State Exam  Orientation to time 5 5  Orientation to Place 5  5  Registration 3 3  Attention/ Calculation 4 4  Recall 3 3  Language- name 2 objects 2 2  Language- repeat 1 1  Language- follow 3 step command 3 3  Language- read & follow direction 1 1  Write a sentence 1 1  Copy design 1 0  Total score 29 28        02/26/2023    1:39 PM 02/06/2022   10:50 AM 01/19/2021    8:29 AM 01/19/2020    8:51 AM 12/17/2018    8:51 AM  6CIT Screen  What Year? 0 points 0 points 0 points 0 points 0 points  What month? 0 points 0 points 0 points 0 points 0 points  What time? 0 points 0 points 0 points 0 points 0 points  Count back from 20 0 points 0  points 0 points 0 points 0 points  Months in reverse 0 points 0 points 0 points 0 points 0 points  Repeat phrase 0 points 0 points 0 points 0 points 0 points  Total Score 0 points 0 points 0 points 0 points 0 points    Immunizations Immunization History  Administered Date(s) Administered   Fluad Quad(high Dose 65+) 01/21/2020, 12/19/2020   Fluad Trivalent(High Dose 65+) 12/20/2022   Influenza, High Dose Seasonal PF 12/01/2015, 12/09/2016, 12/15/2017, 01/02/2019   Influenza,inj,Quad PF,6+ Mos 12/21/2013, 11/22/2014   Influenza,inj,quad, With Preservative 12/01/2012   Influenza-Unspecified 02/14/2012, 01/14/2022   Moderna Sars-Covid-2 Vaccination 03/05/2019, 04/05/2019, 01/27/2020   Pneumococcal Conjugate-13 11/22/2014   Pneumococcal Polysaccharide-23 12/06/2012   Td 11/29/2016   Tdap 11/29/2016    TDAP status: Up to date  Flu Vaccine status: Up to date  Pneumococcal vaccine status: Up to date  Covid-19 vaccine status: Information provided on how to obtain vaccines.   Qualifies for Shingles Vaccine? Yes   Zostavax completed No   Shingrix Completed?: No.    Education has been provided regarding the importance of this vaccine. Patient has been advised to call insurance company to determine out of pocket expense if they have not yet received this vaccine. Advised may also receive vaccine at local pharmacy or Health Dept. Verbalized acceptance and understanding.  Screening Tests Health Maintenance  Topic Date Due   COVID-19 Vaccine (4 - 2024-25 season) 10/13/2022   Zoster Vaccines- Shingrix (1 of 2) 04/24/2023 (Originally 02/23/1964)   Hepatitis C Screening  04/25/2023 (Originally 02/24/1963)   Medicare Annual Wellness (AWV)  02/26/2024   DTaP/Tdap/Td (3 - Td or Tdap) 11/30/2026   Pneumonia Vaccine 67+ Years old  Completed   INFLUENZA VACCINE  Completed   HPV VACCINES  Aged Out   Colonoscopy  Discontinued    Health Maintenance  Health Maintenance Due  Topic Date Due    COVID-19 Vaccine (4 - 2024-25 season) 10/13/2022    Colorectal cancer screening: Type of screening: Colonoscopy. Completed 05/25/21. Repeat every 10 years  Lung Cancer Screening: (Low Dose CT Chest recommended if Age 72-80 years, 20 pack-year currently smoking OR have quit w/in 15years.) does not qualify.   Lung Cancer Screening Referral: n/a  Additional Screening:  Hepatitis C Screening: does qualify  Vision Screening: Recommended annual ophthalmology exams for early detection of glaucoma and other disorders of the eye. Is the patient up to date with their annual eye exam?  Yes  Who is the provider or what is the name of the office in which the patient attends annual eye exams? Dr. Desiree Lucy If pt is not established with a provider, would they like  to be referred to a provider to establish care? No .   Dental Screening: Recommended annual dental exams for proper oral hygiene  Community Resource Referral / Chronic Care Management: CRR required this visit?  No   CCM required this visit?  No     Plan:     I have personally reviewed and noted the following in the patient's chart:   Medical and social history Use of alcohol, tobacco or illicit drugs  Current medications and supplements including opioid prescriptions. Patient is not currently taking opioid prescriptions. Functional ability and status Nutritional status Physical activity Advanced directives List of other physicians Hospitalizations, surgeries, and ER visits in previous 12 months Vitals Screenings to include cognitive, depression, and falls Referrals and appointments  In addition, I have reviewed and discussed with patient certain preventive protocols, quality metrics, and best practice recommendations. A written personalized care plan for preventive services as well as general preventive health recommendations were provided to patient.     Kandis Fantasia [place], [place]   05/20/8117   After Visit  Summary: (MyChart) Due to this being a telephonic visit, the after visit summary with patients personalized plan was offered to patient via MyChart   Nurse Notes: No concerns at this time

## 2023-02-26 NOTE — Patient Instructions (Signed)
 Mr. Shellenbarger , Thank you for taking time to come for your Medicare Wellness Visit. I appreciate your ongoing commitment to your health goals. Please review the following plan we discussed and let me know if I can assist you in the future.   Referrals/Orders/Follow-Ups/Clinician Recommendations: Aim for 30 minutes of exercise or brisk walking, 6-8 glasses of water, and 5 servings of fruits and vegetables each day.  This is a list of the screening recommended for you and due dates:  Health Maintenance  Topic Date Due   COVID-19 Vaccine (4 - 2024-25 season) 10/13/2022   Zoster (Shingles) Vaccine (1 of 2) 04/24/2023*   Hepatitis C Screening  04/25/2023*   Medicare Annual Wellness Visit  02/26/2024   DTaP/Tdap/Td vaccine (3 - Td or Tdap) 11/30/2026   Pneumonia Vaccine  Completed   Flu Shot  Completed   HPV Vaccine  Aged Out   Colon Cancer Screening  Discontinued  *Topic was postponed. The date shown is not the original due date.    Advanced directives: (ACP Link)Information on Advanced Care Planning can be found at [place]  Secretary of [facility] Advance Health Care Directives Advance Health Care Directives ([email or web address]   Next Medicare Annual Wellness Visit scheduled for next year: Yes

## 2023-03-13 DIAGNOSIS — M25561 Pain in right knee: Secondary | ICD-10-CM | POA: Diagnosis not present

## 2023-03-13 DIAGNOSIS — Z96652 Presence of left artificial knee joint: Secondary | ICD-10-CM | POA: Diagnosis not present

## 2023-05-01 DIAGNOSIS — M1711 Unilateral primary osteoarthritis, right knee: Secondary | ICD-10-CM | POA: Diagnosis not present

## 2023-05-01 DIAGNOSIS — Z96652 Presence of left artificial knee joint: Secondary | ICD-10-CM | POA: Diagnosis not present

## 2023-05-12 ENCOUNTER — Other Ambulatory Visit: Payer: Self-pay

## 2023-05-12 ENCOUNTER — Encounter: Payer: Self-pay | Admitting: Physical Therapy

## 2023-05-12 ENCOUNTER — Ambulatory Visit: Attending: Orthopedic Surgery | Admitting: Physical Therapy

## 2023-05-12 DIAGNOSIS — M25662 Stiffness of left knee, not elsewhere classified: Secondary | ICD-10-CM | POA: Diagnosis not present

## 2023-05-12 DIAGNOSIS — G8929 Other chronic pain: Secondary | ICD-10-CM

## 2023-05-12 DIAGNOSIS — M25562 Pain in left knee: Secondary | ICD-10-CM | POA: Insufficient documentation

## 2023-05-12 NOTE — Therapy (Signed)
 OUTPATIENT PHYSICAL THERAPY LOWER EXTREMITY EVALUATION   Patient Name: Grant Cummings. MRN: [id number] DOB:[birth year], 78 y.o., male Today's Date: 05/12/2023  END OF SESSION:  PT End of Session - 05/12/23 1023     Visit Number 1    Number of Visits 12    Date for PT Re-Evaluation 06/09/23    PT Start Time 0856    PT Stop Time 0944    PT Time Calculation (min) 48 min    Activity Tolerance Patient tolerated treatment well    Behavior During Therapy WFL for tasks assessed/performed             Past Medical History:  Diagnosis Date   Anemia    Arthritis    Asbestos exposure    Cataract    GERD (gastroesophageal reflux disease)    Hyperlipidemia    Hypertension    Liver cancer (HCC)    cholangeocarcinoma   PE (pulmonary thromboembolism) (HCC)    Saddle pulmonary embolus (HCC)    Past Surgical History:  Procedure Laterality Date   APPENDECTOMY     CARPAL TUNNEL WITH CUBITAL TUNNEL Right    CHOLECYSTECTOMY     EYE SURGERY Left 2017   lens implant to correct stigmatism / cataract   KNEE SURGERY     left   MELANOMA EXCISION Left 01/17/2020   partial liver removal     x 3   RADIOFREQUENCY ABLATION LIVER TUMOR     with biopsy  - NEG   TOTAL KNEE ARTHROPLASTY Left 07/16/2021   Procedure: TOTAL KNEE ARTHROPLASTY;  Surgeon: Ollen Gross, MD;  Location: [facility];  Service: Orthopedics;  Laterality: Left;   Patient Active Problem List   Diagnosis Date Noted   OA (osteoarthritis) of knee 07/16/2021   Osteoarthritis of left knee 07/16/2021   Personal history of PE (pulmonary embolism) 08/30/2017   Blood type A- 05/08/2015   Hernia of abdominal wall 05/08/2015   Edema 11/22/2014   BPH (benign prostatic hyperplasia) 03/22/2013   Vitamin D deficiency 03/22/2013   Hearing deficit 03/22/2013   Liver cancer (HCC)    Hyperlipidemia    Hypertension     REFERRING PROVIDER: Ollen Gross MD  REFERRING DIAG: H/o of left total knee replacement  THERAPY DIAG:  Chronic  pain of left knee  Stiffness of left knee, not elsewhere classified  Rationale for Evaluation and Treatment: Rehabilitation  ONSET DATE: Ongoing.    SUBJECTIVE:   SUBJECTIVE STATEMENT: The patient presents to the clinic with c/o left knee pain.  He had a TKA on 07/16/21.  His wife, Lura Em, was present during the evaluation and remembers years before his total knee he had sustained a very significant left quadriceps injury.  He reports no pain at rest today.  He does report, however, his pain increasing to moderate+ levels after sitting for a prolonged period of time and then getting up and also when ascending and descending stairs.     PERTINENT HISTORY: Left TKA.  Prior left quad injury. PAIN:  Are you having pain? No  PRECAUTIONS: Other: No ultrasound over left knee.    RED FLAGS: None   WEIGHT BEARING RESTRICTIONS: No  FALLS:  Has patient fallen in last 6 months? Yes. Number of falls Related to haying (800# round bale).    LIVING ENVIRONMENT: Lives with: lives with their spouse Lives in: House/apartment Stairs:  Ramp and stairs with railing.   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    PLOF: Independent  PATIENT GOALS: To be able to walk more for enjoyment and quality of life.      OBJECTIVE:  Note: Objective measures were completed at Evaluation unless otherwise noted.  PATIENT SURVEYS:  LEFS 23/80.  EDEMA:  No edema noted.  PALPATION: C/o palpable pain over left knee incision site and over left quadriceps to approximately the mid-portion of the musculature.    LOWER EXTREMITY ROM:  The patient's left knee extension is -15 degrees (he states it has been like this for years).  His flexion is 110 degrees.    LOWER EXTREMITY MMT:  Normal left knee and hip strength.  GAIT: Mild gait antalgia noted with left knee held in flexion.                                                                                                                                  TREATMENT DATE: 05/12/23:   HMP and IFC at 80-150 Hz on 40% scan x 20 minutes to affected left quadriceps.   Normal modality response following removal of modality.  PATIENT EDUCATION:  Education details:  Person educated:  International aid/development worker:  Education comprehension:   HOME EXERCISE PROGRAM:   ASSESSMENT:  CLINICAL IMPRESSION: The patient presents to [facility] with c/o ongoing left knee pain over several years.  He has palpable pain over left knee incision site and over left quadriceps to approximately the mid-portion of the musculature.  He has a 15 degrees flexion contracture.  His left LE exhibits normal strength.  His LEFS score 23/80.  His pain increase when walking after prolonged sitting and ascending and descending stairs.  Patient will benefit from skilled physical therapy intervention to address pain and deficits.  OBJECTIVE IMPAIRMENTS: decreased activity tolerance, decreased ROM, increased muscle spasms, and pain.   ACTIVITY LIMITATIONS: carrying, lifting, and stairs  PARTICIPATION LIMITATIONS: cleaning and yard work  PERSONAL FACTORS: Time since onset of injury/illness/exacerbation and 1 comorbidity: prior left quad injury  are also affecting patient's functional outcome.   REHAB POTENTIAL: Good  CLINICAL DECISION MAKING: Stable/uncomplicated  EVALUATION COMPLEXITY: Low   GOALS:  LONG TERM GOALS: Target date: 06/09/23  Ind with a HEP.  Goal status: INITIAL  2.  Go from sitting to standing and walk with left knee pain not > 3/10.  Goal status: INITIAL  3.  Ascend and descend stairs with left knee pain not > 3/10.    Goal status: INITIAL  4.  Walk a community distance with pain not > 3/10.  Goal status: INITIAL   PLAN:  PT FREQUENCY:  2-3 times a week.  PT DURATION: 4 weeks  PLANNED INTERVENTIONS: 97110-Therapeutic exercises, 97530- Therapeutic activity, [id number]- Neuromuscular re-education, 97535-  Self Care, 16109- Manual therapy, G0283- Electrical stimulation (unattended), 97016- Vasopneumatic device, Stair training, Cryotherapy, and Moist heat  PLAN FOR NEXT SESSION: Soft tissue work to left quadriceps including IASTM, Nustep.  IFC.  Tarius Stangelo, [place], PT 05/12/2023, 11:03 AM

## 2023-05-16 ENCOUNTER — Ambulatory Visit: Attending: Orthopedic Surgery

## 2023-05-16 DIAGNOSIS — M25562 Pain in left knee: Secondary | ICD-10-CM | POA: Diagnosis not present

## 2023-05-16 DIAGNOSIS — M25662 Stiffness of left knee, not elsewhere classified: Secondary | ICD-10-CM

## 2023-05-16 DIAGNOSIS — G8929 Other chronic pain: Secondary | ICD-10-CM

## 2023-05-16 NOTE — Therapy (Signed)
 OUTPATIENT PHYSICAL THERAPY LOWER EXTREMITY TREATMENT    Patient Name: Grant Cummings. MRN: [id number] DOB:[birth year], 78 y.o., male Today's Date: 05/16/2023  END OF SESSION:  PT End of Session - 05/16/23 0934     Visit Number 2    Number of Visits 12    Date for PT Re-Evaluation 06/09/23    PT Start Time 0930    PT Stop Time 1018    PT Time Calculation (min) 48 min    Activity Tolerance Patient tolerated treatment well    Behavior During Therapy WFL for tasks assessed/performed             Past Medical History:  Diagnosis Date   Anemia    Arthritis    Asbestos exposure    Cataract    GERD (gastroesophageal reflux disease)    Hyperlipidemia    Hypertension    Liver cancer (HCC)    cholangeocarcinoma   PE (pulmonary thromboembolism) (HCC)    Saddle pulmonary embolus (HCC)    Past Surgical History:  Procedure Laterality Date   APPENDECTOMY     CARPAL TUNNEL WITH CUBITAL TUNNEL Right    CHOLECYSTECTOMY     EYE SURGERY Left 2017   lens implant to correct stigmatism / cataract   KNEE SURGERY     left   MELANOMA EXCISION Left 01/17/2020   partial liver removal     x 3   RADIOFREQUENCY ABLATION LIVER TUMOR     with biopsy  - NEG   TOTAL KNEE ARTHROPLASTY Left 07/16/2021   Procedure: TOTAL KNEE ARTHROPLASTY;  Surgeon: Ollen Gross, MD;  Location: [facility];  Service: Orthopedics;  Laterality: Left;   Patient Active Problem List   Diagnosis Date Noted   OA (osteoarthritis) of knee 07/16/2021   Osteoarthritis of left knee 07/16/2021   Personal history of PE (pulmonary embolism) 08/30/2017   Blood type A- 05/08/2015   Hernia of abdominal wall 05/08/2015   Edema 11/22/2014   BPH (benign prostatic hyperplasia) 03/22/2013   Vitamin D deficiency 03/22/2013   Hearing deficit 03/22/2013   Liver cancer (HCC)    Hyperlipidemia    Hypertension     REFERRING PROVIDER: Ollen Gross MD  REFERRING DIAG: H/o of left total knee replacement  THERAPY DIAG:  Chronic  pain of left knee  Stiffness of left knee, not elsewhere classified  Rationale for Evaluation and Treatment: Rehabilitation  ONSET DATE: Ongoing.    SUBJECTIVE:   SUBJECTIVE STATEMENT:   The patient reported minimal knee pain when resting. Said his quadriceps tightness gives him issues with his balance and going from sit to stand. Pain is a 5/10 pain when walking.    PERTINENT HISTORY: Left TKA.  Prior left quad injury. PAIN:  Are you having pain? No  PRECAUTIONS: Other: No ultrasound over left knee.    RED FLAGS: None   WEIGHT BEARING RESTRICTIONS: No  FALLS:  Has patient fallen in last 6 months? Yes. Number of falls Related to haying (800# round bale).    LIVING ENVIRONMENT: Lives with: lives with their spouse Lives in: House/apartment Stairs:  Ramp and stairs with railing.   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    PLOF: Independent  PATIENT GOALS: To be able to walk more for enjoyment and quality of life.      OBJECTIVE:  Note: Objective measures were completed at Evaluation unless otherwise noted.  PATIENT SURVEYS:  LEFS 23/80.  EDEMA:  No edema noted.  PALPATION: C/o palpable pain over left knee incision site and over left quadriceps to approximately the mid-portion of the musculature.    LOWER EXTREMITY ROM:  The patient's left knee extension is -15 degrees (he states it has been like this for years).  His flexion is 110 degrees.    LOWER EXTREMITY MMT:  Normal left knee and hip strength.  GAIT: Mild gait antalgia noted with left knee held in flexion.                                                                                                                                 TREATMENT DATE:                                     05/15/23 EXERCISE LOG  Exercise Repetitions and Resistance Comments   Nu step   15 minutes level 4   Arm support level 6, chair at 10   Lunges  3  minutes 14 inch   TKE Standing   3 sets 1 minute    Heel Raises   3 sets of 12 reps    LAQ  2 sets of 12 reps   (Both sides)            Blank cell = exercise not performed today   Manual Therapy   Soft Tissue Mobilization: left knee, Quadriceps muscle in order to decrease pain and stiffness. Patient reported improved mobility, decreased stiffness and no adverse effects upon completion of this element of treatment session.    Joint Mobilizations:   PA Tibiofemoral grade III joint mobilization performed to address knee extension mobility restrictions and stiffness. Patient reported decreased stiffness and no adverse side effects upon completion of this element of the treatment session.   05/12/23:   HMP and IFC at 80-150 Hz on 40% scan x 20 minutes to affected left quadriceps.   Normal modality response following removal of modality.  PATIENT EDUCATION:  Education details:  Person educated:  International aid/development worker:  Education comprehension:   HOME EXERCISE PROGRAM: [email or web address]  Access Code: [email or web address]  ASSESSMENT:  CLINICAL IMPRESSION: Pt arrives for today's treatment session reporting 5/10 left knee pain with ambulation, but 0/10 at rest.  Pt introduced to various standing and seated exercises today to increase strength, function, and ROM while decreasing pain.  Pt requiring min cues for proper technique and posture with all newly added exercises.  STW/M performed to distal quad to decrease pain and tone.  Grade III tibiofemoral joint mobs performed to assist with increased left knee extension.  Pt reported decreased pain and stiffness at completion of today's treatment session.   OBJECTIVE IMPAIRMENTS: decreased activity tolerance, decreased ROM, increased muscle spasms, and pain.   ACTIVITY LIMITATIONS: carrying, lifting, and stairs  PARTICIPATION LIMITATIONS: cleaning and yard work  PERSONAL FACTORS: Time since onset of injury/illness/exacerbation  and 1  comorbidity: prior left quad injury  are also affecting patient's functional outcome.   REHAB POTENTIAL: Good  CLINICAL DECISION MAKING: Stable/uncomplicated  EVALUATION COMPLEXITY: Low   GOALS:  LONG TERM GOALS: Target date: 06/09/23  Ind with a HEP.  Goal status: INITIAL  2.  Go from sitting to standing and walk with left knee pain not > 3/10.  Goal status: INITIAL  3.  Ascend and descend stairs with left knee pain not > 3/10.    Goal status: INITIAL  4.  Walk a community distance with pain not > 3/10.  Goal status: INITIAL   PLAN:  PT FREQUENCY:  2-3 times a week.  PT DURATION: 4 weeks  PLANNED INTERVENTIONS: 97110-Therapeutic exercises, 97530- Therapeutic activity, [id number]- Neuromuscular re-education, 97535- Self Care, [id number]- Manual therapy, G0283- Electrical stimulation (unattended), 97016- Vasopneumatic device, Stair training, Cryotherapy, and Moist heat  PLAN FOR NEXT SESSION: Soft tissue work to left quadriceps including IASTM, Nustep.  IFC.    Newman Pies, PTA 05/16/2023, 10:31 AM

## 2023-05-19 ENCOUNTER — Ambulatory Visit

## 2023-05-19 DIAGNOSIS — M25662 Stiffness of left knee, not elsewhere classified: Secondary | ICD-10-CM

## 2023-05-19 DIAGNOSIS — M25562 Pain in left knee: Secondary | ICD-10-CM | POA: Diagnosis not present

## 2023-05-19 DIAGNOSIS — G8929 Other chronic pain: Secondary | ICD-10-CM

## 2023-05-19 NOTE — Therapy (Signed)
 OUTPATIENT PHYSICAL THERAPY LOWER EXTREMITY TREATMENT    Patient Name: Grant Cummings. MRN: [id number] DOB:06-May-1945, 78 y.o., male Today's Date: 05/19/2023  END OF SESSION:  PT End of Session - 05/19/23 0934     Visit Number 3    Number of Visits 12    Date for PT Re-Evaluation 06/09/23    PT Start Time 0930    PT Stop Time 1020    PT Time Calculation (min) 50 min    Activity Tolerance Patient tolerated treatment well    Behavior During Therapy WFL for tasks assessed/performed             Past Medical History:  Diagnosis Date   Anemia    Arthritis    Asbestos exposure    Cataract    GERD (gastroesophageal reflux disease)    Hyperlipidemia    Hypertension    Liver cancer (HCC)    cholangeocarcinoma   PE (pulmonary thromboembolism) (HCC)    Saddle pulmonary embolus (HCC)    Past Surgical History:  Procedure Laterality Date   APPENDECTOMY     CARPAL TUNNEL WITH CUBITAL TUNNEL Right    CHOLECYSTECTOMY     EYE SURGERY Left 2017   lens implant to correct stigmatism / cataract   KNEE SURGERY     left   MELANOMA EXCISION Left 01/17/2020   partial liver removal     x 3   RADIOFREQUENCY ABLATION LIVER TUMOR     with biopsy  - NEG   TOTAL KNEE ARTHROPLASTY Left 07/16/2021   Procedure: TOTAL KNEE ARTHROPLASTY;  Surgeon: Ollen Gross, MD;  Location: [facility];  Service: Orthopedics;  Laterality: Left;   Patient Active Problem List   Diagnosis Date Noted   OA (osteoarthritis) of knee 07/16/2021   Osteoarthritis of left knee 07/16/2021   Personal history of PE (pulmonary embolism) 08/30/2017   Blood type A- 05/08/2015   Hernia of abdominal wall 05/08/2015   Edema 11/22/2014   BPH (benign prostatic hyperplasia) 03/22/2013   Vitamin D deficiency 03/22/2013   Hearing deficit 03/22/2013   Liver cancer (HCC)    Hyperlipidemia    Hypertension     REFERRING PROVIDER: Ollen Gross MD  REFERRING DIAG: H/o of left total knee replacement  THERAPY DIAG:  Chronic  pain of left knee  Stiffness of left knee, not elsewhere classified  Rationale for Evaluation and Treatment: Rehabilitation  ONSET DATE: Ongoing.    SUBJECTIVE:   SUBJECTIVE STATEMENT:   Pt denies any pain today. Reported slight soreness lasting less than 24 hours after previous treatment. Pain was 0/10 at beginning of treatment.   PERTINENT HISTORY: Left TKA.  Prior left quad injury. PAIN:  Are you having pain? No  PRECAUTIONS: Other: No ultrasound over left knee.    RED FLAGS: None   WEIGHT BEARING RESTRICTIONS: No  FALLS:  Has patient fallen in last 6 months? Yes. Number of falls Related to haying (800# round bale).    LIVING ENVIRONMENT: Lives with: lives with their spouse Lives in: House/apartment Stairs:  Ramp and stairs with railing.   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    PLOF: Independent  PATIENT GOALS: To be able to walk more for enjoyment and quality of life.      OBJECTIVE:  Note: Objective measures were completed at Evaluation unless otherwise noted.  PATIENT SURVEYS:  LEFS 23/80.  EDEMA:  No edema noted.  PALPATION: C/o palpable pain over  left knee incision site and over left quadriceps to approximately the mid-portion of the musculature.    LOWER EXTREMITY ROM:  The patient's left knee extension is -15 degrees (he states it has been like this for years).  His flexion is 110 degrees.    LOWER EXTREMITY MMT:  Normal left knee and hip strength.  GAIT: Mild gait antalgia noted with left knee held in flexion.                                                                                                                                 TREATMENT DATE:              05/19/23   EXERCISE LOG  Exercise Repetitions and Resistance Comments  Nu step  15 minutes level 4   Arm support level 6, chair at 10   Forward Step Ups  8" box 3 sets of 12 reps   TKE Standing  3 sets 1 minute     Heel Raises  3 sets of 12 reps    LAQ  2# 1 set of 12; 3# 2 set of 12  (Both sides)            Blank cell = exercise not performed today   Manual Therapy   Soft Tissue Mobilization: left knee, patellar tendon and quadriceps muscle in order to decrease pain and stiffness. Patient reported improved mobility, decreased stiffness and no adverse effects upon completion of this element of treatment session.     05/12/23:   HMP and IFC at 80-150 Hz on 40% scan x 20 minutes to affected left quadriceps.   Normal modality response following removal of modality.  PATIENT EDUCATION:  Education details:  Person educated:  International aid/development worker:  Education comprehension:   HOME EXERCISE PROGRAM: [email or web address]  Access Code: [email or web address]  ASSESSMENT:  CLINICAL IMPRESSION:  Pt arrived for today's treatment session denying any pain. He needed tactile and verbal cueing to ensure proper mechanics and muscle engagement for standing TKE, forward step ups, and LAQ. He was provided guidance on how to modify his TKE exercise included in his HEP and verbalized compliance. He was provided STW/M to left quadricep and patellar tendon area. He reported decreased stiffness and increased mobility with his left knee at the end of treatment. Patient will continue to benefit from skilled PT to address impairments and decrease pain in order to improve functional mobility.   OBJECTIVE IMPAIRMENTS: decreased activity tolerance, decreased ROM, increased muscle spasms, and pain.   ACTIVITY LIMITATIONS: carrying, lifting, and stairs  PARTICIPATION LIMITATIONS: cleaning and yard work  PERSONAL FACTORS: Time since onset of injury/illness/exacerbation and 1 comorbidity: prior left quad injury  are also affecting patient's functional outcome.   REHAB POTENTIAL: Good  CLINICAL DECISION MAKING: Stable/uncomplicated  EVALUATION COMPLEXITY: Low   GOALS:  LONG TERM GOALS: Target date: 06/09/23  Ind  with a HEP.  Goal status: INITIAL  2.  Go from  sitting to standing and walk with left knee pain not > 3/10.  Goal status: INITIAL  3.  Ascend and descend stairs with left knee pain not > 3/10.    Goal status: INITIAL  4.  Walk a community distance with pain not > 3/10.  Goal status: INITIAL   PLAN:  PT FREQUENCY:  2-3 times a week.  PT DURATION: 4 weeks  PLANNED INTERVENTIONS: 97110-Therapeutic exercises, 97530- Therapeutic activity, [id number]- Neuromuscular re-education, 97535- Self Care, 16109- Manual therapy, G0283- Electrical stimulation (unattended), 97016- Vasopneumatic device, Stair training, Cryotherapy, and Moist heat  PLAN FOR NEXT SESSION: Nustep, progress hip, knee and lower leg muscle strengthening exercises, continue functional mobility exercises, introduce balance exercises. Soft tissue work to left quadriceps and patellar tendon. Modalities as needed   Newman Pies, PTA 05/19/2023, 10:53 AM

## 2023-05-23 ENCOUNTER — Ambulatory Visit

## 2023-05-23 DIAGNOSIS — G8929 Other chronic pain: Secondary | ICD-10-CM

## 2023-05-23 DIAGNOSIS — M25562 Pain in left knee: Secondary | ICD-10-CM | POA: Diagnosis not present

## 2023-05-23 DIAGNOSIS — M25662 Stiffness of left knee, not elsewhere classified: Secondary | ICD-10-CM

## 2023-05-23 NOTE — Therapy (Signed)
 OUTPATIENT PHYSICAL THERAPY LOWER EXTREMITY TREATMENT    Patient Name: Grant Cummings. MRN: [id number] DOB:08-28-1945, 78 y.o., male Today's Date: 05/23/2023  END OF SESSION:  PT End of Session - 05/23/23 0934     Visit Number 4    Number of Visits 12    Date for PT Re-Evaluation 06/09/23    PT Start Time 0930    PT Stop Time 1013    PT Time Calculation (min) 43 min    Activity Tolerance Patient tolerated treatment well    Behavior During Therapy WFL for tasks assessed/performed              Past Medical History:  Diagnosis Date   Anemia    Arthritis    Asbestos exposure    Cataract    GERD (gastroesophageal reflux disease)    Hyperlipidemia    Hypertension    Liver cancer (HCC)    cholangeocarcinoma   PE (pulmonary thromboembolism) (HCC)    Saddle pulmonary embolus (HCC)    Past Surgical History:  Procedure Laterality Date   APPENDECTOMY     CARPAL TUNNEL WITH CUBITAL TUNNEL Right    CHOLECYSTECTOMY     EYE SURGERY Left 2017   lens implant to correct stigmatism / cataract   KNEE SURGERY     left   MELANOMA EXCISION Left 01/17/2020   partial liver removal     x 3   RADIOFREQUENCY ABLATION LIVER TUMOR     with biopsy  - NEG   TOTAL KNEE ARTHROPLASTY Left 07/16/2021   Procedure: TOTAL KNEE ARTHROPLASTY;  Surgeon: Ollen Gross, MD;  Location: [facility];  Service: Orthopedics;  Laterality: Left;   Patient Active Problem List   Diagnosis Date Noted   OA (osteoarthritis) of knee 07/16/2021   Osteoarthritis of left knee 07/16/2021   Personal history of PE (pulmonary embolism) 08/30/2017   Blood type A- 05/08/2015   Hernia of abdominal wall 05/08/2015   Edema 11/22/2014   BPH (benign prostatic hyperplasia) 03/22/2013   Vitamin D deficiency 03/22/2013   Hearing deficit 03/22/2013   Liver cancer (HCC)    Hyperlipidemia    Hypertension     REFERRING PROVIDER: Ollen Gross MD  REFERRING DIAG: H/o of left total knee replacement  THERAPY DIAG:  Chronic  pain of left knee  Stiffness of left knee, not elsewhere classified  Rationale for Evaluation and Treatment: Rehabilitation  ONSET DATE: Ongoing.    SUBJECTIVE:   SUBJECTIVE STATEMENT:   Patient reports that he is not hurting any today.   PERTINENT HISTORY: Left TKA.  Prior left quad injury. PAIN:  Are you having pain? No  PRECAUTIONS: Other: No ultrasound over left knee.    RED FLAGS: None   WEIGHT BEARING RESTRICTIONS: No  FALLS:  Has patient fallen in last 6 months? Yes. Number of falls Related to haying (800# round bale).    LIVING ENVIRONMENT: Lives with: lives with their spouse Lives in: House/apartment Stairs:  Ramp and stairs with railing.   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    PLOF: Independent  PATIENT GOALS: To be able to walk more for enjoyment and quality of life.      OBJECTIVE:  Note: Objective measures were completed at Evaluation unless otherwise noted.  PATIENT SURVEYS:  LEFS 23/80.  EDEMA:  No edema noted.  PALPATION: C/o palpable pain over left knee incision site and over left quadriceps to approximately the mid-portion of  the musculature.    LOWER EXTREMITY ROM:  The patient's left knee extension is -15 degrees (he states it has been like this for years).  His flexion is 110 degrees.    LOWER EXTREMITY MMT:  Normal left knee and hip strength.  GAIT: Mild gait antalgia noted with left knee held in flexion.                                                                                                                                 TREATMENT DATE:                                    05/23/23 EXERCISE LOG  Exercise Repetitions and Resistance Comments  Nustep  L4-5 x 15 minutes    Step up  6" step x 3 minutes   Eccentric step down  6" step x 2 minutes  For LLE only   LAQ  4# x 3.5 minutes w/ 5 second hold   Rocker board  5 minutes     Blank cell = exercise not  performed today              05/19/23   EXERCISE LOG  Exercise Repetitions and Resistance Comments  Nu step  15 minutes level 4   Arm support level 6, chair at 10   Forward Step Ups  8" box 3 sets of 12 reps   TKE Standing  3 sets 1 minute    Heel Raises  3 sets of 12 reps    LAQ  2# 1 set of 12; 3# 2 set of 12  (Both sides)            Blank cell = exercise not performed today   Manual Therapy   Soft Tissue Mobilization: left knee, patellar tendon and quadriceps muscle in order to decrease pain and stiffness. Patient reported improved mobility, decreased stiffness and no adverse effects upon completion of this element of treatment session.     05/12/23:   HMP and IFC at 80-150 Hz on 40% scan x 20 minutes to affected left quadriceps.   Normal modality response following removal of modality.  PATIENT EDUCATION:  Education details: healing and HEP  Person educated: patient  Education method: verbal Education comprehension: patient reported understanding   HOME EXERCISE PROGRAM: [email or web address]  Access Code: [email or web address]  ASSESSMENT:  CLINICAL IMPRESSION:  Patient was progressed with eccentric step downs and other familiar interventions for improved function with activities such as descending stairs. He required minimal cueing with eccentric step downs for proper exercise performance to facilitate proper quadriceps engagement. He experienced no increase in pain or discomfort with any of today's interventions. He reported that his knee felt "a little tight" upon the conclusion of treatment. He continues to require skilled physical therapy to address his remaining impairments to return to his prior level of function.  OBJECTIVE IMPAIRMENTS: decreased activity tolerance, decreased ROM, increased muscle spasms, and pain.   ACTIVITY LIMITATIONS: carrying, lifting, and stairs  PARTICIPATION LIMITATIONS: cleaning and yard work  PERSONAL FACTORS: Time since onset of  injury/illness/exacerbation and 1 comorbidity: prior left quad injury  are also affecting patient's functional outcome.   REHAB POTENTIAL: Good  CLINICAL DECISION MAKING: Stable/uncomplicated  EVALUATION COMPLEXITY: Low   GOALS:  LONG TERM GOALS: Target date: 06/09/23  Ind with a HEP.  Goal status: INITIAL  2.  Go from sitting to standing and walk with left knee pain not > 3/10.  Goal status: INITIAL  3.  Ascend and descend stairs with left knee pain not > 3/10.    Goal status: INITIAL  4.  Walk a community distance with pain not > 3/10.  Goal status: INITIAL   PLAN:  PT FREQUENCY:  2-3 times a week.  PT DURATION: 4 weeks  PLANNED INTERVENTIONS: 97110-Therapeutic exercises, 97530- Therapeutic activity, [id number]- Neuromuscular re-education, 97535- Self Care, 96045- Manual therapy, G0283- Electrical stimulation (unattended), 97016- Vasopneumatic device, Stair training, Cryotherapy, and Moist heat  PLAN FOR NEXT SESSION: Nustep, progress hip, knee and lower leg muscle strengthening exercises, continue functional mobility exercises, introduce balance exercises. Soft tissue work to left quadriceps and patellar tendon. Modalities as needed   Granville Lewis, PT 05/23/2023, 10:27 AM

## 2023-05-26 ENCOUNTER — Ambulatory Visit

## 2023-05-26 DIAGNOSIS — M25662 Stiffness of left knee, not elsewhere classified: Secondary | ICD-10-CM

## 2023-05-26 DIAGNOSIS — G8929 Other chronic pain: Secondary | ICD-10-CM

## 2023-05-26 DIAGNOSIS — M25562 Pain in left knee: Secondary | ICD-10-CM | POA: Diagnosis not present

## 2023-05-26 NOTE — Therapy (Signed)
 OUTPATIENT PHYSICAL THERAPY LOWER EXTREMITY TREATMENT    Patient Name: Grant Cummings. MRN: [id number] DOB:September 28, 1945, 78 y.o., male Today's Date: 05/26/2023  END OF SESSION:  PT End of Session - 05/26/23 1345     Visit Number 5    Number of Visits 12    Date for PT Re-Evaluation 06/09/23    PT Start Time 1345    PT Stop Time 1428    PT Time Calculation (min) 43 min    Activity Tolerance Patient tolerated treatment well    Behavior During Therapy WFL for tasks assessed/performed               Past Medical History:  Diagnosis Date   Anemia    Arthritis    Asbestos exposure    Cataract    GERD (gastroesophageal reflux disease)    Hyperlipidemia    Hypertension    Liver cancer (HCC)    cholangeocarcinoma   PE (pulmonary thromboembolism) (HCC)    Saddle pulmonary embolus (HCC)    Past Surgical History:  Procedure Laterality Date   APPENDECTOMY     CARPAL TUNNEL WITH CUBITAL TUNNEL Right    CHOLECYSTECTOMY     EYE SURGERY Left 2017   lens implant to correct stigmatism / cataract   KNEE SURGERY     left   MELANOMA EXCISION Left 01/17/2020   partial liver removal     x 3   RADIOFREQUENCY ABLATION LIVER TUMOR     with biopsy  - NEG   TOTAL KNEE ARTHROPLASTY Left 07/16/2021   Procedure: TOTAL KNEE ARTHROPLASTY;  Surgeon: Liliane Rei, MD;  Location: [facility];  Service: Orthopedics;  Laterality: Left;   Patient Active Problem List   Diagnosis Date Noted   OA (osteoarthritis) of knee 07/16/2021   Osteoarthritis of left knee 07/16/2021   Personal history of PE (pulmonary embolism) 08/30/2017   Blood type A- 05/08/2015   Hernia of abdominal wall 05/08/2015   Edema 11/22/2014   BPH (benign prostatic hyperplasia) 03/22/2013   Vitamin D deficiency 03/22/2013   Hearing deficit 03/22/2013   Liver cancer (HCC)    Hyperlipidemia    Hypertension     REFERRING PROVIDER: Liliane Rei MD  REFERRING DIAG: H/o of left total knee replacement  THERAPY DIAG:   Chronic pain of left knee  Stiffness of left knee, not elsewhere classified  Rationale for Evaluation and Treatment: Rehabilitation  ONSET DATE: Ongoing.    SUBJECTIVE:   SUBJECTIVE STATEMENT:   Patient reports that he is not hurting any.   PERTINENT HISTORY: Left TKA.  Prior left quad injury. PAIN:  Are you having pain? No  PRECAUTIONS: Other: No ultrasound over left knee.    RED FLAGS: None   WEIGHT BEARING RESTRICTIONS: No  FALLS:  Has patient fallen in last 6 months? Yes. Number of falls Related to haying (800# round bale).    LIVING ENVIRONMENT: Lives with: lives with their spouse Lives in: House/apartment Stairs:  Ramp and stairs with railing.   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    PLOF: Independent  PATIENT GOALS: To be able to walk more for enjoyment and quality of life.      OBJECTIVE:  Note: Objective measures were completed at Evaluation unless otherwise noted.  PATIENT SURVEYS:  LEFS 23/80.  EDEMA:  No edema noted.  PALPATION: C/o palpable pain over left knee incision site and over left quadriceps to approximately the mid-portion of  the musculature.    LOWER EXTREMITY ROM:  The patient's left knee extension is -15 degrees (he states it has been like this for years).  His flexion is 110 degrees.    LOWER EXTREMITY MMT:  Normal left knee and hip strength.  GAIT: Mild gait antalgia noted with left knee held in flexion.                                                                                                                                 TREATMENT DATE:                                    05/26/23 EXERCISE LOG  Exercise Repetitions and Resistance Comments  Nustep  L4 x 18.5 minutes   Cybex knee extension 20# x 3 minutes   Cybex knee flexion  40# x 3 minutes   Rocker board  5 minutes    Step up  8" step x 3 minutes  Leading with LLE   Sit to stand  35 reps   Without UE support   Blank cell = exercise not performed today                                    05/23/23 EXERCISE LOG  Exercise Repetitions and Resistance Comments  Nustep  L4-5 x 15 minutes    Step up  6" step x 3 minutes   Eccentric step down  6" step x 2 minutes  For LLE only   LAQ  4# x 3.5 minutes w/ 5 second hold   Rocker board  5 minutes     Blank cell = exercise not performed today              05/19/23   EXERCISE LOG  Exercise Repetitions and Resistance Comments  Nu step  15 minutes level 4   Arm support level 6, chair at 10   Forward Step Ups  8" box 3 sets of 12 reps   TKE Standing  3 sets 1 minute    Heel Raises  3 sets of 12 reps    LAQ  2# 1 set of 12; 3# 2 set of 12  (Both sides)            Blank cell = exercise not performed today   Manual Therapy   Soft Tissue Mobilization: left knee, patellar tendon and quadriceps muscle in order to decrease pain and stiffness. Patient reported improved mobility, decreased stiffness and no adverse effects upon completion of this element of treatment session.     05/12/23:   HMP and IFC at 80-150 Hz on 40% scan x 20 minutes to affected left quadriceps.   Normal modality response following removal of modality.  PATIENT EDUCATION:  Education details: healing and HEP  Person educated: patient  Education method: verbal Education comprehension: patient reported understanding   HOME EXERCISE PROGRAM: [email or web address]  Access Code: [email or web address]  ASSESSMENT:  CLINICAL IMPRESSION:  Patient was progressed with new and familiar interventions for improved lower extremity strength and stability. He required minimal cueing with today's new interventions for proper exercise performance. He experienced no increase in pain or discomfort with any of today's interventions. He reported that his knee felt "fine" upon the conclusion of treatment. He continues to require skilled physical therapy to address his remaining  impairments to return to his prior level of function.   OBJECTIVE IMPAIRMENTS: decreased activity tolerance, decreased ROM, increased muscle spasms, and pain.   ACTIVITY LIMITATIONS: carrying, lifting, and stairs  PARTICIPATION LIMITATIONS: cleaning and yard work  PERSONAL FACTORS: Time since onset of injury/illness/exacerbation and 1 comorbidity: prior left quad injury  are also affecting patient's functional outcome.   REHAB POTENTIAL: Good  CLINICAL DECISION MAKING: Stable/uncomplicated  EVALUATION COMPLEXITY: Low   GOALS:  LONG TERM GOALS: Target date: 06/09/23  Ind with a HEP.  Goal status: INITIAL  2.  Go from sitting to standing and walk with left knee pain not > 3/10.  Goal status: INITIAL  3.  Ascend and descend stairs with left knee pain not > 3/10.    Goal status: INITIAL  4.  Walk a community distance with pain not > 3/10.  Goal status: INITIAL   PLAN:  PT FREQUENCY:  2-3 times a week.  PT DURATION: 4 weeks  PLANNED INTERVENTIONS: 97110-Therapeutic exercises, 97530- Therapeutic activity, [id number]- Neuromuscular re-education, 97535- Self Care, 72536- Manual therapy, G0283- Electrical stimulation (unattended), 97016- Vasopneumatic device, Stair training, Cryotherapy, and Moist heat  PLAN FOR NEXT SESSION: Nustep, progress hip, knee and lower leg muscle strengthening exercises, continue functional mobility exercises, introduce balance exercises. Soft tissue work to left quadriceps and patellar tendon. Modalities as needed   Lane Pinon, PT 05/26/2023, 4:17 PM

## 2023-06-02 ENCOUNTER — Encounter

## 2023-06-03 ENCOUNTER — Ambulatory Visit: Admitting: Physical Therapy

## 2023-06-04 DIAGNOSIS — L814 Other melanin hyperpigmentation: Secondary | ICD-10-CM | POA: Diagnosis not present

## 2023-06-04 DIAGNOSIS — Z7189 Other specified counseling: Secondary | ICD-10-CM | POA: Diagnosis not present

## 2023-06-04 DIAGNOSIS — Z08 Encounter for follow-up examination after completed treatment for malignant neoplasm: Secondary | ICD-10-CM | POA: Diagnosis not present

## 2023-06-04 DIAGNOSIS — D225 Melanocytic nevi of trunk: Secondary | ICD-10-CM | POA: Diagnosis not present

## 2023-06-04 DIAGNOSIS — Z85820 Personal history of malignant melanoma of skin: Secondary | ICD-10-CM | POA: Diagnosis not present

## 2023-06-04 DIAGNOSIS — D485 Neoplasm of uncertain behavior of skin: Secondary | ICD-10-CM | POA: Diagnosis not present

## 2023-06-04 DIAGNOSIS — Z808 Family history of malignant neoplasm of other organs or systems: Secondary | ICD-10-CM | POA: Diagnosis not present

## 2023-06-04 DIAGNOSIS — L821 Other seborrheic keratosis: Secondary | ICD-10-CM | POA: Diagnosis not present

## 2023-06-04 DIAGNOSIS — L570 Actinic keratosis: Secondary | ICD-10-CM | POA: Diagnosis not present

## 2023-06-04 DIAGNOSIS — Z85828 Personal history of other malignant neoplasm of skin: Secondary | ICD-10-CM | POA: Diagnosis not present

## 2023-06-05 ENCOUNTER — Ambulatory Visit: Admitting: *Deleted

## 2023-06-05 ENCOUNTER — Other Ambulatory Visit: Payer: Self-pay | Admitting: Family

## 2023-06-05 DIAGNOSIS — I10 Essential (primary) hypertension: Secondary | ICD-10-CM

## 2023-06-05 DIAGNOSIS — M25662 Stiffness of left knee, not elsewhere classified: Secondary | ICD-10-CM

## 2023-06-05 DIAGNOSIS — G8929 Other chronic pain: Secondary | ICD-10-CM

## 2023-06-05 DIAGNOSIS — M25562 Pain in left knee: Secondary | ICD-10-CM | POA: Diagnosis not present

## 2023-06-05 NOTE — Therapy (Signed)
 OUTPATIENT PHYSICAL THERAPY LOWER EXTREMITY TREATMENT    Patient Name: Grant Cummings. MRN: [id number] DOB:03/16/1945, 78 y.o., male Today's Date: 06/05/2023  END OF SESSION:  PT End of Session - 06/05/23 0802     Visit Number 6    Number of Visits 12    Date for PT Re-Evaluation 06/09/23    PT Start Time 0800    PT Stop Time 0902    PT Time Calculation (min) 62 min    Activity Tolerance Patient tolerated treatment well    Behavior During Therapy WFL for tasks assessed/performed               Past Medical History:  Diagnosis Date   Anemia    Arthritis    Asbestos exposure    Cataract    GERD (gastroesophageal reflux disease)    Hyperlipidemia    Hypertension    Liver cancer (HCC)    cholangeocarcinoma   PE (pulmonary thromboembolism) (HCC)    Saddle pulmonary embolus (HCC)    Past Surgical History:  Procedure Laterality Date   APPENDECTOMY     CARPAL TUNNEL WITH CUBITAL TUNNEL Right    CHOLECYSTECTOMY     EYE SURGERY Left 2017   lens implant to correct stigmatism / cataract   KNEE SURGERY     left   MELANOMA EXCISION Left 01/17/2020   partial liver removal     x 3   RADIOFREQUENCY ABLATION LIVER TUMOR     with biopsy  - NEG   TOTAL KNEE ARTHROPLASTY Left 07/16/2021   Procedure: TOTAL KNEE ARTHROPLASTY;  Surgeon: Liliane Rei, MD;  Location: [facility];  Service: Orthopedics;  Laterality: Left;   Patient Active Problem List   Diagnosis Date Noted   OA (osteoarthritis) of knee 07/16/2021   Osteoarthritis of left knee 07/16/2021   Personal history of PE (pulmonary embolism) 08/30/2017   Blood type A- 05/08/2015   Hernia of abdominal wall 05/08/2015   Edema 11/22/2014   BPH (benign prostatic hyperplasia) 03/22/2013   Vitamin D  deficiency 03/22/2013   Hearing deficit 03/22/2013   Liver cancer (HCC)    Hyperlipidemia    Hypertension     REFERRING PROVIDER: Liliane Rei MD  REFERRING DIAG: H/o of left total knee replacement  THERAPY DIAG:   Chronic pain of left knee  Stiffness of left knee, not elsewhere classified  Rationale for Evaluation and Treatment: Rehabilitation  ONSET DATE: Ongoing.    SUBJECTIVE:   SUBJECTIVE STATEMENT:   Patient denies any pain.   PERTINENT HISTORY: Left TKA.  Prior left quad injury. PAIN:  Are you having pain? No  PRECAUTIONS: Other: No ultrasound over left knee.    RED FLAGS: None   WEIGHT BEARING RESTRICTIONS: No  FALLS:  Has patient fallen in last 6 months? Yes. Number of falls Related to haying (800# round bale).    LIVING ENVIRONMENT: Lives with: lives with their spouse Lives in: House/apartment Stairs:  Ramp and stairs with railing.   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    PLOF: Independent  PATIENT GOALS: To be able to walk more for enjoyment and quality of life.      OBJECTIVE:  Note: Objective measures were completed at Evaluation unless otherwise noted.  PATIENT SURVEYS:  LEFS 23/80.  EDEMA:  No edema noted.  PALPATION: C/o palpable pain over left knee incision site and over left quadriceps to approximately the mid-portion of the musculature.  LOWER EXTREMITY ROM:  The patient's left knee extension is -15 degrees (he states it has been like this for years).  His flexion is 110 degrees.    LOWER EXTREMITY MMT:  Normal left knee and hip strength.  GAIT: Mild gait antalgia noted with left knee held in flexion.                                                                                                                                 TREATMENT DATE:                                    06/05/23 EXERCISE LOG  Exercise Repetitions and Resistance Comments  Nustep  L4 x 15 minutes   Recumbent Bike L2 x 5 mins   Cybex knee extension 20# x 3 minutes   Cybex knee flexion  40# x 3 minutes   Cybex Leg Press 2 plate; seat 7; 3 mins   Rocker board  5 minutes    Step up  BOSU (ball up)  x 3 minutes alternating Leading with LLE   Side-stepping Airex x 3.5 mins   Tandem Gait Airex x 3.5 mins   Sit to stand  20 reps no UE support Without UE support   Blank cell = exercise not performed today                                    05/23/23 EXERCISE LOG  Exercise Repetitions and Resistance Comments  Nustep  L4-5 x 15 minutes    Step up  6" step x 3 minutes   Eccentric step down  6" step x 2 minutes  For LLE only   LAQ  4# x 3.5 minutes w/ 5 second hold   Rocker board  5 minutes     Blank cell = exercise not performed today              05/19/23   EXERCISE LOG  Exercise Repetitions and Resistance Comments  Nu step  15 minutes level 4   Arm support level 6, chair at 10   Forward Step Ups  8" box 3 sets of 12 reps   TKE Standing  3 sets 1 minute    Heel Raises  3 sets of 12 reps    LAQ  2# 1 set of 12; 3# 2 set of 12  (Both sides)            Blank cell = exercise not performed today   Manual Therapy   Soft Tissue Mobilization: left knee, patellar tendon and quadriceps muscle in order to decrease pain and stiffness. Patient reported improved mobility, decreased stiffness and no adverse effects upon completion of this element of treatment session.     05/12/23:   HMP and  IFC at 80-150 Hz on 40% scan x 20 minutes to affected left quadriceps.   Normal modality response following removal of modality.  PATIENT EDUCATION:  Education details: healing and HEP  Person educated: patient  Education method: verbal Education comprehension: patient reported understanding   HOME EXERCISE PROGRAM: [email or web address]  Access Code: [email or web address]  ASSESSMENT:  CLINICAL IMPRESSION:  Pt arrives for today's treatment session denying any pain.  Pt is making good progress towards all of his goals at this time. Pt able to tolerate increased time or reps with all previously performed exercises.  Pt introduced to tandem gait and side-stepping on Airex balance beam today with  intermittent use of UE support for safety and stability.  Pt able to perform all STS transfers without use of UE support.  Pt denied any pain at completion of today's treatment session.   OBJECTIVE IMPAIRMENTS: decreased activity tolerance, decreased ROM, increased muscle spasms, and pain.   ACTIVITY LIMITATIONS: carrying, lifting, and stairs  PARTICIPATION LIMITATIONS: cleaning and yard work  PERSONAL FACTORS: Time since onset of injury/illness/exacerbation and 1 comorbidity: prior left quad injury  are also affecting patient's functional outcome.   REHAB POTENTIAL: Good  CLINICAL DECISION MAKING: Stable/uncomplicated  EVALUATION COMPLEXITY: Low   GOALS:  LONG TERM GOALS: Target date: 06/09/23  Ind with a HEP.  Goal status: IN PROGRESS  2.  Go from sitting to standing and walk with left knee pain not > 3/10.  Goal status: IN PROGRESS  3.  Ascend and descend stairs with left knee pain not > 3/10.    Goal status: IN PROGRESS  4.  Walk a community distance with pain not > 3/10.  Goal status: IN PROGRESS   PLAN:  PT FREQUENCY:  2-3 times a week.  PT DURATION: 4 weeks  PLANNED INTERVENTIONS: 97110-Therapeutic exercises, 97530- Therapeutic activity, [id number]- Neuromuscular re-education, 97535- Self Care, [id number]- Manual therapy, G0283- Electrical stimulation (unattended), 97016- Vasopneumatic device, Stair training, Cryotherapy, and Moist heat  PLAN FOR NEXT SESSION: Nustep, progress hip, knee and lower leg muscle strengthening exercises, continue functional mobility exercises, introduce balance exercises. Soft tissue work to left quadriceps and patellar tendon. Modalities as needed   Deryl Flora, PTA 06/05/2023, 9:09 AM

## 2023-06-06 ENCOUNTER — Ambulatory Visit

## 2023-06-06 DIAGNOSIS — G8929 Other chronic pain: Secondary | ICD-10-CM

## 2023-06-06 DIAGNOSIS — M25662 Stiffness of left knee, not elsewhere classified: Secondary | ICD-10-CM

## 2023-06-06 DIAGNOSIS — M25562 Pain in left knee: Secondary | ICD-10-CM | POA: Diagnosis not present

## 2023-06-06 NOTE — Therapy (Signed)
 OUTPATIENT PHYSICAL THERAPY LOWER EXTREMITY TREATMENT    Patient Name: Grant Cummings. MRN: [id number] DOB:March 07, 1945, 78 y.o., male Today's Date: 06/06/2023  END OF SESSION:  PT End of Session - 06/06/23 1016     Visit Number 7    Number of Visits 12    Date for PT Re-Evaluation 06/09/23    PT Start Time 1015    PT Stop Time 1058    PT Time Calculation (min) 43 min    Activity Tolerance Patient tolerated treatment well    Behavior During Therapy WFL for tasks assessed/performed               Past Medical History:  Diagnosis Date   Anemia    Arthritis    Asbestos exposure    Cataract    GERD (gastroesophageal reflux disease)    Hyperlipidemia    Hypertension    Liver cancer (HCC)    cholangeocarcinoma   PE (pulmonary thromboembolism) (HCC)    Saddle pulmonary embolus (HCC)    Past Surgical History:  Procedure Laterality Date   APPENDECTOMY     CARPAL TUNNEL WITH CUBITAL TUNNEL Right    CHOLECYSTECTOMY     EYE SURGERY Left 2017   lens implant to correct stigmatism / cataract   KNEE SURGERY     left   MELANOMA EXCISION Left 01/17/2020   partial liver removal     x 3   RADIOFREQUENCY ABLATION LIVER TUMOR     with biopsy  - NEG   TOTAL KNEE ARTHROPLASTY Left 07/16/2021   Procedure: TOTAL KNEE ARTHROPLASTY;  Surgeon: Liliane Rei, MD;  Location: [facility];  Service: Orthopedics;  Laterality: Left;   Patient Active Problem List   Diagnosis Date Noted   OA (osteoarthritis) of knee 07/16/2021   Osteoarthritis of left knee 07/16/2021   Personal history of PE (pulmonary embolism) 08/30/2017   Blood type A- 05/08/2015   Hernia of abdominal wall 05/08/2015   Edema 11/22/2014   BPH (benign prostatic hyperplasia) 03/22/2013   Vitamin D  deficiency 03/22/2013   Hearing deficit 03/22/2013   Liver cancer (HCC)    Hyperlipidemia    Hypertension     REFERRING PROVIDER: Liliane Rei MD  REFERRING DIAG: H/o of left total knee replacement  THERAPY DIAG:   Chronic pain of left knee  Stiffness of left knee, not elsewhere classified  Rationale for Evaluation and Treatment: Rehabilitation  ONSET DATE: Ongoing.    SUBJECTIVE:   SUBJECTIVE STATEMENT:   Patient reports 2-3/10 left knee pain today.   PERTINENT HISTORY: Left TKA.  Prior left quad injury. PAIN:  Are you having pain? Yes: NPRS scale: 2-3/10  Pain location: left knee  PRECAUTIONS: Other: No ultrasound over left knee.    RED FLAGS: None   WEIGHT BEARING RESTRICTIONS: No  FALLS:  Has patient fallen in last 6 months? Yes. Number of falls Related to haying (800# round bale).    LIVING ENVIRONMENT: Lives with: lives with their spouse Lives in: House/apartment Stairs:  Ramp and stairs with railing.   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    PLOF: Independent  PATIENT GOALS: To be able to walk more for enjoyment and quality of life.      OBJECTIVE:  Note: Objective measures were completed at Evaluation unless otherwise noted.  PATIENT SURVEYS:  LEFS 23/80.  EDEMA:  No edema noted.  PALPATION: C/o palpable pain over left knee incision site and over  left quadriceps to approximately the mid-portion of the musculature.    LOWER EXTREMITY ROM:  The patient's left knee extension is -15 degrees (he states it has been like this for years).  His flexion is 110 degrees.    LOWER EXTREMITY MMT:  Normal left knee and hip strength.  GAIT: Mild gait antalgia noted with left knee held in flexion.                                                                                                                                 TREATMENT DATE:                                    06/06/23 EXERCISE LOG  Exercise Repetitions and Resistance Comments  Nustep     Recumbent Bike L3 x 15 mins   Cybex knee extension 20# x 4 minutes   Cybex knee flexion  40# x 4 minutes   Cybex Leg Press 2 plate; seat 7; 4 mins    Rocker board  5 minutes    Step up  BOSU (ball up) x 3 minutes alternating Leading with LLE   Side-stepping Airex x 3.5 mins   Tandem Gait Airex x 3.5 mins   Sit to stand   Without UE support   Blank cell = exercise not performed today                                    05/23/23 EXERCISE LOG  Exercise Repetitions and Resistance Comments  Nustep  L4-5 x 15 minutes    Step up  6" step x 3 minutes   Eccentric step down  6" step x 2 minutes  For LLE only   LAQ  4# x 3.5 minutes w/ 5 second hold   Rocker board  5 minutes     Blank cell = exercise not performed today              05/19/23   EXERCISE LOG  Exercise Repetitions and Resistance Comments  Nu step  15 minutes level 4   Arm support level 6, chair at 10   Forward Step Ups  8" box 3 sets of 12 reps   TKE Standing  3 sets 1 minute    Heel Raises  3 sets of 12 reps    LAQ  2# 1 set of 12; 3# 2 set of 12  (Both sides)            Blank cell = exercise not performed today   Manual Therapy   Soft Tissue Mobilization: left knee, patellar tendon and quadriceps muscle in order to decrease pain and stiffness. Patient reported improved mobility, decreased stiffness and no adverse effects upon completion of this element of treatment session.  05/12/23:   HMP and IFC at 80-150 Hz on 40% scan x 20 minutes to affected left quadriceps.   Normal modality response following removal of modality.  PATIENT EDUCATION:  Education details: healing and HEP  Person educated: patient  Education method: verbal Education comprehension: patient reported understanding   HOME EXERCISE PROGRAM: [email or web address]  Access Code: [email or web address]  ASSESSMENT:  CLINICAL IMPRESSION:  Pt arrives for today's treatment session reporting 2-3/10 left knee pain.  Pt reports increased soreness after yesterday's treatment session.  Pt able to tolerate increased time with all cybex exercises today.  Pt reports feeling "pretty good" at completion of  today's treatment session.   OBJECTIVE IMPAIRMENTS: decreased activity tolerance, decreased ROM, increased muscle spasms, and pain.   ACTIVITY LIMITATIONS: carrying, lifting, and stairs  PARTICIPATION LIMITATIONS: cleaning and yard work  PERSONAL FACTORS: Time since onset of injury/illness/exacerbation and 1 comorbidity: prior left quad injury  are also affecting patient's functional outcome.   REHAB POTENTIAL: Good  CLINICAL DECISION MAKING: Stable/uncomplicated  EVALUATION COMPLEXITY: Low   GOALS:  LONG TERM GOALS: Target date: 06/09/23  Ind with a HEP.  Goal status: IN PROGRESS  2.  Go from sitting to standing and walk with left knee pain not > 3/10.  Goal status: IN PROGRESS  3.  Ascend and descend stairs with left knee pain not > 3/10.    Goal status: IN PROGRESS  4.  Walk a community distance with pain not > 3/10.  Goal status: IN PROGRESS   PLAN:  PT FREQUENCY:  2-3 times a week.  PT DURATION: 4 weeks  PLANNED INTERVENTIONS: 97110-Therapeutic exercises, 97530- Therapeutic activity, [id number]- Neuromuscular re-education, 97535- Self Care, 13244- Manual therapy, G0283- Electrical stimulation (unattended), 97016- Vasopneumatic device, Stair training, Cryotherapy, and Moist heat  PLAN FOR NEXT SESSION: Nustep, progress hip, knee and lower leg muscle strengthening exercises, continue functional mobility exercises, introduce balance exercises. Soft tissue work to left quadriceps and patellar tendon. Modalities as needed   Deryl Flora, PTA 06/06/2023, 11:12 AM

## 2023-06-17 ENCOUNTER — Ambulatory Visit: Attending: Orthopedic Surgery | Admitting: *Deleted

## 2023-06-17 ENCOUNTER — Encounter: Payer: Self-pay | Admitting: *Deleted

## 2023-06-17 DIAGNOSIS — M25562 Pain in left knee: Secondary | ICD-10-CM | POA: Insufficient documentation

## 2023-06-17 DIAGNOSIS — M25662 Stiffness of left knee, not elsewhere classified: Secondary | ICD-10-CM | POA: Diagnosis not present

## 2023-06-17 DIAGNOSIS — G8929 Other chronic pain: Secondary | ICD-10-CM | POA: Diagnosis not present

## 2023-06-17 NOTE — Therapy (Signed)
 OUTPATIENT PHYSICAL THERAPY LOWER EXTREMITY TREATMENT    Patient Name: Grant Cummings. MRN: [id number] DOB:25-Jun-1945, 78 y.o., male Today's Date: 06/17/2023  END OF SESSION:  PT End of Session - 06/17/23 0815     Visit Number 8    Number of Visits 12    Date for PT Re-Evaluation 06/09/23    PT Start Time 0805    PT Stop Time 0852    PT Time Calculation (min) 47 min               Past Medical History:  Diagnosis Date   Anemia    Arthritis    Asbestos exposure    Cataract    GERD (gastroesophageal reflux disease)    Hyperlipidemia    Hypertension    Liver cancer (HCC)    cholangeocarcinoma   PE (pulmonary thromboembolism) (HCC)    Saddle pulmonary embolus (HCC)    Past Surgical History:  Procedure Laterality Date   APPENDECTOMY     CARPAL TUNNEL WITH CUBITAL TUNNEL Right    CHOLECYSTECTOMY     EYE SURGERY Left 2017   lens implant to correct stigmatism / cataract   KNEE SURGERY     left   MELANOMA EXCISION Left 01/17/2020   partial liver removal     x 3   RADIOFREQUENCY ABLATION LIVER TUMOR     with biopsy  - NEG   TOTAL KNEE ARTHROPLASTY Left 07/16/2021   Procedure: TOTAL KNEE ARTHROPLASTY;  Surgeon: Liliane Rei, MD;  Location: [facility];  Service: Orthopedics;  Laterality: Left;   Patient Active Problem List   Diagnosis Date Noted   OA (osteoarthritis) of knee 07/16/2021   Osteoarthritis of left knee 07/16/2021   Personal history of PE (pulmonary embolism) 08/30/2017   Blood type A- 05/08/2015   Hernia of abdominal wall 05/08/2015   Edema 11/22/2014   BPH (benign prostatic hyperplasia) 03/22/2013   Vitamin D  deficiency 03/22/2013   Hearing deficit 03/22/2013   Liver cancer (HCC)    Hyperlipidemia    Hypertension     REFERRING PROVIDER: Liliane Rei MD  REFERRING DIAG: H/o of left total knee replacement  THERAPY DIAG:  Chronic pain of left knee  Stiffness of left knee, not elsewhere classified  Rationale for Evaluation and Treatment:  Rehabilitation  ONSET DATE: Ongoing.    SUBJECTIVE:   SUBJECTIVE STATEMENT:   Patient reports 2-3/10 left knee pain today. Doing good  PERTINENT HISTORY: Left TKA.  Prior left quad injury. PAIN:  Are you having pain? Yes: NPRS scale: 2-3/10  Pain location: left knee  PRECAUTIONS: Other: No ultrasound over left knee.    RED FLAGS: None   WEIGHT BEARING RESTRICTIONS: No  FALLS:  Has patient fallen in last 6 months? Yes. Number of falls Related to haying (800# round bale).    LIVING ENVIRONMENT: Lives with: lives with their spouse Lives in: House/apartment Stairs:  Ramp and stairs with railing.   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    PLOF: Independent  PATIENT GOALS: To be able to walk more for enjoyment and quality of life.      OBJECTIVE:  Note: Objective measures were completed at Evaluation unless otherwise noted.  PATIENT SURVEYS:  LEFS 23/80.  EDEMA:  No edema noted.  PALPATION: C/o palpable pain over left knee incision site and over left quadriceps to approximately the mid-portion of the musculature.    LOWER EXTREMITY ROM:  The patient's  left knee extension is -15 degrees (he states it has been like this for years).  His flexion is 110 degrees.    LOWER EXTREMITY MMT:  Normal left knee and hip strength.  GAIT: Mild gait antalgia noted with left knee held in flexion.                                                                                                                                 TREATMENT DATE:                                     06/17/23                                                            EXERCISE LOG   LT knee  Exercise Repetitions and Resistance Comments  Nustep  L3 x 15 mins   Recumbent Bike    Cybex knee extension 20# x 4 minutes   Cybex knee flexion  40# x 4 minutes   Cybex Leg Press    Rocker board  6 minutes    Step up   Leading with LLE    Side-stepping    Tandem Gait    Sit to stand   Without UE support  Step down 3x10   focus on quad control   Split squat on 14 in box 3x10   focus on quad control`    Blank cell = exercise not performed today                                    05/23/23 EXERCISE LOG  Exercise Repetitions and Resistance Comments  Nustep  L4-5 x 15 minutes    Step up  6" step x 3 minutes   Eccentric step down  6" step x 2 minutes  For LLE only   LAQ  4# x 3.5 minutes w/ 5 second hold   Rocker board  5 minutes     Blank cell = exercise not performed today              05/19/23   EXERCISE LOG  Exercise Repetitions and Resistance Comments  Nu step  15 minutes level 4   Arm support level 6, chair at 10   Forward Step Ups  8" box 3 sets of 12 reps   TKE Standing  3 sets 1 minute    Heel Raises  3 sets of 12 reps    LAQ  2# 1 set of 12; 3# 2 set of 12  (Both sides)  Blank cell = exercise not performed today   Manual Therapy   Soft Tissue Mobilization: left knee, patellar tendon and quadriceps muscle in order to decrease pain and stiffness. Patient reported improved mobility, decreased stiffness and no adverse effects upon completion of this element of treatment session.     05/12/23:   HMP and IFC at 80-150 Hz on 40% scan x 20 minutes to affected left quadriceps.   Normal modality response following removal of modality.  PATIENT EDUCATION:  Education details: healing and HEP  Person educated: patient  Education method: verbal Education comprehension: patient reported understanding   HOME EXERCISE PROGRAM: [email or web address]  Access Code: [email or web address]  ASSESSMENT:  CLINICAL IMPRESSION:  Pt arrives for today's treatment session reporting 2-3/10 left knee pain.  Rx focused on strengthening and quad control CKC and OKC exs for LT LE and did great.   OBJECTIVE IMPAIRMENTS: decreased activity tolerance, decreased ROM, increased muscle spasms, and pain.   ACTIVITY  LIMITATIONS: carrying, lifting, and stairs  PARTICIPATION LIMITATIONS: cleaning and yard work  PERSONAL FACTORS: Time since onset of injury/illness/exacerbation and 1 comorbidity: prior left quad injury  are also affecting patient's functional outcome.   REHAB POTENTIAL: Good  CLINICAL DECISION MAKING: Stable/uncomplicated  EVALUATION COMPLEXITY: Low   GOALS:  LONG TERM GOALS: Target date: 06/09/23  Ind with a HEP.  Goal status: IN PROGRESS  2.  Go from sitting to standing and walk with left knee pain not > 3/10.  Goal status: IN PROGRESS  3.  Ascend and descend stairs with left knee pain not > 3/10.    Goal status: IN PROGRESS  4.  Walk a community distance with pain not > 3/10.  Goal status: IN PROGRESS   PLAN:  PT FREQUENCY:  2-3 times a week.  PT DURATION: 4 weeks  PLANNED INTERVENTIONS: 97110-Therapeutic exercises, 97530- Therapeutic activity, [id number]- Neuromuscular re-education, 97535- Self Care, 08657- Manual therapy, G0283- Electrical stimulation (unattended), 97016- Vasopneumatic device, Stair training, Cryotherapy, and Moist heat  PLAN FOR NEXT SESSION: Nustep, progress hip, knee and lower leg muscle strengthening exercises, continue functional mobility exercises, introduce balance exercises. Soft tissue work to left quadriceps and patellar tendon. Modalities as needed   Rynn Markiewicz,CHRIS, PTA 06/17/2023, 9:59 AM

## 2023-06-19 ENCOUNTER — Ambulatory Visit: Payer: PPO | Admitting: Family Medicine

## 2023-06-19 ENCOUNTER — Encounter: Payer: Self-pay | Admitting: Family Medicine

## 2023-06-19 VITALS — BP 150/79 | HR 62 | Ht 74.0 in | Wt 233.0 lb

## 2023-06-19 DIAGNOSIS — N400 Enlarged prostate without lower urinary tract symptoms: Secondary | ICD-10-CM | POA: Diagnosis not present

## 2023-06-19 DIAGNOSIS — E782 Mixed hyperlipidemia: Secondary | ICD-10-CM

## 2023-06-19 DIAGNOSIS — Z86711 Personal history of pulmonary embolism: Secondary | ICD-10-CM | POA: Diagnosis not present

## 2023-06-19 DIAGNOSIS — K219 Gastro-esophageal reflux disease without esophagitis: Secondary | ICD-10-CM | POA: Diagnosis not present

## 2023-06-19 DIAGNOSIS — I10 Essential (primary) hypertension: Secondary | ICD-10-CM | POA: Diagnosis not present

## 2023-06-19 DIAGNOSIS — R0609 Other forms of dyspnea: Secondary | ICD-10-CM | POA: Diagnosis not present

## 2023-06-19 LAB — LIPID PANEL

## 2023-06-19 MED ORDER — CARVEDILOL 12.5 MG PO TABS: 12.5000 mg | ORAL_TABLET | Freq: Two times a day (BID) | ORAL | 3 refills | Status: AC

## 2023-06-19 MED ORDER — PANTOPRAZOLE SODIUM 40 MG PO TBEC: 40.0000 mg | DELAYED_RELEASE_TABLET | Freq: Every day | ORAL | 3 refills | Status: AC

## 2023-06-19 MED ORDER — AMLODIPINE BESYLATE 10 MG PO TABS: 10.0000 mg | ORAL_TABLET | Freq: Every day | ORAL | 3 refills | Status: AC

## 2023-06-19 MED ORDER — POTASSIUM CHLORIDE CRYS ER 10 MEQ PO TBCR: 10.0000 meq | EXTENDED_RELEASE_TABLET | Freq: Every day | ORAL | 3 refills | Status: AC

## 2023-06-19 MED ORDER — FLUTICASONE PROPIONATE 50 MCG/ACT NA SUSP
1.0000 | Freq: Every day | NASAL | 2 refills | Status: AC | PRN
Start: 2023-06-19 — End: ?

## 2023-06-19 MED ORDER — HYDROCHLOROTHIAZIDE 25 MG PO TABS: 25.0000 mg | ORAL_TABLET | Freq: Every day | ORAL | 3 refills | Status: AC

## 2023-06-19 MED ORDER — APIXABAN 5 MG PO TABS: 5.0000 mg | ORAL_TABLET | Freq: Two times a day (BID) | ORAL | 3 refills | Status: AC

## 2023-06-19 NOTE — Progress Notes (Signed)
 BP (!) 150/79   Pulse 62   Ht 6\' 2"  (1.88 m)   Wt 233 lb (105.7 kg)   SpO2 95%   BMI 29.92 kg/m    Subjective:   Patient ID: Grant [place]., male    DOB: 03/27/1945, 78 y.o.   MRN: [id number]  HPI: Grant Kelsch. is a 78 y.o. male presenting on 06/19/2023 for Medical Management of Chronic Issues, Hyperlipidemia, and Hypertension   HPI Hypertension Patient is currently on hydrochlorothiazide  and amlodipine  and carvedilol , and their blood pressure today is 150/79. Patient denies any lightheadedness or dizziness. Patient denies headaches, blurred vision, chest pains, shortness of breath, or weakness. Denies any side effects from medication and is content with current medication.  Patient's only real big complaint is that he still getting some exertional dyspnea.  He denies any chest pain or palpitations or flutters but does still feel some wide short winded especially going up stairs.  Hyperlipidemia Patient is coming in for recheck of his hyperlipidemia. The patient is currently taking no medicine currently, trying for diet control. They deny any issues with myalgias or history of liver damage from it. They deny any focal numbness or weakness or chest pain.   History of PE Patient has a history of PE.  Currently takes Eliquis .  Denies any bruising or bleeding outside of the normal.  Relevant past medical, surgical, family and social history reviewed and updated as indicated. Interim medical history since our last visit reviewed. Allergies and medications reviewed and updated.  Review of Systems  Constitutional:  Negative for chills and fever.  Eyes:  Negative for visual disturbance.  Respiratory:  Positive for shortness of breath (On exertion). Negative for wheezing.   Cardiovascular:  Negative for chest pain and leg swelling.  Musculoskeletal:  Negative for back pain and gait problem.  Skin:  Negative for rash.  Neurological:  Negative for dizziness, weakness and  light-headedness.  All other systems reviewed and are negative.   Per HPI unless specifically indicated above   Allergies as of 06/19/2023       Reactions   Nsaids Other (See Comments)   Stomach bleed from radiation        Medication List        Accurate as of Jun 19, 2023 11:03 AM. If you have any questions, ask your nurse or doctor.          STOP taking these medications    Blue-Emu Super Strength Crea Stopped by: Lucio Sabin Ashliegh Parekh       TAKE these medications    acetaminophen  500 MG tablet Commonly known as: TYLENOL  Take 500-1,000 mg by mouth every 6 (six) hours as needed (for knee pain.).   Allergy Eye 0.025-0.3 % ophthalmic solution Generic drug: naphazoline-pheniramine 1-2 drops 4 (four) times daily as needed for eye irritation or allergies.   amLODipine  10 MG tablet Commonly known as: NORVASC  Take 1 tablet (10 mg total) by mouth daily. What changed: how much to take Changed by: Lucio Sabin Alexandra Lipps   apixaban  5 MG Tabs tablet Commonly known as: Eliquis  Take 1 tablet (5 mg total) by mouth 2 (two) times daily.   carvedilol  12.5 MG tablet Commonly known as: COREG  Take 1 tablet (12.5 mg total) by mouth 2 (two) times daily with a meal.   cholecalciferol 1000 units tablet Commonly known as: VITAMIN D  Take 1,000 Units by mouth daily.   fluticasone  50 MCG/ACT nasal spray Commonly known as: FLONASE  Place 1-2 sprays into  both nostrils daily as needed for allergies or rhinitis.   hydrochlorothiazide  25 MG tablet Commonly known as: HYDRODIURIL  Take 1 tablet (25 mg total) by mouth daily.   multivitamin with minerals Tabs tablet Take 1 tablet by mouth every evening.   pantoprazole  40 MG tablet Commonly known as: PROTONIX  Take 1 tablet (40 mg total) by mouth daily.   potassium chloride  10 MEQ tablet Commonly known as: KLOR-CON  M Take 1 tablet (10 mEq total) by mouth daily.   tamsulosin  0.4 MG Caps capsule Commonly known as: FLOMAX  Take 1 capsule  (0.4 mg total) by mouth daily.         Objective:   BP (!) 150/79   Pulse 62   Ht 6\' 2"  (1.88 m)   Wt 233 lb (105.7 kg)   SpO2 95%   BMI 29.92 kg/m   Wt Readings from Last 3 Encounters:  06/19/23 233 lb (105.7 kg)  02/26/23 232 lb (105.2 kg)  12/20/22 232 lb (105.2 kg)    Physical Exam Vitals and nursing note reviewed.  Constitutional:      General: He is not in acute distress.    Appearance: He is well-developed. He is not diaphoretic.  Eyes:     General: No scleral icterus.    Conjunctiva/sclera: Conjunctivae normal.  Neck:     Thyroid: No thyromegaly.  Cardiovascular:     Rate and Rhythm: Normal rate and regular rhythm.     Heart sounds: Normal heart sounds. No murmur heard. Pulmonary:     Effort: Pulmonary effort is normal. No respiratory distress.     Breath sounds: Normal breath sounds. No wheezing.  Musculoskeletal:        General: Swelling (Trace bilateral lower extremity) present. Normal range of motion.     Cervical back: Neck supple.  Lymphadenopathy:     Cervical: No cervical adenopathy.  Skin:    General: Skin is warm and dry.     Findings: No rash.  Neurological:     Mental Status: He is alert and oriented to person, place, and time.     Coordination: Coordination normal.  Psychiatric:        Behavior: Behavior normal.     Results for orders placed or performed in visit on 01/06/23  HM DIABETES EYE EXAM   Collection Time: 12/23/22 12:00 AM  Result Value Ref Range   HM Diabetic Eye Exam No Retinopathy No Retinopathy    Assessment & Plan:   Problem List Items Addressed This Visit       Cardiovascular and Mediastinum   Hypertension   Relevant Medications   amLODipine  (NORVASC ) 10 MG tablet   apixaban  (ELIQUIS ) 5 MG TABS tablet   carvedilol  (COREG ) 12.5 MG tablet   hydrochlorothiazide  (HYDRODIURIL ) 25 MG tablet   potassium chloride  (KLOR-CON  M) 10 MEQ tablet     Genitourinary   BPH (benign prostatic hyperplasia)     Other    Hyperlipidemia - Primary   Relevant Medications   amLODipine  (NORVASC ) 10 MG tablet   apixaban  (ELIQUIS ) 5 MG TABS tablet   carvedilol  (COREG ) 12.5 MG tablet   hydrochlorothiazide  (HYDRODIURIL ) 25 MG tablet   Other Relevant Orders   CBC with Differential/Platelet   CMP14+EGFR   Lipid panel   Personal history of PE (pulmonary embolism)   Other Visit Diagnoses       Essential hypertension       Relevant Medications   amLODipine  (NORVASC ) 10 MG tablet   apixaban  (ELIQUIS ) 5 MG TABS tablet  carvedilol  (COREG ) 12.5 MG tablet   hydrochlorothiazide  (HYDRODIURIL ) 25 MG tablet   potassium chloride  (KLOR-CON  M) 10 MEQ tablet   Other Relevant Orders   CMP14+EGFR     Gastroesophageal reflux disease without esophagitis       Relevant Medications   pantoprazole  (PROTONIX ) 40 MG tablet   Other Relevant Orders   CBC with Differential/Platelet     Exertional dyspnea       Relevant Orders   Ambulatory referral to Cardiology     Patient still having some exertional dyspnea, likely still from recovering from his surgeries including knee replacement.  Will refer him to cardiology just to make sure that there is nothing else that is going on there.  Blood pressure elevated today but he checks it at home and has been running well at home  Follow up plan: Return in about 6 months (around 12/20/2023), or if symptoms worsen or fail to improve, for Hypertension and hyperlipidemia.  Counseling provided for all of the vaccine components Orders Placed This Encounter  Procedures   CBC with Differential/Platelet   CMP14+EGFR   Lipid panel   Ambulatory referral to Cardiology    Grant Needs, MD [facility] 06/19/2023, 11:03 AM

## 2023-06-20 ENCOUNTER — Ambulatory Visit: Admitting: *Deleted

## 2023-06-20 DIAGNOSIS — M25562 Pain in left knee: Secondary | ICD-10-CM | POA: Diagnosis not present

## 2023-06-20 DIAGNOSIS — M25662 Stiffness of left knee, not elsewhere classified: Secondary | ICD-10-CM

## 2023-06-20 DIAGNOSIS — G8929 Other chronic pain: Secondary | ICD-10-CM

## 2023-06-20 LAB — CMP14+EGFR
ALT: 30 IU/L (ref 0–44)
AST: 27 IU/L (ref 0–40)
Albumin: 4.2 g/dL (ref 3.8–4.8)
Alkaline Phosphatase: 66 IU/L (ref 44–121)
BUN/Creatinine Ratio: 14 (ref 10–24)
BUN: 17 mg/dL (ref 8–27)
Bilirubin Total: 0.5 mg/dL (ref 0.0–1.2)
CO2: 26 mmol/L (ref 20–29)
Calcium: 9.3 mg/dL (ref 8.6–10.2)
Chloride: 101 mmol/L (ref 96–106)
Creatinine, Ser: 1.23 mg/dL (ref 0.76–1.27)
Globulin, Total: 2.4 g/dL (ref 1.5–4.5)
Glucose: 117 mg/dL — ABNORMAL HIGH (ref 70–99)
Potassium: 4.2 mmol/L (ref 3.5–5.2)
Sodium: 140 mmol/L (ref 134–144)
Total Protein: 6.6 g/dL (ref 6.0–8.5)
eGFR: 60 mL/min/{1.73_m2} (ref >59)

## 2023-06-20 LAB — CBC WITH DIFFERENTIAL/PLATELET
Basophils Absolute: 0 10*3/uL (ref 0.0–0.2)
Basos: 1 %
EOS (ABSOLUTE): 0.1 10*3/uL (ref 0.0–0.4)
Eos: 2 %
Hematocrit: 41.8 % (ref 37.5–51.0)
Hemoglobin: 13.6 g/dL (ref 13.0–17.7)
Immature Grans (Abs): 0 10*3/uL (ref 0.0–0.1)
Immature Granulocytes: 1 %
Lymphocytes Absolute: 0.7 10*3/uL (ref 0.7–3.1)
Lymphs: 11 %
MCH: 30.4 pg (ref 26.6–33.0)
MCHC: 32.5 g/dL (ref 31.5–35.7)
MCV: 93 fL (ref 79–97)
Monocytes Absolute: 0.6 10*3/uL (ref 0.1–0.9)
Monocytes: 10 %
Neutrophils Absolute: 5 10*3/uL (ref 1.4–7.0)
Neutrophils: 75 %
Platelets: 202 10*3/uL (ref 150–450)
RBC: 4.48 x10E6/uL (ref 4.14–5.80)
RDW: 14 % (ref 11.6–15.4)
WBC: 6.6 10*3/uL (ref 3.4–10.8)

## 2023-06-20 LAB — LIPID PANEL
Chol/HDL Ratio: 4.9 ratio (ref 0.0–5.0)
Cholesterol, Total: 204 mg/dL — ABNORMAL HIGH (ref 100–199)
HDL: 42 mg/dL (ref >39)
LDL Chol Calc (NIH): 135 mg/dL — ABNORMAL HIGH (ref 0–99)
Triglycerides: 151 mg/dL — ABNORMAL HIGH (ref 0–149)
VLDL Cholesterol Cal: 27 mg/dL (ref 5–40)

## 2023-06-20 NOTE — Therapy (Signed)
 OUTPATIENT PHYSICAL THERAPY LOWER EXTREMITY TREATMENT    Patient Name: Grant Cummings. MRN: [id number] DOB:[birth year], 78 y.o., male Today's Date: 06/20/2023  END OF SESSION:  PT End of Session - 06/20/23 0817     Visit Number 9    Number of Visits 12    Date for PT Re-Evaluation 06/09/23    PT Start Time 0800    PT Stop Time 0849    PT Time Calculation (min) 49 min               Past Medical History:  Diagnosis Date   Anemia    Arthritis    Asbestos exposure    Cataract    GERD (gastroesophageal reflux disease)    Hyperlipidemia    Hypertension    Liver cancer (HCC)    cholangeocarcinoma   PE (pulmonary thromboembolism) (HCC)    Saddle pulmonary embolus (HCC)    Past Surgical History:  Procedure Laterality Date   APPENDECTOMY     CARPAL TUNNEL WITH CUBITAL TUNNEL Right    CHOLECYSTECTOMY     EYE SURGERY Left 2017   lens implant to correct stigmatism / cataract   KNEE SURGERY     left   MELANOMA EXCISION Left 01/17/2020   partial liver removal     x 3   RADIOFREQUENCY ABLATION LIVER TUMOR     with biopsy  - NEG   TOTAL KNEE ARTHROPLASTY Left 07/16/2021   Procedure: TOTAL KNEE ARTHROPLASTY;  Surgeon: Liliane Rei, MD;  Location: [facility];  Service: Orthopedics;  Laterality: Left;   Patient Active Problem List   Diagnosis Date Noted   OA (osteoarthritis) of knee 07/16/2021   Osteoarthritis of left knee 07/16/2021   Personal history of PE (pulmonary embolism) 08/30/2017   Blood type A- 05/08/2015   Hernia of abdominal wall 05/08/2015   Edema 11/22/2014   BPH (benign prostatic hyperplasia) 03/22/2013   Vitamin D  deficiency 03/22/2013   Hearing deficit 03/22/2013   Liver cancer (HCC)    Hyperlipidemia    Hypertension     REFERRING PROVIDER: Liliane Rei MD  REFERRING DIAG: H/o of left total knee replacement  THERAPY DIAG:  Chronic pain of left knee  Stiffness of left knee, not elsewhere classified  Rationale for Evaluation and Treatment:  Rehabilitation  ONSET DATE: Ongoing.    SUBJECTIVE:   SUBJECTIVE STATEMENT:   Patient reports 2/10 left knee pain today. Doing good today. Did okay after last Rx  PERTINENT HISTORY: Left TKA.  Prior left quad injury. PAIN:  Are you having pain? Yes: NPRS scale: 2-3/10  Pain location: left knee  PRECAUTIONS: Other: No ultrasound over left knee.    RED FLAGS: None   WEIGHT BEARING RESTRICTIONS: No  FALLS:  Has patient fallen in last 6 months? Yes. Number of falls Related to haying (800# round bale).    LIVING ENVIRONMENT: Lives with: lives with their spouse Lives in: House/apartment Stairs: Ramp and stairs with railing.   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    PLOF: Independent  PATIENT GOALS: To be able to walk more for enjoyment and quality of life.      OBJECTIVE:  Note: Objective measures were completed at Evaluation unless otherwise noted.  PATIENT SURVEYS:  LEFS 23/80.  EDEMA:  No edema noted.  PALPATION: C/o palpable pain over left knee incision site and over left quadriceps to approximately the mid-portion of the musculature.    LOWER  EXTREMITY ROM:  The patient's left knee extension is -15 degrees (he states it has been like this for years).  His flexion is 110 degrees.    LOWER EXTREMITY MMT:  Normal left knee and hip strength.  GAIT: Mild gait antalgia noted with left knee held in flexion.                                                                                                                                 TREATMENT DATE:                                     06/20/23                                                            EXERCISE LOG   LT knee  Exercise Repetitions and Resistance Comments  Nustep  L4 x 15 mins   Recumbent Bike    Cybex knee extension 20# x 4 minutes   Cybex knee flexion  40# x 4 minutes   Cybex Leg Press    Rocker board  6 minutes    Step up    Leading with LLE   Side-stepping    Tandem Gait    Sit to stand   Without UE support  SLS X 5 Max 7 secs  steps 4 steps up/down x 2 reciprocal pattern   Step side   6 in 3x10   focus on quad control   Split squat on 14 in box 3x10   focus on quad control`    Blank cell = exercise not performed today                                    05/23/23 EXERCISE LOG  Exercise Repetitions and Resistance Comments  Nustep  L4-5 x 15 minutes    Step up  6" step x 3 minutes   Eccentric step down  6" step x 2 minutes  For LLE only   LAQ  4# x 3.5 minutes w/ 5 second hold   Rocker board  5 minutes     Blank cell = exercise not performed today              05/19/23   EXERCISE LOG  Exercise Repetitions and Resistance Comments  Nu step  15 minutes level 4   Arm support level 6, chair at 10   Forward Step Ups  8" box 3 sets of 12 reps   TKE Standing  3 sets 1 minute    Heel Raises  3 sets of 12 reps  LAQ  2# 1 set of 12; 3# 2 set of 12  (Both sides)            Blank cell = exercise not performed today   Manual Therapy   Soft Tissue Mobilization: left knee, patellar tendon and quadriceps muscle in order to decrease pain and stiffness. Patient reported improved mobility, decreased stiffness and no adverse effects upon completion of this element of treatment session.     05/12/23:   HMP and IFC at 80-150 Hz on 40% scan x 20 minutes to affected left quadriceps.   Normal modality response following removal of modality.  PATIENT EDUCATION:  Education details: healing and HEP  Person educated: patient  Education method: verbal Education comprehension: patient reported understanding   HOME EXERCISE PROGRAM: [email or web address]  Access Code: [email or web address]  ASSESSMENT:  CLINICAL IMPRESSION:  Pt arrived  today doing fairly well with LT knee. He was able to continue with  therex for ROM progression as well as strengthening. LTG # 2 and 3 MET today Others on going due to pain greater  than 3 /10.     OBJECTIVE IMPAIRMENTS: decreased activity tolerance, decreased ROM, increased muscle spasms, and pain.   ACTIVITY LIMITATIONS: carrying, lifting, and stairs  PARTICIPATION LIMITATIONS: cleaning and yard work  PERSONAL FACTORS: Time since onset of injury/illness/exacerbation and 1 comorbidity: prior left quad injury are also affecting patient's functional outcome.   REHAB POTENTIAL: Good  CLINICAL DECISION MAKING: Stable/uncomplicated  EVALUATION COMPLEXITY: Low   GOALS:  LONG TERM GOALS: Target date: 06/09/23  Ind with a HEP.  Goal status: IN PROGRESS  2.  Go from sitting to standing and walk with left knee pain not > 3/10.  Goal status: MET  3.  Ascend and descend stairs with left knee pain not > 3/10.    Goal status: MET  4.  Walk a community distance with pain not > 3/10.  Goal status: IN PROGRESS   PLAN:  PT FREQUENCY: 2-3 times a week.  PT DURATION: 4 weeks  PLANNED INTERVENTIONS: 97110-Therapeutic exercises, 97530- Therapeutic activity, [id number]- Neuromuscular re-education, 97535- Self Care, 54098- Manual therapy, G0283- Electrical stimulation (unattended), 97016- Vasopneumatic device, Stair training, Cryotherapy, and Moist heat  PLAN FOR NEXT SESSION: Nustep, progress hip, knee and lower leg muscle strengthening exercises, continue functional mobility exercises, introduce balance exercises. Soft tissue work to left quadriceps and patellar tendon. Modalities as needed   Kewanna Kasprzak,CHRIS, PTA 06/20/2023, 8:54 AM

## 2023-06-23 ENCOUNTER — Encounter: Payer: Self-pay | Admitting: Family Medicine

## 2023-06-23 ENCOUNTER — Ambulatory Visit

## 2023-06-23 DIAGNOSIS — M25562 Pain in left knee: Secondary | ICD-10-CM | POA: Diagnosis not present

## 2023-06-23 DIAGNOSIS — M25662 Stiffness of left knee, not elsewhere classified: Secondary | ICD-10-CM

## 2023-06-23 DIAGNOSIS — G8929 Other chronic pain: Secondary | ICD-10-CM

## 2023-06-23 NOTE — Addendum Note (Signed)
 Addended by: Vidit Boissonneault, [place] W on: 06/23/2023 12:26 PM   Modules accepted: Orders

## 2023-06-23 NOTE — Therapy (Addendum)
 OUTPATIENT PHYSICAL THERAPY LOWER EXTREMITY TREATMENT    Patient Name: Grant Cummings. MRN: [id number] DOB:October 20, 1945, 78 y.o., male Today's Date: 06/23/2023  END OF SESSION:  PT End of Session - 06/23/23 0935     Visit Number 10    Number of Visits 12    Date for PT Re-Evaluation 06/09/23    PT Start Time 0934    PT Stop Time 1016    PT Time Calculation (min) 42 min               Past Medical History:  Diagnosis Date   Anemia    Arthritis    Asbestos exposure    Cataract    GERD (gastroesophageal reflux disease)    Hyperlipidemia    Hypertension    Liver cancer (HCC)    cholangeocarcinoma   PE (pulmonary thromboembolism) (HCC)    Saddle pulmonary embolus (HCC)    Past Surgical History:  Procedure Laterality Date   APPENDECTOMY     CARPAL TUNNEL WITH CUBITAL TUNNEL Right    CHOLECYSTECTOMY     EYE SURGERY Left 2017   lens implant to correct stigmatism / cataract   KNEE SURGERY     left   MELANOMA EXCISION Left 01/17/2020   partial liver removal     x 3   RADIOFREQUENCY ABLATION LIVER TUMOR     with biopsy  - NEG   TOTAL KNEE ARTHROPLASTY Left 07/16/2021   Procedure: TOTAL KNEE ARTHROPLASTY;  Surgeon: Melodi Lerner, MD;  Location: [facility];  Service: Orthopedics;  Laterality: Left;   Patient Active Problem List   Diagnosis Date Noted   OA (osteoarthritis) of knee 07/16/2021   Osteoarthritis of left knee 07/16/2021   Personal history of PE (pulmonary embolism) 08/30/2017   Blood type A- 05/08/2015   Hernia of abdominal wall 05/08/2015   Edema 11/22/2014   BPH (benign prostatic hyperplasia) 03/22/2013   Vitamin D  deficiency 03/22/2013   Hearing deficit 03/22/2013   Liver cancer (HCC)    Hyperlipidemia    Hypertension     REFERRING PROVIDER: Lerner Melodi MD  REFERRING DIAG: H/o of left total knee replacement  THERAPY DIAG:  Chronic pain of left knee  Stiffness of left knee, not elsewhere classified  Rationale for Evaluation and Treatment:  Rehabilitation  ONSET DATE: Ongoing.    SUBJECTIVE:   SUBJECTIVE STATEMENT:   Patient denies any pain today.   PERTINENT HISTORY: Left TKA.  Prior left quad injury. PAIN:  Are you having pain? No  PRECAUTIONS: Other: No ultrasound over left knee.    RED FLAGS: None   WEIGHT BEARING RESTRICTIONS: No  FALLS:  Has patient fallen in last 6 months? Yes. Number of falls Related to haying (800# round bale).    LIVING ENVIRONMENT: Lives with: lives with their spouse Lives in: House/apartment Stairs: Ramp and stairs with railing.   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    PLOF: Independent  PATIENT GOALS: To be able to walk more for enjoyment and quality of life.      OBJECTIVE:  Note: Objective measures were completed at Evaluation unless otherwise noted.  PATIENT SURVEYS:  LEFS 23/80.  EDEMA:  No edema noted.  PALPATION: C/o palpable pain over left knee incision site and over left quadriceps to approximately the mid-portion of the musculature.    LOWER EXTREMITY ROM:  The patient's left knee extension is -15 degrees (he states it has been like  this for years).  His flexion is 110 degrees.    LOWER EXTREMITY MMT:  Normal left knee and hip strength.  GAIT: Mild gait antalgia noted with left knee held in flexion.                                                                                                                                 TREATMENT DATE:                                    06/23/23                                 EXERCISE LOG   LT knee  Exercise Repetitions and Resistance Comments  Nustep  Lvl 4 x 10 mins   Recumbent Bike Lvl 3 x 10 mins   Cybex knee extension 20# x 4.5 minutes   Cybex knee flexion  40# x 4.5 minutes   Cybex Leg Press    Rocker board  6 minutes    Lunges 14 box x 5 mins Leading with LLE   Side-stepping    Tandem Gait    Sit to stand   Without UE support   SLS  Max 7 secs  steps    Step side   6 in 3x10   focus on quad control   Split squat on 14 in box     Blank cell = exercise not performed today                                    05/23/23 EXERCISE LOG  Exercise Repetitions and Resistance Comments  Nustep  L4-5 x 15 minutes    Step up  6 step x 3 minutes   Eccentric step down  6 step x 2 minutes  For LLE only   LAQ  4# x 3.5 minutes w/ 5 second hold   Rocker board  5 minutes     Blank cell = exercise not performed today              05/19/23   EXERCISE LOG  Exercise Repetitions and Resistance Comments  Nu step  15 minutes level 4   Arm support level 6, chair at 10   Forward Step Ups  8 box 3 sets of 12 reps   TKE Standing  3 sets 1 minute    Heel Raises  3 sets of 12 reps    LAQ  2# 1 set of 12; 3# 2 set of 12  (Both sides)            Blank cell = exercise not performed today   Manual Therapy   Soft Tissue Mobilization: left knee, patellar tendon and quadriceps  muscle in order to decrease pain and stiffness. Patient reported improved mobility, decreased stiffness and no adverse effects upon completion of this element of treatment session.     05/12/23:   HMP and IFC at 80-150 Hz on 40% scan x 20 minutes to affected left quadriceps.   Normal modality response following removal of modality.  PATIENT EDUCATION:  Education details: healing and HEP  Person educated: patient  Education method: verbal Education comprehension: patient reported understanding   HOME EXERCISE PROGRAM: [email or web address]  Access Code: [email or web address]  ASSESSMENT:  CLINICAL IMPRESSION:  Pt arrives for today's treatment session denying any pain.  Pt able to tolerate increased time with all cybex exercises.  Pt has met all of the goals set forth for him by physical therapy at this time. Pt would like to go on hold and plans to contact the facility with his plans in a few weeks.  Pt denied any pain at completion of today's treatment  session.  OBJECTIVE IMPAIRMENTS: decreased activity tolerance, decreased ROM, increased muscle spasms, and pain.   ACTIVITY LIMITATIONS: carrying, lifting, and stairs  PARTICIPATION LIMITATIONS: cleaning and yard work  PERSONAL FACTORS: Time since onset of injury/illness/exacerbation and 1 comorbidity: prior left quad injury are also affecting patient's functional outcome.   REHAB POTENTIAL: Good  CLINICAL DECISION MAKING: Stable/uncomplicated  EVALUATION COMPLEXITY: Low   GOALS:  LONG TERM GOALS: Target date: 06/09/23  Ind with a HEP.  Goal status: PARTIALLY MET  2.  Go from sitting to standing and walk with left knee pain not > 3/10.  Goal status: MET  3.  Ascend and descend stairs with left knee pain not > 3/10.    Goal status: MET  4.  Walk a community distance with pain not > 3/10.  Goal status: MET   PLAN:  PT FREQUENCY: 2-3 times a week.  PT DURATION: 4 weeks  PLANNED INTERVENTIONS: 97110-Therapeutic exercises, 97530- Therapeutic activity, [id number]- Neuromuscular re-education, 97535- Self Care, 02859- Manual therapy, G0283- Electrical stimulation (unattended), 97016- Vasopneumatic device, Stair training, Cryotherapy, and Moist heat  PLAN FOR NEXT SESSION: Nustep, progress hip, knee and lower leg muscle strengthening exercises, continue functional mobility exercises, introduce balance exercises. Soft tissue work to left quadriceps and patellar tendon. Modalities as needed   Delon DELENA Gosling, PTA 06/23/2023, 10:25 AM   PHYSICAL THERAPY DISCHARGE SUMMARY  Visits from Start of Care: 10.  Current functional level related to goals / functional outcomes: See above.   Remaining deficits: See goal section. Education / Equipment: HEP.   Patient agrees to discharge. Patient goals were not met. Patient is being discharged due to being pleased with the current functional level.    Chad Applegate MPT

## 2023-06-24 ENCOUNTER — Ambulatory Visit: Payer: Self-pay

## 2023-06-24 ENCOUNTER — Other Ambulatory Visit: Payer: Self-pay

## 2023-06-24 DIAGNOSIS — E782 Mixed hyperlipidemia: Secondary | ICD-10-CM

## 2023-06-24 MED ORDER — EZETIMIBE 10 MG PO TABS: 10.0000 mg | ORAL_TABLET | Freq: Every day | ORAL | 3 refills | Status: AC

## 2023-06-26 ENCOUNTER — Encounter

## 2023-07-04 DIAGNOSIS — D3131 Benign neoplasm of right choroid: Secondary | ICD-10-CM | POA: Diagnosis not present

## 2023-07-08 ENCOUNTER — Encounter: Payer: Self-pay | Admitting: Family Medicine

## 2023-07-08 DIAGNOSIS — C44519 Basal cell carcinoma of skin of other part of trunk: Secondary | ICD-10-CM | POA: Diagnosis not present

## 2023-08-24 DIAGNOSIS — R0602 Shortness of breath: Secondary | ICD-10-CM | POA: Insufficient documentation

## 2023-08-24 NOTE — Progress Notes (Unsigned)
 Cardiology Office Note   Date:  08/27/2023   ID:  Grant Worley., DOB 31-Jan-1946, MRN [id number]  PCP:  Dettinger, Fonda LABOR, MD  Cardiologist:   Lynwood Schilling, MD Referring:  Dettinger, Fonda LABOR, MD  Chief Complaint  Patient presents with   Shortness of Breath      History of Present Illness: Grant Slager. is a 78 y.o. male who presents for evaluation of SOB.  He has a history of asbestosis.  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  He did have an echo in 2019 without evidence of valvular abnormalities.  He had normal LV function.  There did not appear to be RV dysfunction or pressure overload.    He has been getting slightly progressively more short of breath.  His wife seems to notice this more than him.  He will get short of breath walking up an incline.  He can climb the 18 stairs from the basement and make it up to the top and keep going but his wife says he is winded.  He is not describing any chest pressure, neck or arm discomfort.  He does not describe any shortness of breath at rest and is not describing PND or orthopnea.  He does not notice any palpitations, presyncope or syncope.  He has some chronic mild lower extremity swelling and some chronic venous stasis changes on the right.  Of note he does get [facility] at [facility] and I noticed that he has had some coronary calcium .  He has not ever had any stress testing.  He does have some dyslipidemia.  Blood pressures are controlled.   Past Medical History:  Diagnosis Date   Anemia    Arthritis    Asbestos exposure    Cataract    GERD (gastroesophageal reflux disease)    Hyperlipidemia    Hypertension    Liver cancer (HCC)    cholangeocarcinoma   Saddle pulmonary embolus (HCC)     Past Surgical History:  Procedure Laterality Date   APPENDECTOMY     CARPAL TUNNEL WITH CUBITAL TUNNEL Right    CHOLECYSTECTOMY     EYE SURGERY  Left 2017   lens implant to correct stigmatism / cataract   KNEE SURGERY     left   MELANOMA EXCISION Left 01/17/2020   partial liver removal     x 3   RADIOFREQUENCY ABLATION LIVER TUMOR     with biopsy  - NEG   TOTAL KNEE ARTHROPLASTY Left 07/16/2021   Procedure: TOTAL KNEE ARTHROPLASTY;  Surgeon: Melodi Lerner, MD;  Location: [facility];  Service: Orthopedics;  Laterality: Left;     Current Outpatient Medications  Medication Sig Dispense Refill   acetaminophen  (TYLENOL ) 500 MG tablet Take 500-1,000 mg by mouth every 6 (six) hours as needed (for knee pain.).     amLODipine  (NORVASC ) 10 MG tablet Take 1 tablet (10 mg total) by mouth daily. 90 tablet 3   apixaban  (ELIQUIS ) 5 MG TABS tablet Take 1 tablet (5 mg total) by mouth 2 (two) times daily. 180 tablet 3   carvedilol  (COREG ) 12.5 MG tablet Take 1 tablet (12.5 mg total) by mouth 2 (two) times daily with a meal. 180 tablet 3   cholecalciferol (VITAMIN D ) 1000 UNITS tablet Take 1,000 Units by mouth daily.     ezetimibe  (ZETIA ) 10 MG tablet Take 1 tablet (10 mg total) by mouth daily.  Take at night 90 tablet 3   fluticasone  (FLONASE ) 50 MCG/ACT nasal spray Place 1-2 sprays into both nostrils daily as needed for allergies or rhinitis. 15.8 mL 2   hydrochlorothiazide  (HYDRODIURIL ) 25 MG tablet Take 1 tablet (25 mg total) by mouth daily. 90 tablet 3   Multiple Vitamin (MULTIVITAMIN WITH MINERALS) TABS tablet Take 1 tablet by mouth every evening.     naphazoline-pheniramine (ALLERGY EYE) 0.025-0.3 % ophthalmic solution 1-2 drops 4 (four) times daily as needed for eye irritation or allergies.     pantoprazole  (PROTONIX ) 40 MG tablet Take 1 tablet (40 mg total) by mouth daily. 90 tablet 3   potassium chloride  (KLOR-CON  M) 10 MEQ tablet Take 1 tablet (10 mEq total) by mouth daily. 90 tablet 3   tamsulosin  (FLOMAX ) 0.4 MG CAPS capsule Take 1 capsule (0.4 mg total) by mouth daily. 90 capsule 3   No current facility-administered medications for this  visit.    Allergies:   Nsaids    Social History:  The patient  reports that he has never smoked. He has been exposed to tobacco smoke. He has never used smokeless tobacco. He reports that he does not drink alcohol and does not use drugs.   Family History:  The patient's family history includes Alzheimer's disease in his father and mother; Breast cancer in his mother; Cancer in his father and sister; Cancer (age of onset: 63) in his mother; Clotting disorder in his paternal grandfather; GI problems in his sister; Heart disease in his father; Hypertension in his father and sister; Melanoma (age of onset: 59) in his daughter.    ROS:  Please see the history of present illness.   Otherwise, review of systems are positive for none.   All other systems are reviewed and negative.    PHYSICAL EXAM: VS:  BP 130/72   Pulse 67   Ht 6' 1 (1.854 m)   Wt 232 lb (105.2 kg)   BMI 30.61 kg/m  , BMI Body mass index is 30.61 kg/m. GENERAL:  Well appearing HEENT:  Pupils equal round and reactive, fundi not visualized, oral mucosa unremarkable NECK:  No jugular venous distention, waveform within normal limits, carotid upstroke brisk and symmetric, no bruits, no thyromegaly LYMPHATICS:  No cervical, inguinal adenopathy LUNGS:  Clear to auscultation bilaterally BACK:  No CVA tenderness CHEST:  Unremarkable HEART:  PMI not displaced or sustained,S1 and S2 within normal limits, no S3, no S4, no clicks, no rubs, no murmurs ABD:  Flat, positive bowel sounds normal in frequency in pitch, no bruits, no rebound, no guarding, no midline pulsatile mass, no hepatomegaly, no splenomegaly EXT:  2 plus pulses throughout, mild right greater than left leg edema, no cyanosis no clubbing SKIN:  No rashes no nodules NEURO:  Cranial nerves II through XII grossly intact, motor grossly intact throughout PSYCH:  Cognitively intact, oriented to person place and time    EKG:  EKG Interpretation Date/Time:  Wednesday August 27 2023 15:13:38 EDT Ventricular Rate:  67 PR Interval:  220 QRS Duration:  100 QT Interval:  404 QTC Calculation: 426 R Axis:   34  Text Interpretation: Sinus rhythm with 1st degree A-V block When compared with ECG of 30-Aug-2017 13:05, No significant change since last tracing Confirmed by Lavona Agent ([id number]) on 08/27/2023 3:41:05 PM     Recent Labs: 06/19/2023: ALT 30; BUN 17; Creatinine, Ser 1.23; Hemoglobin 13.6; Platelets 202; Potassium 4.2; Sodium 140    Lipid Panel    Component Value  Date/Time   CHOL 204 (H) 06/19/2023 1125   TRIG 151 (H) 06/19/2023 1125   TRIG 88 05/30/2016 0945   HDL 42 06/19/2023 1125   HDL 43 05/30/2016 0945   CHOLHDL 4.9 06/19/2023 1125   LDLCALC 135 (H) 06/19/2023 1125   LDLCALC 63 09/20/2013 0939      Wt Readings from Last 3 Encounters:  08/27/23 232 lb (105.2 kg)  06/19/23 233 lb (105.7 kg)  02/26/23 232 lb (105.2 kg)      Other studies Reviewed: Additional studies/ records that were reviewed today include: [facility] records,, previous echocardiogram and hospitalization for PE. Review of the above records demonstrates:  Please see elsewhere in the note.     ASSESSMENT AND PLAN:  SOB: He has had some asbestosis.  He is also had some weight gain.  This may be the etiology of his shortness of breath but I will check a BNP and an echocardiogram.  If this is unremarkable I would suggest 6 pounds of weight loss over the next few months and continued activity to see if his breathing gets better.  If it gets worse rather than better I would consider ischemia workup.  PE: He is on lifelong anticoagulation.  I will check the echo as above.  Dyslipidemia his triglycerides were elevated.  LDL is 135.  I have suggested to him a diet and if he can get his LDL down to less than 100 I might suggest a statin.  He is currently on Zetia .   Current medicines are reviewed at length with the patient today.  The patient does not have concerns regarding  medicines.  The following changes have been made:  no change  Labs/ tests ordered today include:   Orders Placed This Encounter  Procedures   B Nat Peptide   EKG 12-Lead   ECHOCARDIOGRAM COMPLETE     Disposition:   FU with with me  as needed.     Signed, Lynwood Schilling, MD  08/27/2023 4:19 PM    [facility]

## 2023-08-27 ENCOUNTER — Encounter: Payer: Self-pay | Admitting: Cardiology

## 2023-08-27 ENCOUNTER — Other Ambulatory Visit

## 2023-08-27 ENCOUNTER — Ambulatory Visit: Admitting: Cardiology

## 2023-08-27 VITALS — BP 130/72 | HR 67 | Ht 73.0 in | Wt 232.0 lb

## 2023-08-27 DIAGNOSIS — R0602 Shortness of breath: Secondary | ICD-10-CM

## 2023-08-27 NOTE — Patient Instructions (Signed)
 Medication Instructions:   Your physician recommends that you continue on your current medications as directed. Please refer to the Current Medication list given to you today.  Labwork: BNP  Testing/Procedures: Your physician has requested that you have an echocardiogram. Echocardiography is a painless test that uses sound waves to create images of your heart. It provides your doctor with information about the size and shape of your heart and how well your heart's chambers and valves are working. This procedure takes approximately one hour. There are no restrictions for this procedure. Please do NOT wear cologne, perfume, aftershave, or lotions (deodorant is allowed). Please arrive 15 minutes prior to your appointment time.  Please note: We ask at that you not bring children with you during ultrasound (echo/ vascular) testing. Due to room size and safety concerns, children are not allowed in the ultrasound rooms during exams. Our front office staff cannot provide observation of children in our lobby area while testing is being conducted. An adult accompanying a patient to their appointment will only be allowed in the ultrasound room at the discretion of the ultrasound technician under special circumstances. We apologize for any inconvenience.   Follow-Up: As needed with Dr.Hochrein  Any Other Special Instructions Will Be Listed Below (If Applicable).  If you need a refill on your cardiac medications before your next appointment, please call your pharmacy.

## 2023-08-29 ENCOUNTER — Ambulatory Visit: Payer: Self-pay | Admitting: Cardiology

## 2023-08-29 LAB — BRAIN NATRIURETIC PEPTIDE: BNP: 43.6 pg/mL (ref 0.0–100.0)

## 2023-09-22 ENCOUNTER — Ambulatory Visit: Attending: Cardiology

## 2023-09-22 DIAGNOSIS — R0602 Shortness of breath: Secondary | ICD-10-CM

## 2023-09-23 ENCOUNTER — Other Ambulatory Visit

## 2023-09-23 LAB — ECHOCARDIOGRAM COMPLETE
AR max vel: 2.19 cm2
AV Area VTI: 2.33 cm2
AV Area mean vel: 2.21 cm2
AV Mean grad: 5 mmHg
AV Peak grad: 9.6 mmHg
Ao pk vel: 1.55 m/s
Area-P 1/2: 3.06 cm2
Calc EF: 69.5 %
MV VTI: 3.26 cm2
S' Lateral: 2.4 cm
Single Plane A2C EF: 52.6 %
Single Plane A4C EF: 79 %

## 2023-10-08 ENCOUNTER — Emergency Department (HOSPITAL_COMMUNITY)

## 2023-10-08 ENCOUNTER — Ambulatory Visit: Payer: Self-pay

## 2023-10-08 ENCOUNTER — Encounter (HOSPITAL_COMMUNITY): Payer: Self-pay

## 2023-10-08 ENCOUNTER — Emergency Department (HOSPITAL_COMMUNITY)
Admission: EM | Admit: 2023-10-08 | Discharge: 2023-10-08 | Disposition: A | Discharge status: Home / Self Care | Attending: Emergency Medicine | Admitting: Emergency Medicine

## 2023-10-08 ENCOUNTER — Other Ambulatory Visit: Payer: Self-pay

## 2023-10-08 DIAGNOSIS — M25562 Pain in left knee: Secondary | ICD-10-CM | POA: Insufficient documentation

## 2023-10-08 DIAGNOSIS — M79641 Pain in right hand: Secondary | ICD-10-CM | POA: Diagnosis not present

## 2023-10-08 DIAGNOSIS — K7460 Unspecified cirrhosis of liver: Secondary | ICD-10-CM | POA: Diagnosis not present

## 2023-10-08 DIAGNOSIS — R519 Headache, unspecified: Secondary | ICD-10-CM | POA: Insufficient documentation

## 2023-10-08 DIAGNOSIS — M546 Pain in thoracic spine: Secondary | ICD-10-CM | POA: Diagnosis not present

## 2023-10-08 DIAGNOSIS — M542 Cervicalgia: Secondary | ICD-10-CM | POA: Diagnosis not present

## 2023-10-08 DIAGNOSIS — S80219A Abrasion, unspecified knee, initial encounter: Secondary | ICD-10-CM

## 2023-10-08 DIAGNOSIS — I2510 Atherosclerotic heart disease of native coronary artery without angina pectoris: Secondary | ICD-10-CM | POA: Insufficient documentation

## 2023-10-08 DIAGNOSIS — Z7901 Long term (current) use of anticoagulants: Secondary | ICD-10-CM | POA: Diagnosis not present

## 2023-10-08 DIAGNOSIS — M19041 Primary osteoarthritis, right hand: Secondary | ICD-10-CM | POA: Diagnosis not present

## 2023-10-08 DIAGNOSIS — R079 Chest pain, unspecified: Secondary | ICD-10-CM | POA: Diagnosis not present

## 2023-10-08 DIAGNOSIS — S0990XA Unspecified injury of head, initial encounter: Secondary | ICD-10-CM | POA: Diagnosis not present

## 2023-10-08 DIAGNOSIS — M4312 Spondylolisthesis, cervical region: Secondary | ICD-10-CM | POA: Diagnosis not present

## 2023-10-08 DIAGNOSIS — M40204 Unspecified kyphosis, thoracic region: Secondary | ICD-10-CM | POA: Diagnosis not present

## 2023-10-08 DIAGNOSIS — W01198A Fall on same level from slipping, tripping and stumbling with subsequent striking against other object, initial encounter: Secondary | ICD-10-CM | POA: Insufficient documentation

## 2023-10-08 DIAGNOSIS — M47814 Spondylosis without myelopathy or radiculopathy, thoracic region: Secondary | ICD-10-CM | POA: Diagnosis not present

## 2023-10-08 DIAGNOSIS — M47812 Spondylosis without myelopathy or radiculopathy, cervical region: Secondary | ICD-10-CM | POA: Diagnosis not present

## 2023-10-08 DIAGNOSIS — S80212A Abrasion, left knee, initial encounter: Secondary | ICD-10-CM | POA: Diagnosis not present

## 2023-10-08 DIAGNOSIS — I700 Atherosclerosis of aorta: Secondary | ICD-10-CM | POA: Diagnosis not present

## 2023-10-08 DIAGNOSIS — S12501A Unspecified nondisplaced fracture of sixth cervical vertebra, initial encounter for closed fracture: Secondary | ICD-10-CM | POA: Diagnosis not present

## 2023-10-08 DIAGNOSIS — S0083XA Contusion of other part of head, initial encounter: Secondary | ICD-10-CM

## 2023-10-08 DIAGNOSIS — M25561 Pain in right knee: Secondary | ICD-10-CM | POA: Diagnosis not present

## 2023-10-08 DIAGNOSIS — M1711 Unilateral primary osteoarthritis, right knee: Secondary | ICD-10-CM | POA: Diagnosis not present

## 2023-10-08 DIAGNOSIS — I10 Essential (primary) hypertension: Secondary | ICD-10-CM | POA: Diagnosis not present

## 2023-10-08 DIAGNOSIS — R270 Ataxia, unspecified: Secondary | ICD-10-CM | POA: Diagnosis not present

## 2023-10-08 DIAGNOSIS — S2020XA Contusion of thorax, unspecified, initial encounter: Secondary | ICD-10-CM | POA: Diagnosis not present

## 2023-10-08 DIAGNOSIS — Z96652 Presence of left artificial knee joint: Secondary | ICD-10-CM | POA: Diagnosis not present

## 2023-10-08 DIAGNOSIS — S20229A Contusion of unspecified back wall of thorax, initial encounter: Secondary | ICD-10-CM

## 2023-10-08 DIAGNOSIS — S80211A Abrasion, right knee, initial encounter: Secondary | ICD-10-CM | POA: Diagnosis not present

## 2023-10-08 LAB — BASIC METABOLIC PANEL WITH GFR
Anion gap: 11 (ref 5–15)
BUN: 20 mg/dL (ref 8–23)
CO2: 24 mmol/L (ref 22–32)
Calcium: 9.1 mg/dL (ref 8.9–10.3)
Chloride: 102 mmol/L (ref 98–111)
Creatinine, Ser: 1 mg/dL (ref 0.61–1.24)
GFR, Estimated: >60 mL/min (ref >60)
Glucose, Bld: 172 mg/dL — ABNORMAL HIGH (ref 70–99)
Potassium: 3.7 mmol/L (ref 3.5–5.1)
Sodium: 137 mmol/L (ref 135–145)

## 2023-10-08 LAB — CBC WITH DIFFERENTIAL/PLATELET
Abs Immature Granulocytes: 0.02 K/uL (ref 0.00–0.07)
Basophils Absolute: 0 K/uL (ref 0.0–0.1)
Basophils Relative: 0 %
Eosinophils Absolute: 0.1 K/uL (ref 0.0–0.5)
Eosinophils Relative: 1 %
HCT: 41.3 % (ref 39.0–52.0)
Hemoglobin: 13.6 g/dL (ref 13.0–17.0)
Immature Granulocytes: 0 %
Lymphocytes Relative: 9 %
Lymphs Abs: 0.6 K/uL — ABNORMAL LOW (ref 0.7–4.0)
MCH: 30.5 pg (ref 26.0–34.0)
MCHC: 32.9 g/dL (ref 30.0–36.0)
MCV: 92.6 fL (ref 80.0–100.0)
Monocytes Absolute: 0.4 K/uL (ref 0.1–1.0)
Monocytes Relative: 7 %
Neutro Abs: 5.3 K/uL (ref 1.7–7.7)
Neutrophils Relative %: 83 %
Platelets: 160 K/uL (ref 150–400)
RBC: 4.46 MIL/uL (ref 4.22–5.81)
RDW: 13.5 % (ref 11.5–15.5)
WBC: 6.4 K/uL (ref 4.0–10.5)
nRBC: 0 % (ref 0.0–0.2)

## 2023-10-08 LAB — BRAIN NATRIURETIC PEPTIDE: B Natriuretic Peptide: 61 pg/mL (ref 0.0–100.0)

## 2023-10-08 NOTE — Discharge Instructions (Signed)
 Please follow-up closely with your primary care doctor on an outpatient basis.  Return to emergency department immediately for any new or worsening symptoms.

## 2023-10-08 NOTE — Telephone Encounter (Signed)
 FYI Only or Action Required?: FYI only for provider.  Patient was last seen in primary care on 06/19/2023 by Dettinger, Fonda LABOR, MD.  Called Nurse Triage reporting Fall.  Symptoms began today.  Interventions attempted: Ice/heat application.  Symptoms are: unchanged.  Triage Disposition: Go to ED Now (or PCP Triage)  Patient/caregiver understands and will follow disposition?: Yes     Copied from CRM [id number]. Topic: Clinical - Red Word Triage >> Oct 08, 2023  9:48 AM Ivette P wrote: Red Word that prompted transfer to Nurse Triage: fall, hit his head, does not bleeding.    Pt on blood thinners, wanting advice Reason for Disposition  Taking Coumadin (warfarin) or other strong blood thinner, or known bleeding disorder (e.g., thrombocytopenia)  Answer Assessment - Initial Assessment Questions 1. MECHANISM: How did the injury happen? For falls, ask: What height did you fall from? and What surface did you fall against?      Tripped over farm equipment and hit his forehead 2. ONSET: When did the injury happen? (e.g., minutes, hours ago)      About 10 minutes ago  3. NEUROLOGIC SYMPTOMS: Was there any loss of consciousness? Are there any other neurological symptoms?      no 4. MENTAL STATUS: Does the person know who they are, who you are, and where they are?      AAO  5. LOCATION: What part of the head was hit?      Forehead but his neck snapped back  6. SCALP APPEARANCE: What does the scalp look like? Is it bleeding now? If Yes, ask: Is it difficult to stop?      Scrap on the fore 7. SIZE: For cuts, bruises, or swelling, ask: How large is it? (e.g., inches or centimeters)      Small scrap 8. PAIN: Is there any pain? If Yes, ask: How bad is it? (Scale 0-10; or none, mild, moderate, severe)     mod 9. TETANUS: For any breaks in the skin, ask: When was your last tetanus booster?     *No Answer* 10. BLOOD THINNERS: Do you take any blood thinners? (e.g.,  aspirin, clopidogrel / Plavix, coumadin, heparin ). Notes: Other strong blood thinners include: Arixtra (fondaparinux), Eliquis  (apixaban ), Pradaxa (dabigatran), and Xarelto (rivaroxaban).       eliquis  11. OTHER SYMPTOMS: Do you have any other symptoms? (e.g., neck pain, vomiting)       Neck pain  Protocols used: Head Injury-A-AH

## 2023-10-08 NOTE — Telephone Encounter (Signed)
Patient at ED now  °

## 2023-10-08 NOTE — ED Provider Notes (Signed)
 [place] EMERGENCY DEPARTMENT AT [facility] Provider Note   CSN: [id number] Arrival date & time: 10/08/23  1032     Patient presents with: Grant Cummings. is a 78 y.o. male.   Patient is a 78 year old male who presents to the emergency department with a chief complaint of pain to his head, neck, posterior thoracic region, right hand and bilateral knees.  Patient notes he had a fall this morning over a piece of farm equipment.  He notes that the fall was mechanical in nature.  He denies any active dizziness, lightheadedness and was no associated syncope.  He denies any chest pain or abdominal pain.  He denies any long bone or joint pain.  He denies any numbness, paresthesias or unilateral weakness.   Fall Associated symptoms include headaches.       Prior to Admission medications   Medication Sig Start Date End Date Taking? Authorizing Provider  acetaminophen  (TYLENOL ) 500 MG tablet Take 500-1,000 mg by mouth every 6 (six) hours as needed (for knee pain.).    [provider]  amLODipine  (NORVASC ) 10 MG tablet Take 1 tablet (10 mg total) by mouth daily. 06/19/23   Dettinger, Fonda LABOR, MD  apixaban  (ELIQUIS ) 5 MG TABS tablet Take 1 tablet (5 mg total) by mouth 2 (two) times daily. 06/19/23   Dettinger, Fonda LABOR, MD  carvedilol  (COREG ) 12.5 MG tablet Take 1 tablet (12.5 mg total) by mouth 2 (two) times daily with a meal. 06/19/23   Dettinger, Fonda LABOR, MD  cholecalciferol (VITAMIN D ) 1000 UNITS tablet Take 1,000 Units by mouth daily.    [provider]  ezetimibe  (ZETIA ) 10 MG tablet Take 1 tablet (10 mg total) by mouth daily. Take at night 06/24/23   Dettinger, Fonda LABOR, MD  fluticasone  (FLONASE ) 50 MCG/ACT nasal spray Place 1-2 sprays into both nostrils daily as needed for allergies or rhinitis. 06/19/23   Dettinger, Fonda LABOR, MD  hydrochlorothiazide  (HYDRODIURIL ) 25 MG tablet Take 1 tablet (25 mg total) by mouth daily. 06/19/23   Dettinger, Fonda LABOR, MD   Multiple Vitamin (MULTIVITAMIN WITH MINERALS) TABS tablet Take 1 tablet by mouth every evening.    [provider]  naphazoline-pheniramine (ALLERGY EYE) 0.025-0.3 % ophthalmic solution 1-2 drops 4 (four) times daily as needed for eye irritation or allergies.    [provider]  pantoprazole  (PROTONIX ) 40 MG tablet Take 1 tablet (40 mg total) by mouth daily. 06/19/23   Dettinger, Fonda LABOR, MD  potassium chloride  (KLOR-CON  M) 10 MEQ tablet Take 1 tablet (10 mEq total) by mouth daily. 06/19/23   Dettinger, Fonda LABOR, MD  tamsulosin  (FLOMAX ) 0.4 MG CAPS capsule Take 1 capsule (0.4 mg total) by mouth daily. 12/20/22   Dettinger, Fonda LABOR, MD    Allergies: Nsaids    Review of Systems  Musculoskeletal:        Pain to neck, back, bilateral knees and right hand  Neurological:  Positive for headaches.  All other systems reviewed and are negative.   Updated Vital Signs Pulse 64   Temp 98.2 F (36.8 C) (Oral)   Resp 18   Ht 6' 1 (1.854 m)   Wt 105.2 kg   SpO2 98%   BMI 30.61 kg/m   Physical Exam Vitals and nursing note reviewed.  Constitutional:      General: He is not in acute distress.    Appearance: Normal appearance. He is not ill-appearing.  HENT:     Head: Normocephalic  and atraumatic.     Nose: Nose normal.     Mouth/Throat:     Mouth: Mucous membranes are moist.  Eyes:     Extraocular Movements: Extraocular movements intact.     Conjunctiva/sclera: Conjunctivae normal.     Pupils: Pupils are equal, round, and reactive to light.  Neck:     Comments: Mild midline tenderness, no step-off or deformity, full range of motion Cardiovascular:     Rate and Rhythm: Normal rate and regular rhythm.     Pulses: Normal pulses.     Heart sounds: Normal heart sounds. No murmur heard.    No gallop.  Pulmonary:     Effort: Pulmonary effort is normal. No respiratory distress.     Breath sounds: Normal breath sounds. No stridor. No wheezing, rhonchi or rales.  Chest:      Chest wall: No tenderness.  Abdominal:     General: Abdomen is flat. Bowel sounds are normal. There is no distension.     Palpations: Abdomen is soft.     Tenderness: There is no abdominal tenderness. There is no guarding.  Musculoskeletal:        General: Normal range of motion.     Cervical back: Normal range of motion and neck supple. No rigidity.     Comments: Tender to palpation noted over the right hand, bilateral knees, nontender palpation remainder bilateral upper lower extremities, pelvis stable to AP and lateral compression, full range of motion noted throughout, superficial abrasions over bilateral knees, radial pulses 2+ distally bilobar extremities, DP and PT pulses 2+ distally bilateral lower extremities, sensation intact distally, throughout, mild tenderness palpation over upper thoracic spine, nontender palpation over lumbar spine, no step-off or deformity, no CVA tenderness, tender to palpation noted over bilateral posterior thoracic region  Skin:    General: Skin is warm and dry.  Neurological:     General: No focal deficit present.     Mental Status: He is alert and oriented to person, place, and time. Mental status is at baseline.     Cranial Nerves: No cranial nerve deficit.     Sensory: No sensory deficit.     Motor: No weakness.     Coordination: Coordination normal.     Gait: Gait normal.  Psychiatric:        Mood and Affect: Mood normal.        Behavior: Behavior normal.        Thought Content: Thought content normal.        Judgment: Judgment normal.     (all labs ordered are listed, but only abnormal results are displayed) Labs Reviewed  BRAIN NATRIURETIC PEPTIDE  CBC WITH DIFFERENTIAL/PLATELET    EKG: None  Radiology: DG Knee Complete 4 Views Right Result Date: 10/08/2023 CLINICAL DATA:  Diffuse pain status post fall. EXAM: RIGHT KNEE - COMPLETE 4+ VIEW; LEFT KNEE - COMPLETE 4+ VIEW COMPARISON:  11/22/2014 FINDINGS: LEFT KNEE: Total knee prosthesis is  well seated without periprosthetic fracture or lucency. There is thickening of the patellar tendon. RIGHT KNEE: Moderate degenerative changes of the medial and patellofemoral compartments. Mild degenerative changes of the lateral compartment. No fracture or dislocation. Mild thickening of the patellar tendon. IMPRESSION: 1. No acute fracture or dislocation of the knees. 2. Uncomplicated LEFT total knee prosthesis. 3. Thickening of the patellar tendons bilaterally, suspicious for tendinitis. Electronically Signed   By: Aliene Lloyd M.D.   On: 10/08/2023 12:24   DG Knee Complete 4 Views Left Result Date: 10/08/2023  CLINICAL DATA:  Diffuse pain status post fall. EXAM: RIGHT KNEE - COMPLETE 4+ VIEW; LEFT KNEE - COMPLETE 4+ VIEW COMPARISON:  11/22/2014 FINDINGS: LEFT KNEE: Total knee prosthesis is well seated without periprosthetic fracture or lucency. There is thickening of the patellar tendon. RIGHT KNEE: Moderate degenerative changes of the medial and patellofemoral compartments. Mild degenerative changes of the lateral compartment. No fracture or dislocation. Mild thickening of the patellar tendon. IMPRESSION: 1. No acute fracture or dislocation of the knees. 2. Uncomplicated LEFT total knee prosthesis. 3. Thickening of the patellar tendons bilaterally, suspicious for tendinitis. Electronically Signed   By: Aliene Lloyd M.D.   On: 10/08/2023 12:24   DG Hand Complete Right Result Date: 10/08/2023 EXAM: 3 or more VIEW(S) XRAY OF THE RIGHT HAND 10/08/2023 11:38:00 AM COMPARISON: Right finger series dated 03/03/2017. CLINICAL HISTORY: Fall, diffuse pain. Pt fell over a piece of farm equipment. Hit his forehead on the tongue of the equipment. Does take eliquis . Is hurting in both knees, across both shoulders and in the right hand. FINDINGS: BONES AND JOINTS: No acute fracture. No focal osseous lesion. No joint dislocation. Mild osteoarthritis. SOFT TISSUES: The soft tissues are unremarkable. IMPRESSION: 1. No acute  osseous abnormality. 2. Mild osteoarthritis. Electronically signed by: Evalene Coho MD 10/08/2023 12:21 PM EDT RP Workstation: [id number]   CT Thoracic Spine Wo Contrast Result Date: 10/08/2023 EXAM: CT THORACIC SPINE WITHOUT CONTRAST 10/08/2023 12:05:11 PM TECHNIQUE: CT of the thoracic spine was performed without the administration of intravenous contrast. Multiplanar reformatted images are provided for review. Automated exposure control, iterative reconstruction, and/or weight based adjustment of the mA/kV was utilized to reduce the radiation dose to as low as reasonably achievable. COMPARISON: None available. CLINICAL HISTORY: Ataxia, thoracic trauma. Pt fell over a piece of farm equipment. Hit his forehead on the tongue of the equipment. Does take eliquis . Is hurting in both knees, across both shoulders and in the right hand. FINDINGS: BONES AND ALIGNMENT: Mildly exaggerated thoracic kyphosis. No significant listhesis. No acute fracture or destructive process. DEGENERATIVE CHANGES: Bridging anterior vertebral osteophytes throughout the majority of the thoracic spine. No evidence of high-grade stenosis. SOFT TISSUES: Please see the contemporaneous chest CT for assessment of the thoracic soft tissues and lungs. IMPRESSION: 1. No acute osseous abnormality in the thoracic spine. Electronically signed by: Dasie Hamburg MD 10/08/2023 12:19 PM EDT RP Workstation: [id number]   CT Cervical Spine Wo Contrast Result Date: 10/08/2023 EXAM: CT CERVICAL SPINE WITHOUT CONTRAST 10/08/2023 12:05:11 PM TECHNIQUE: CT of the cervical spine was performed without the administration of intravenous contrast. Multiplanar reformatted images are provided for review. Automated exposure control, iterative reconstruction, and/or weight based adjustment of the mA/kV was utilized to reduce the radiation dose to as low as reasonably achievable. COMPARISON: None available. CLINICAL HISTORY: Ataxia, cervical trauma. Pt fell over a piece  of farm equipment. Hit his forehead on the tongue of the equipment. Does take eliquis . Is hurting in both knees, across both shoulders and in the right hand. FINDINGS: CERVICAL SPINE: BONES AND ALIGNMENT: Straightening of the normal cervical lordosis. Trace anterolisthesis of C4 on C5 and C7 on T1. Partial fusion across the disc spaces and facets at C2-3 and C3-4. Nondisplaced fracture of the left C6 transverse process which appears chronic on coronal reformats. No definite acute fracture or destructive process. DEGENERATIVE CHANGES: Moderate disc degeneration at C5-6 and severe degeneration at C6-7. Widespread advanced facet arthrosis. SOFT TISSUES: No prevertebral soft tissue swelling. IMPRESSION: 1. No acute cervical spine fracture identified. 2.  Chronic findings as above. Electronically signed by: Dasie Hamburg MD 10/08/2023 12:15 PM EDT RP Workstation: [id number]   CT Head Wo Contrast Result Date: 10/08/2023 EXAM: CT HEAD WITHOUT CONTRAST 10/08/2023 12:05:11 PM TECHNIQUE: CT of the head was performed without the administration of intravenous contrast. Automated exposure control, iterative reconstruction, and/or weight based adjustment of the mA/kV was utilized to reduce the radiation dose to as low as reasonably achievable. COMPARISON: None available. CLINICAL HISTORY: Head trauma, minor (Age >= 65y). Pt fell over a piece of farm equipment. Hit his forehead on the tongue of the equipment. Does take eliquis . Is hurting in both knees, across both shoulders and in the right hand. FINDINGS: BRAIN AND VENTRICLES: No acute hemorrhage. Gray-white differentiation is preserved. No hydrocephalus. No extra-axial collection. No mass effect or midline shift. Minimal cerebral white matter hypodensities, nonspecific but compatible with chronic small vessel ischemic disease. ORBITS: Bilateral cataract extraction. SINUSES: No acute abnormality. SOFT TISSUES AND SKULL: No acute soft tissue abnormality. No skull fracture.  Calcified atherosclerosis at the skull base. IMPRESSION: 1. No acute intracranial abnormality. Electronically signed by: Dasie Hamburg MD 10/08/2023 12:08 PM EDT RP Workstation: [id number]     Procedures   Medications Ordered in the ED - No data to display                                  Medical Decision Making Amount and/or Complexity of Data Reviewed Labs: ordered. Radiology: ordered.   This patient presents to the ED for concern of headache, neck pain, thoracic back pain, bilateral knee pain, right hand pain differential diagnosis includes intracranial hemorrhage, closed head injury, concussion, vertebral fracture, lumbar joint fracture, contusion, abrasion, muscle strain    Additional history obtained:  Additional history obtained from family External records from outside source obtained and reviewed including records   Lab Tests:  I Ordered, and personally interpreted labs.  The pertinent results include: No leukocytosis, no anemia, normal kidney function, unremarkable electrolytes   Imaging Studies ordered:  I ordered imaging studies including CT scan head, cervical spine, thoracic spine, chest, x-ray of bilateral knees and right hand I independently visualized and interpreted imaging which showed no acute intracranial hemorrhage, no vertebral fracture, no acute traumatic process within the chest, no acute osseous injury lesions of bilateral knees and right hand I agree with the radiologist interpretation    Problem List / ED Course:  Patient is doing well at this time and is stable for discharge home.  Discussed with patient that all workup in emergency department has been unremarkable.  CT scan of the head, cervical spine, thoracic spine, chest was unremarked for any signs of acute traumatic injury.  Patient had no acute traumatic process noted on x-rays of bilateral knees and right hand.  He had no other long bone or joint tenderness noted on physical exam.  He was  nontender to palpation over chest wall and abdomen.  He had no concern neurological deficits on exam.  Do not specked any further advanced imaging is warranted at this time.  Close follow-up with PCP was discussed as well as strict return precautions for any new or worsening symptoms.  Patient and family voiced understanding and had no additional questions.   Social Determinants of Health:  None        Final diagnoses:  None    ED Discharge Orders     None  Daralene Lonni BIRCH, PA-C 10/08/23 1313    Yolande Lamar BROCKS, MD 10/13/23 1009

## 2023-10-08 NOTE — ED Triage Notes (Signed)
 Pt fell over a piece of farm equipment. Hit his forehead on the tongue of the equipment. Does take eliquis . Is hurting in both knees, across both shoulders and in the right hand.

## 2023-12-17 DIAGNOSIS — N2889 Other specified disorders of kidney and ureter: Secondary | ICD-10-CM | POA: Diagnosis not present

## 2023-12-17 DIAGNOSIS — R918 Other nonspecific abnormal finding of lung field: Secondary | ICD-10-CM | POA: Diagnosis not present

## 2023-12-17 DIAGNOSIS — D369 Benign neoplasm, unspecified site: Secondary | ICD-10-CM | POA: Diagnosis not present

## 2023-12-17 DIAGNOSIS — C221 Intrahepatic bile duct carcinoma: Secondary | ICD-10-CM | POA: Diagnosis not present

## 2023-12-25 ENCOUNTER — Ambulatory Visit: Payer: PPO | Admitting: Family Medicine

## 2023-12-25 ENCOUNTER — Encounter: Payer: Self-pay | Admitting: Family Medicine

## 2023-12-25 VITALS — BP 133/76 | HR 64 | Ht 73.0 in | Wt 229.0 lb

## 2023-12-25 DIAGNOSIS — I10 Essential (primary) hypertension: Secondary | ICD-10-CM | POA: Diagnosis not present

## 2023-12-25 DIAGNOSIS — N400 Enlarged prostate without lower urinary tract symptoms: Secondary | ICD-10-CM | POA: Diagnosis not present

## 2023-12-25 DIAGNOSIS — Z0001 Encounter for general adult medical examination with abnormal findings: Secondary | ICD-10-CM | POA: Diagnosis not present

## 2023-12-25 DIAGNOSIS — R739 Hyperglycemia, unspecified: Secondary | ICD-10-CM | POA: Diagnosis not present

## 2023-12-25 DIAGNOSIS — Z23 Encounter for immunization: Secondary | ICD-10-CM

## 2023-12-25 DIAGNOSIS — E782 Mixed hyperlipidemia: Secondary | ICD-10-CM

## 2023-12-25 DIAGNOSIS — Z0000 Encounter for general adult medical examination without abnormal findings: Secondary | ICD-10-CM

## 2023-12-25 LAB — BAYER DCA HB A1C WAIVED: HB A1C (BAYER DCA - WAIVED): 6.5 % — ABNORMAL HIGH (ref 4.8–5.6)

## 2023-12-25 MED ORDER — TAMSULOSIN HCL 0.4 MG PO CAPS: 0.4000 mg | ORAL_CAPSULE | Freq: Every day | ORAL | 3 refills | Status: AC

## 2023-12-25 NOTE — Progress Notes (Signed)
 BP 133/76   Pulse 64   Ht 6' 1 (1.854 m)   Wt 229 lb (103.9 kg)   SpO2 98%   BMI 30.21 kg/m    Subjective:   Patient ID: Grant VEAR Neysa Mickey., male    DOB: 1946-01-20, 78 y.o.   MRN: [id number]  HPI: Grant Schexnayder. is a 78 y.o. male presenting on 12/25/2023 for Medical Management of Chronic Issues (CPE), Hyperlipidemia, Hypertension, and increased urination   Discussed the use of AI scribe software for clinical note transcription with the patient, who gave verbal consent to proceed.  History of Present Illness   Grant Cummings is a 78 year old male who presents for an annual physical exam.  Hypertension management - Intermittent arthritis symptoms present - Currently taking hydrochlorothiazide , amlodipine , and carvedilol  for blood pressure control - Blood pressure measured at 133/76 mmHg - Carvedilol  taken once daily - Blood pressure regimen stable  Anticoagulation and bleeding symptoms - Continues Eliquis  therapy due to prior blood clots - Prolonged bleeding with cuts - No hematuria or melena - No significant bleeding issues aside from prolonged bleeding with cuts  Hyperlipidemia management - Taking Zetia  for cholesterol management - Zetia  taken at night  Lower urinary tract symptoms - Taking nightly medication for prostate issues - No significant improvement in urinary symptoms - Limits evening fluid intake - Urinates once per night, which is an improvement - Experiences urgency but maintains good urinary flow  Fall and glycemic monitoring - Previous fall occurred while moving equipment, resulting in ER visit - Elevated blood sugar noted at time of fall - Currently monitoring blood sugar levels  Recent laboratory findings - Recent blood work showed no cancer markers - Hemoglobin levels within normal range       Relevant past medical, surgical, family and social history reviewed and updated as indicated. Interim medical history since our  last visit reviewed. Allergies and medications reviewed and updated.  Review of Systems  Constitutional:  Negative for chills and fever.  HENT:  Negative for ear pain and tinnitus.   Eyes:  Negative for pain.  Respiratory:  Negative for cough, shortness of breath and wheezing.   Cardiovascular:  Negative for chest pain, palpitations and leg swelling.  Gastrointestinal:  Negative for abdominal pain, blood in stool, constipation and diarrhea.  Genitourinary:  Positive for frequency. Negative for dysuria and hematuria.  Musculoskeletal:  Negative for back pain and myalgias.  Skin:  Negative for rash.  Neurological:  Negative for dizziness, weakness and headaches.  Psychiatric/Behavioral:  Negative for suicidal ideas. The patient is not nervous/anxious.     Per HPI unless specifically indicated above   Allergies as of 12/25/2023       Reactions   Nsaids Other (See Comments)   Stomach bleed from radiation        Medication List        Accurate as of December 25, 2023 10:17 AM. If you have any questions, ask your nurse or doctor.          acetaminophen  500 MG tablet Commonly known as: TYLENOL  Take 500-1,000 mg by mouth every 6 (six) hours as needed (for knee pain.).   Allergy Eye 0.025-0.3 % ophthalmic solution Generic drug: naphazoline-pheniramine 1-2 drops 4 (four) times daily as needed for eye irritation or allergies.   amLODipine  10 MG tablet Commonly known as: NORVASC  Take 1 tablet (10 mg total) by mouth daily.   apixaban  5 MG Tabs tablet Commonly known as:  Eliquis  Take 1 tablet (5 mg total) by mouth 2 (two) times daily.   carvedilol  12.5 MG tablet Commonly known as: COREG  Take 1 tablet (12.5 mg total) by mouth 2 (two) times daily with a meal.   cholecalciferol 1000 units tablet Commonly known as: VITAMIN D  Take 1,000 Units by mouth daily.   ezetimibe  10 MG tablet Commonly known as: Zetia  Take 1 tablet (10 mg total) by mouth daily. Take at night    fluticasone  50 MCG/ACT nasal spray Commonly known as: FLONASE  Place 1-2 sprays into both nostrils daily as needed for allergies or rhinitis.   hydrochlorothiazide  25 MG tablet Commonly known as: HYDRODIURIL  Take 1 tablet (25 mg total) by mouth daily.   multivitamin with minerals Tabs tablet Take 1 tablet by mouth every evening.   pantoprazole  40 MG tablet Commonly known as: PROTONIX  Take 1 tablet (40 mg total) by mouth daily.   potassium chloride  10 MEQ tablet Commonly known as: KLOR-CON  M Take 1 tablet (10 mEq total) by mouth daily.   tamsulosin  0.4 MG Caps capsule Commonly known as: FLOMAX  Take 1 capsule (0.4 mg total) by mouth daily.         Objective:   BP 133/76   Pulse 64   Ht 6' 1 (1.854 m)   Wt 229 lb (103.9 kg)   SpO2 98%   BMI 30.21 kg/m   Wt Readings from Last 3 Encounters:  12/25/23 229 lb (103.9 kg)  10/08/23 232 lb (105.2 kg)  08/27/23 232 lb (105.2 kg)    Physical Exam Physical Exam   VITALS: BP- 133/76 HEENT: Ears without cerumen impaction. Throat clear. NECK: Thyroid without masses. CHEST: Lungs clear to auscultation bilaterally. CARDIOVASCULAR: Regular heart sounds, no murmurs. EXTREMITIES: No edema in legs, good pulses bilaterally.         Assessment & Plan:   Problem List Items Addressed This Visit       Cardiovascular and Mediastinum   Hypertension   Relevant Orders   CBC with Differential/Platelet   CMP14+EGFR     Genitourinary   BPH (benign prostatic hyperplasia)   Relevant Medications   tamsulosin  (FLOMAX ) 0.4 MG CAPS capsule   Other Relevant Orders   PSA, total and free     Other   Hyperlipidemia   Relevant Orders   CMP14+EGFR   Lipid panel   Other Visit Diagnoses       Physical exam    -  Primary     Elevated blood sugar       Relevant Orders   Bayer DCA Hb A1c Waived          Adult Wellness Visit Routine visit with no acute concerns. Blood pressure controlled. Asymptomatic hernia. - Continue  current medications: hydrochlorothiazide , amlodipine , carvedilol , Eliquis , Zetia . - Ordered blood work: A1c, PSA, cholesterol, kidney, liver function tests. - Scheduled colonoscopy in April.  Essential hypertension Blood pressure well-controlled with current regimen. - Continue current antihypertensive medications: hydrochlorothiazide , amlodipine , carvedilol .  Mixed hyperlipidemia Continues on Zetia  for cholesterol management. - Continue Zetia  for cholesterol management.  Benign prostatic hyperplasia with nocturia Nocturia with some improvement by reducing fluid intake. Current medication may not be fully effective. Discussed caffeine impact. - Reduce caffeine intake, including decaf tea. - Consider urologist referral if symptoms persist.  Hyperglycemia Previous elevated blood sugar at 130 mg/dL. A1c will assess for diabetes. - Ordered A1c test to evaluate blood sugar control.  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 No bleeding complications observed. - Continue Eliquis  for anticoagulation.  Follow up plan: Return in about 6 months (around 06/23/2024), or if symptoms worsen or fail to improve, for Recheck chronic medical issue.  Counseling provided for all of the vaccine components Orders Placed This Encounter  Procedures   CBC with Differential/Platelet   CMP14+EGFR   Lipid panel   Bayer DCA Hb A1c Waived   PSA, total and free    Fonda Levins, MD [facility] 12/25/2023, 10:17 AM

## 2023-12-26 LAB — LIPID PANEL
Chol/HDL Ratio: 4.2 ratio (ref 0.0–5.0)
Cholesterol, Total: 160 mg/dL (ref 100–199)
HDL: 38 mg/dL — ABNORMAL LOW (ref >39)
LDL Chol Calc (NIH): 90 mg/dL (ref 0–99)
Triglycerides: 185 mg/dL — ABNORMAL HIGH (ref 0–149)
VLDL Cholesterol Cal: 32 mg/dL (ref 5–40)

## 2023-12-26 LAB — CBC WITH DIFFERENTIAL/PLATELET
Basophils Absolute: 0 x10E3/uL (ref 0.0–0.2)
Basos: 0 %
EOS (ABSOLUTE): 0.1 x10E3/uL (ref 0.0–0.4)
Eos: 1 %
Hematocrit: 40.4 % (ref 37.5–51.0)
Hemoglobin: 12.8 g/dL — ABNORMAL LOW (ref 13.0–17.7)
Immature Grans (Abs): 0 x10E3/uL (ref 0.0–0.1)
Immature Granulocytes: 0 %
Lymphocytes Absolute: 0.7 x10E3/uL (ref 0.7–3.1)
Lymphs: 13 %
MCH: 30.1 pg (ref 26.6–33.0)
MCHC: 31.7 g/dL (ref 31.5–35.7)
MCV: 95 fL (ref 79–97)
Monocytes Absolute: 0.5 x10E3/uL (ref 0.1–0.9)
Monocytes: 9 %
Neutrophils Absolute: 4 x10E3/uL (ref 1.4–7.0)
Neutrophils: 77 %
Platelets: 188 x10E3/uL (ref 150–450)
RBC: 4.25 x10E6/uL (ref 4.14–5.80)
RDW: 14.1 % (ref 11.6–15.4)
WBC: 5.3 x10E3/uL (ref 3.4–10.8)

## 2023-12-26 LAB — CMP14+EGFR
ALT: 24 IU/L (ref 0–44)
AST: 21 IU/L (ref 0–40)
Albumin: 4.1 g/dL (ref 3.8–4.8)
Alkaline Phosphatase: 61 IU/L (ref 47–123)
BUN/Creatinine Ratio: 18 (ref 10–24)
BUN: 21 mg/dL (ref 8–27)
Bilirubin Total: 0.5 mg/dL (ref 0.0–1.2)
CO2: 23 mmol/L (ref 20–29)
Calcium: 9.1 mg/dL (ref 8.6–10.2)
Chloride: 103 mmol/L (ref 96–106)
Creatinine, Ser: 1.17 mg/dL (ref 0.76–1.27)
Globulin, Total: 2.1 g/dL (ref 1.5–4.5)
Glucose: 138 mg/dL — ABNORMAL HIGH (ref 70–99)
Potassium: 4.2 mmol/L (ref 3.5–5.2)
Sodium: 141 mmol/L (ref 134–144)
Total Protein: 6.2 g/dL (ref 6.0–8.5)
eGFR: 64 mL/min/1.73 (ref >59)

## 2023-12-26 LAB — PSA, TOTAL AND FREE
PSA, Free Pct: 16.7 %
PSA, Free: 0.05 ng/mL
Prostate Specific Ag, Serum: 0.3 ng/mL (ref 0.0–4.0)

## 2024-01-01 ENCOUNTER — Ambulatory Visit: Payer: Self-pay | Admitting: Family Medicine

## 2024-02-27 ENCOUNTER — Ambulatory Visit: Payer: PPO

## 2024-02-27 VITALS — BP 133/67 | HR 64 | Ht 73.0 in | Wt 229.0 lb

## 2024-02-27 DIAGNOSIS — Z0000 Encounter for general adult medical examination without abnormal findings: Secondary | ICD-10-CM

## 2024-02-27 NOTE — Progress Notes (Signed)
 "  Chief Complaint  Patient presents with   Medicare Wellness     Subjective:   Grant Cummings. is a 79 y.o. male who presents for a Medicare Annual Wellness Visit.  Visit info / Clinical Intake: Medicare Wellness Visit Type:: Subsequent Annual Wellness Visit Persons participating in visit and providing information:: patient Medicare Wellness Visit Mode:: Telephone If telephone:: video declined Since this visit was completed virtually, some vitals may be partially provided or unavailable. Missing vitals are due to the limitations of the virtual format.: Unable to obtain vitals - no equipment If Telephone or Video please confirm:: I connected with patient using audio/video enable telemedicine. I verified patient identity with two identifiers, discussed telehealth limitations, and patient agreed to proceed. Patient Location:: home Provider Location:: office Interpreter Needed?: No Pre-visit prep was completed: yes AWV questionnaire completed by patient prior to visit?: no Living arrangements:: lives with spouse/significant other Patient's Overall Health Status Rating: very good Typical amount of pain: none Does pain affect daily life?: no Are you currently prescribed opioids?: no  Dietary Habits and Nutritional Risks How many meals a day?: 3 Eats fruit and vegetables daily?: yes Most meals are obtained by: preparing own meals In the last 2 weeks, have you had any of the following?: none Diabetic:: no  Functional Status Activities of Daily Living (to include ambulation/medication): Independent Ambulation: Independent Medication Administration: Independent Home Management (perform basic housework or laundry): Independent Manage your own finances?: yes Primary transportation is: driving Concerns about vision?: no *vision screening is required for [facility]* (last ov 2025) Concerns about hearing?: (!) yes Uses hearing aids?: (!) yes  Fall Screening Falls in the past year?:  0 Number of falls in past year: 0 Was there an injury with Fall?: 0 Fall Risk Category Calculator: 0 Patient Fall Risk Level: Low Fall Risk  Fall Risk Patient at Risk for Falls Due to: No Fall Risks Fall risk Follow up: Falls evaluation completed; Education provided  Home and Transportation Safety: All rugs have non-skid backing?: yes All stairs or steps have railings?: yes Grab bars in the bathtub or shower?: yes Have non-skid surface in bathtub or shower?: yes Good home lighting?: yes Regular seat belt use?: yes Hospital stays in the last year:: no  Cognitive Assessment Difficulty concentrating, remembering, or making decisions? : no Will 6CIT or Mini Cog be Completed: yes What year is it?: 0 points What month is it?: 0 points Give patient an address phrase to remember (5 components): [address] [place], [place] Count backwards from 20 to 1: 0 points Say the months of the year in reverse: 0 points  Advance Directives (For Healthcare) Does Patient Have a Medical Advance Directive?: Yes Type of Advance Directive: Healthcare Power of [place]; Living will Copy of Healthcare Power of Attorney in Chart?: Yes - validated most recent copy scanned in chart (See row information) Copy of Living Will in Chart?: Yes - validated most recent copy scanned in chart (See row information)  Reviewed/Updated  Reviewed/Updated: Reviewed All (Medical, Surgical, Family, Medications, Allergies, Care Teams, Patient Goals); Medical History; Surgical History; Family History; Medications; Allergies; Care Teams; Patient Goals    Allergies (verified) Nsaids   Current Medications (verified) Outpatient Encounter Medications as of 02/27/2024  Medication Sig   acetaminophen  (TYLENOL ) 500 MG tablet Take 500-1,000 mg by mouth every 6 (six) hours as needed (for knee pain.).   amLODipine  (NORVASC ) 10 MG tablet Take 1 tablet (10 mg total) by mouth daily.   apixaban  (ELIQUIS ) 5 MG  TABS tablet Take 1 tablet (5  mg total) by mouth 2 (two) times daily.   carvedilol  (COREG ) 12.5 MG tablet Take 1 tablet (12.5 mg total) by mouth 2 (two) times daily with a meal.   cholecalciferol (VITAMIN D ) 1000 UNITS tablet Take 1,000 Units by mouth daily.   ezetimibe  (ZETIA ) 10 MG tablet Take 1 tablet (10 mg total) by mouth daily. Take at night   fluticasone  (FLONASE ) 50 MCG/ACT nasal spray Place 1-2 sprays into both nostrils daily as needed for allergies or rhinitis.   hydrochlorothiazide  (HYDRODIURIL ) 25 MG tablet Take 1 tablet (25 mg total) by mouth daily.   Multiple Vitamin (MULTIVITAMIN WITH MINERALS) TABS tablet Take 1 tablet by mouth every evening.   naphazoline-pheniramine (ALLERGY EYE) 0.025-0.3 % ophthalmic solution 1-2 drops 4 (four) times daily as needed for eye irritation or allergies.   pantoprazole  (PROTONIX ) 40 MG tablet Take 1 tablet (40 mg total) by mouth daily.   potassium chloride  (KLOR-CON  M) 10 MEQ tablet Take 1 tablet (10 mEq total) by mouth daily.   tamsulosin  (FLOMAX ) 0.4 MG CAPS capsule Take 1 capsule (0.4 mg total) by mouth daily.   No facility-administered encounter medications on file as of 02/27/2024.    History: Past Medical History:  Diagnosis Date   Anemia    Arthritis    Asbestos exposure    Cataract    GERD (gastroesophageal reflux disease)    Hyperlipidemia    Hypertension    Liver cancer (HCC)    cholangeocarcinoma   Saddle pulmonary embolus (HCC)    Past Surgical History:  Procedure Laterality Date   APPENDECTOMY     CARPAL TUNNEL WITH CUBITAL TUNNEL Right    CHOLECYSTECTOMY     EYE SURGERY Left 2017   lens implant to correct stigmatism / cataract   KNEE SURGERY     left   MELANOMA EXCISION Left 01/17/2020   partial liver removal     x 3   RADIOFREQUENCY ABLATION LIVER TUMOR     with biopsy  - NEG   TOTAL KNEE ARTHROPLASTY Left 07/16/2021   Procedure: TOTAL KNEE ARTHROPLASTY;  Surgeon: Melodi Lerner, MD;  Location: [facility];  Service: Orthopedics;  Laterality: Left;    Family History  Problem Relation Age of Onset   Alzheimer's disease Mother    Cancer Mother 73       breast   Breast cancer Mother    Alzheimer's disease Father    Cancer Father        lymph nodes- jaw   Hypertension Father    Heart disease Father        heart attack    Cancer Sister        ovarian   Hypertension Sister    GI problems Sister    Melanoma Daughter 104   Clotting disorder Paternal Grandfather    Social History   Occupational History   Occupation: retired    Comment: Health And Safety Inspector   Occupation: Farm  Tobacco Use   Smoking status: Never    Passive exposure: Yes   Smokeless tobacco: Never  Vaping Use   Vaping status: Never Used  Substance and Sexual Activity   Alcohol use: No   Drug use: No   Sexual activity: Yes   Tobacco Counseling Counseling given: Yes  SDOH Screenings   Food Insecurity: No Food Insecurity (02/27/2024)  Housing: Unknown (02/27/2024)  Transportation Needs: No Transportation Needs (02/27/2024)  Utilities: Not At Risk (02/27/2024)  Alcohol Screen: Low Risk (02/26/2023)  Depression (  PHQ2-9): Low Risk (02/27/2024)  Financial Resource Strain: Low Risk (02/26/2023)  Physical Activity: Sufficiently Active (02/27/2024)  Social Connections: Socially Integrated (02/27/2024)  Stress: No Stress Concern Present (02/27/2024)  Tobacco Use: Medium Risk (02/27/2024)  Health Literacy: Adequate Health Literacy (02/27/2024)   See flowsheets for full screening details  Depression Screen PHQ 2 & 9 Depression Scale- Over the past 2 weeks, how often have you been bothered by any of the following problems? Little interest or pleasure in doing things: 0 Feeling down, depressed, or hopeless (PHQ Adolescent also includes...irritable): 0 PHQ-2 Total Score: 0 Trouble falling or staying asleep, or sleeping too much: 0 Feeling tired or having little energy: 0 Poor appetite or overeating (PHQ Adolescent also includes...weight loss): 0 Feeling bad about yourself - or  that you are a failure or have let yourself or your family down: 0 Trouble concentrating on things, such as reading the newspaper or watching television (PHQ Adolescent also includes...like school work): 0 Moving or speaking so slowly that other people could have noticed. Or the opposite - being so fidgety or restless that you have been moving around a lot more than usual: 0 Thoughts that you would be better off dead, or of hurting yourself in some way: 0 PHQ-9 Total Score: 0 If you checked off any problems, how difficult have these problems made it for you to do your work, take care of things at home, or get along with other people?: Not difficult at all     Goals Addressed             This Visit's Progress    Increase physical activity   On track            Objective:    Today's Vitals   02/27/24 1244  BP: 133/67  Pulse: 64  Weight: 229 lb (103.9 kg)  Height: 6' 1 (1.854 m)   Body mass index is 30.21 kg/m.  Hearing/Vision screen No results found. Immunizations and Health Maintenance Health Maintenance  Topic Date Due   COVID-19 Vaccine (4 - 2025-26 season) 10/13/2023   Zoster Vaccines- Shingrix (1 of 2) 03/26/2024 (Originally 02/23/1964)   Hepatitis C Screening  06/18/2024 (Originally 02/24/1963)   Medicare Annual Wellness (AWV)  02/26/2025   DTaP/Tdap/Td (3 - Td or Tdap) 11/30/2026   Pneumococcal Vaccine: 50+ Years  Completed   Influenza Vaccine  Completed   Meningococcal B Vaccine  Aged Out   Mammogram  Discontinued   Colonoscopy  Discontinued        Assessment/Plan:  This is a routine wellness examination for Remon.  Patient Care Team: Dettinger, Fonda LABOR, MD as PCP - General (Family Medicine) Lavona Agent, MD as PCP - Cardiology (Cardiology) Geralynn Garnette BIRCH, MD as Consulting Physician (Specialist) Karlynn Font, Tino HERO, MD as Referring Physician (Hematology and Oncology) Debora Kiang, MD as Referring Physician (Surgery) Eda Baxter, MD as  Referring Physician (Dermatology) Dr [facility], OD (Optometry) Gladis Husband, MD as Referring Physician (Dermatology)  I have personally reviewed and noted the following in the patients chart:   Medical and social history Use of alcohol, tobacco or illicit drugs  Current medications and supplements including opioid prescriptions. Functional ability and status Nutritional status Physical activity Advanced directives List of other physicians Hospitalizations, surgeries, and ER visits in previous 12 months Vitals Screenings to include cognitive, depression, and falls Referrals and appointments  No orders of the defined types were placed in this encounter.  In addition, I have reviewed and discussed  with patient certain preventive protocols, quality metrics, and best practice recommendations. A written personalized care plan for preventive services as well as general preventive health recommendations were provided to patient.   Ozie Ned, CMA   02/27/2024   Return in 1 year (on 02/26/2025).  After Visit Summary: (MyChart) Due to this being a telephonic visit, the after visit summary with patients personalized plan was offered to patient via MyChart   Nurse Notes: per pt declined Covid vaccine "

## 2024-02-27 NOTE — Patient Instructions (Signed)
 Grant Cummings,  Thank you for taking the time for your Medicare Wellness Visit. I appreciate your continued commitment to your health goals. Please review the care plan we discussed, and feel free to reach out if I can assist you further.  Please note that Annual Wellness Visits do not include a physical exam. Some assessments may be limited, especially if the visit was conducted virtually. If needed, we may recommend an in-person follow-up with your provider.  Ongoing Care Seeing your primary care provider every 3 to 6 months helps us  monitor your health and provide consistent, personalized care.   Referrals If a referral was made during today's visit and you haven't received any updates within two weeks, please contact the referred provider directly to check on the status.  Recommended Screenings:  Health Maintenance  Topic Date Due   COVID-19 Vaccine (4 - 2025-26 season) 10/13/2023   Medicare Annual Wellness Visit  02/26/2024   Zoster (Shingles) Vaccine (1 of 2) 03/26/2024*   Hepatitis C Screening  06/18/2024*   DTaP/Tdap/Td vaccine (3 - Td or Tdap) 11/30/2026   Pneumococcal Vaccine for age over 12  Completed   Flu Shot  Completed   Meningitis B Vaccine  Aged Out   Breast Cancer Screening  Discontinued   Colon Cancer Screening  Discontinued  *Topic was postponed. The date shown is not the original due date.       02/27/2024   12:46 PM  Advanced Directives  Does Patient Have a Medical Advance Directive? Yes  Type of Estate Agent of [place];Living will  Copy of Healthcare Power of Attorney in Chart? Yes - validated most recent copy scanned in chart (See row information)    Vision: Annual vision screenings are recommended for early detection of glaucoma, cataracts, and diabetic retinopathy. These exams can also reveal signs of chronic conditions such as diabetes and high blood pressure.  Dental: Annual dental screenings help detect early signs of oral cancer,  gum disease, and other conditions linked to overall health, including heart disease and diabetes.  Please see the attached documents for additional preventive care recommendations.

## 2024-06-24 ENCOUNTER — Ambulatory Visit: Admitting: Family Medicine

## 2024-12-29 ENCOUNTER — Encounter: Admitting: Family Medicine

## 2025-03-01 ENCOUNTER — Ambulatory Visit
